# Patient Record
Sex: Female | Born: 1960 | Race: Black or African American | Hispanic: No | Marital: Single | Smoking: Current every day smoker
Health system: Southern US, Community
[De-identification: ages and names within clinical notes are randomized; demographics above are authoritative.]

## PROBLEM LIST (undated history)

## (undated) DIAGNOSIS — R911 Solitary pulmonary nodule: Secondary | ICD-10-CM

## (undated) DIAGNOSIS — I700 Atherosclerosis of aorta: Secondary | ICD-10-CM

## (undated) DIAGNOSIS — K8020 Calculus of gallbladder without cholecystitis without obstruction: Secondary | ICD-10-CM

## (undated) DIAGNOSIS — K219 Gastro-esophageal reflux disease without esophagitis: Secondary | ICD-10-CM

## (undated) DIAGNOSIS — J439 Emphysema, unspecified: Secondary | ICD-10-CM

## (undated) DIAGNOSIS — I517 Cardiomegaly: Secondary | ICD-10-CM

## (undated) DIAGNOSIS — D649 Anemia, unspecified: Secondary | ICD-10-CM

## (undated) DIAGNOSIS — N200 Calculus of kidney: Secondary | ICD-10-CM

## (undated) HISTORY — PX: LITHOTRIPSY: SUR834

---

## 2006-03-10 ENCOUNTER — Ambulatory Visit: Payer: Self-pay

## 2006-07-21 ENCOUNTER — Emergency Department: Payer: Self-pay

## 2006-07-24 ENCOUNTER — Ambulatory Visit: Payer: Self-pay | Admitting: Urology

## 2008-01-17 ENCOUNTER — Emergency Department: Payer: Self-pay | Admitting: Emergency Medicine

## 2011-01-07 ENCOUNTER — Emergency Department: Payer: Self-pay | Admitting: Emergency Medicine

## 2011-02-16 ENCOUNTER — Ambulatory Visit: Payer: Self-pay | Admitting: Orthopedic Surgery

## 2013-08-14 ENCOUNTER — Emergency Department: Payer: Self-pay | Admitting: Internal Medicine

## 2013-11-04 ENCOUNTER — Emergency Department: Payer: Self-pay | Admitting: Emergency Medicine

## 2013-11-04 LAB — URINALYSIS, COMPLETE
Bacteria: NONE SEEN
Bilirubin,UR: NEGATIVE
GLUCOSE, UR: NEGATIVE mg/dL (ref 0–75)
Ketone: NEGATIVE
Leukocyte Esterase: NEGATIVE
NITRITE: NEGATIVE
Ph: 5 (ref 4.5–8.0)
Protein: NEGATIVE
RBC,UR: 100 /HPF (ref 0–5)
Specific Gravity: 1.013 (ref 1.003–1.030)

## 2013-11-04 LAB — COMPREHENSIVE METABOLIC PANEL
ALT: 19 U/L (ref 12–78)
ANION GAP: 4 — AB (ref 7–16)
Albumin: 3.3 g/dL — ABNORMAL LOW (ref 3.4–5.0)
Alkaline Phosphatase: 87 U/L
BUN: 15 mg/dL (ref 7–18)
Bilirubin,Total: 0.3 mg/dL (ref 0.2–1.0)
Calcium, Total: 8.2 mg/dL — ABNORMAL LOW (ref 8.5–10.1)
Chloride: 111 mmol/L — ABNORMAL HIGH (ref 98–107)
Co2: 26 mmol/L (ref 21–32)
Creatinine: 0.77 mg/dL (ref 0.60–1.30)
Glucose: 100 mg/dL — ABNORMAL HIGH (ref 65–99)
Osmolality: 282 (ref 275–301)
Potassium: 3.8 mmol/L (ref 3.5–5.1)
SGOT(AST): 13 U/L — ABNORMAL LOW (ref 15–37)
Sodium: 141 mmol/L (ref 136–145)
Total Protein: 6.7 g/dL (ref 6.4–8.2)

## 2013-11-04 LAB — LIPASE, BLOOD: Lipase: 84 U/L (ref 73–393)

## 2013-11-04 LAB — CBC
HCT: 36.6 % (ref 35.0–47.0)
HGB: 12.3 g/dL (ref 12.0–16.0)
MCH: 30.6 pg (ref 26.0–34.0)
MCHC: 33.6 g/dL (ref 32.0–36.0)
MCV: 91 fL (ref 80–100)
PLATELETS: 272 10*3/uL (ref 150–440)
RBC: 4.01 10*6/uL (ref 3.80–5.20)
RDW: 13.7 % (ref 11.5–14.5)
WBC: 6.6 10*3/uL (ref 3.6–11.0)

## 2014-10-16 ENCOUNTER — Emergency Department: Payer: Self-pay | Admitting: Student

## 2014-10-18 ENCOUNTER — Emergency Department: Payer: Self-pay | Admitting: Emergency Medicine

## 2015-09-16 ENCOUNTER — Emergency Department: Payer: No Typology Code available for payment source

## 2015-09-16 ENCOUNTER — Emergency Department
Admission: EM | Admit: 2015-09-16 | Discharge: 2015-09-16 | Disposition: A | Discharge status: Home / Self Care | Payer: No Typology Code available for payment source | Attending: Emergency Medicine | Admitting: Emergency Medicine

## 2015-09-16 ENCOUNTER — Encounter: Payer: Self-pay | Admitting: Emergency Medicine

## 2015-09-16 DIAGNOSIS — R1031 Right lower quadrant pain: Secondary | ICD-10-CM | POA: Diagnosis present

## 2015-09-16 DIAGNOSIS — N10 Acute tubulo-interstitial nephritis: Secondary | ICD-10-CM | POA: Insufficient documentation

## 2015-09-16 DIAGNOSIS — F17200 Nicotine dependence, unspecified, uncomplicated: Secondary | ICD-10-CM | POA: Insufficient documentation

## 2015-09-16 DIAGNOSIS — R109 Unspecified abdominal pain: Secondary | ICD-10-CM

## 2015-09-16 DIAGNOSIS — N12 Tubulo-interstitial nephritis, not specified as acute or chronic: Secondary | ICD-10-CM

## 2015-09-16 HISTORY — DX: Calculus of kidney: N20.0

## 2015-09-16 LAB — URINALYSIS COMPLETE WITH MICROSCOPIC (ARMC ONLY)
BILIRUBIN URINE: NEGATIVE
Glucose, UA: NEGATIVE mg/dL
Ketones, ur: NEGATIVE mg/dL
NITRITE: NEGATIVE
PH: 6 (ref 5.0–8.0)
Protein, ur: NEGATIVE mg/dL
Specific Gravity, Urine: 1.015 (ref 1.005–1.030)

## 2015-09-16 LAB — CBC WITH DIFFERENTIAL/PLATELET
BASOS ABS: 0 10*3/uL (ref 0–0.1)
BASOS PCT: 0 %
Eosinophils Absolute: 0 10*3/uL (ref 0–0.7)
Eosinophils Relative: 0 %
HEMATOCRIT: 38.3 % (ref 35.0–47.0)
HEMOGLOBIN: 12.8 g/dL (ref 12.0–16.0)
Lymphocytes Relative: 15 %
Lymphs Abs: 1.2 10*3/uL (ref 1.0–3.6)
MCH: 30.2 pg (ref 26.0–34.0)
MCHC: 33.4 g/dL (ref 32.0–36.0)
MCV: 90.5 fL (ref 80.0–100.0)
Monocytes Absolute: 0.4 10*3/uL (ref 0.2–0.9)
Monocytes Relative: 5 %
NEUTROS ABS: 6.6 10*3/uL — AB (ref 1.4–6.5)
NEUTROS PCT: 80 %
Platelets: 287 10*3/uL (ref 150–440)
RBC: 4.23 MIL/uL (ref 3.80–5.20)
RDW: 14.2 % (ref 11.5–14.5)
WBC: 8.4 10*3/uL (ref 3.6–11.0)

## 2015-09-16 LAB — BASIC METABOLIC PANEL
ANION GAP: 8 (ref 5–15)
BUN: 17 mg/dL (ref 6–20)
CALCIUM: 9.3 mg/dL (ref 8.9–10.3)
CO2: 24 mmol/L (ref 22–32)
Chloride: 105 mmol/L (ref 101–111)
Creatinine, Ser: 0.84 mg/dL (ref 0.44–1.00)
Glucose, Bld: 99 mg/dL (ref 65–99)
POTASSIUM: 4.1 mmol/L (ref 3.5–5.1)
Sodium: 137 mmol/L (ref 135–145)

## 2015-09-16 MED ORDER — OXYCODONE-ACETAMINOPHEN 5-325 MG PO TABS: 1.0000 | ORAL_TABLET | Freq: Four times a day (QID) | ORAL | Status: DC | PRN

## 2015-09-16 MED ORDER — MORPHINE SULFATE (PF) 4 MG/ML IV SOLN
4.0000 mg | Freq: Once | INTRAVENOUS | Status: AC
  Administered 2015-09-16: 4 mg via INTRAVENOUS
  Filled 2015-09-16: qty 1

## 2015-09-16 MED ORDER — ACETAMINOPHEN 325 MG PO TABS
650.0000 mg | ORAL_TABLET | Freq: Once | ORAL | Status: AC
  Administered 2015-09-16: 650 mg via ORAL
  Filled 2015-09-16: qty 2

## 2015-09-16 MED ORDER — DEXTROSE 5 % IV SOLN
1.0000 g | Freq: Once | INTRAVENOUS | Status: AC
  Administered 2015-09-16: 1 g via INTRAVENOUS
  Filled 2015-09-16: qty 10

## 2015-09-16 MED ORDER — ONDANSETRON HCL 4 MG/2ML IJ SOLN
4.0000 mg | INTRAMUSCULAR | Status: AC
  Administered 2015-09-16: 4 mg via INTRAVENOUS
  Filled 2015-09-16: qty 2

## 2015-09-16 MED ORDER — KETOROLAC TROMETHAMINE 30 MG/ML IJ SOLN
30.0000 mg | Freq: Once | INTRAMUSCULAR | Status: AC
  Administered 2015-09-16: 30 mg via INTRAVENOUS
  Filled 2015-09-16: qty 1

## 2015-09-16 MED ORDER — CEPHALEXIN 500 MG PO CAPS: 500.0000 mg | ORAL_CAPSULE | Freq: Three times a day (TID) | ORAL | Status: AC

## 2015-09-16 MED ORDER — SODIUM CHLORIDE 0.9 % IV BOLUS (SEPSIS)
1000.0000 mL | Freq: Once | INTRAVENOUS | Status: AC
  Administered 2015-09-16: 1000 mL via INTRAVENOUS

## 2015-09-16 NOTE — ED Provider Notes (Signed)
[facility] Emergency Department Provider Note  ____________________________________________  Time seen: Approximately  AM  I have reviewed the triage vital signs and the nursing notes.   HISTORY  Chief Complaint Flank Pain    HPI Belinda Oneill is a 54 y.o. female with a history of kidney stones is presenting today with 3 days of right flank and right lower quadrant abdominal pain. She says that she has been nauseous earlier today but has not vomited. She describes the pain as sharp and intermittent. She also says that she is having discomfort, frequency and urgency with urination. She says that she has had kidney stones in the past and this feels similar.   Past Medical History  Diagnosis Date  . Kidney stones     There are no active problems to display for this patient.   Past Surgical History  Procedure Laterality Date  . Lithotripsy      No current outpatient prescriptions on file.  Allergies Review of patient's allergies indicates no known allergies.  No family history on file.  Social History Social History  Substance Use Topics  . Smoking status: Current Some Day Smoker -- 0.50 packs/day for 40 years  . Smokeless tobacco: None  . Alcohol Use: Yes     Comment: occ    Review of Systems Constitutional: No fever/chills Eyes: No visual changes. ENT: No sore throat. Cardiovascular: Denies chest pain. Respiratory: Denies shortness of breath. Gastrointestinal: no vomiting.  No diarrhea.  No constipation. Genitourinary: Negative for dysuria. Musculoskeletal: Right lower back pain Skin: Negative for rash. Neurological: Negative for headaches, focal weakness or numbness.  10-point ROS otherwise negative.  ____________________________________________   PHYSICAL EXAM:  VITAL SIGNS: ED Triage Vitals  Enc Vitals Group     BP 09/16/15 0504 121/77 mmHg     Pulse Rate 09/16/15 0504 98     Resp 09/16/15 0504 18     Temp 09/16/15  0504 100.3 F (37.9 C)     Temp Source 09/16/15 0504 Oral     SpO2 09/16/15 0504 100 %     Weight 09/16/15 0504 164 lb (74.39 kg)     Height 09/16/15 0504 5\' 5"  (1.651 m)     Head Cir --      Peak Flow --      Pain Score 09/16/15 0505 10     Pain Loc --      Pain Edu? --      Excl. in GC? --     Constitutional: Alert and oriented. Well appearing and in no acute distress. Eyes: Conjunctivae are normal. PERRL. EOMI. Head: Atraumatic. Nose: No congestion/rhinnorhea. Mouth/Throat: Mucous membranes are moist.   Neck: No stridor.   Cardiovascular: Normal rate, regular rhythm. Grossly normal heart sounds.  Good peripheral circulation. Respiratory: Normal respiratory effort.  No retractions. Lungs CTAB. Gastrointestinal: Soft with right lower quadrant tenderness which is mild. No distention. No abdominal bruits. Right-sided CVA tenderness. Musculoskeletal: No lower extremity tenderness nor edema.  No joint effusions. Neurologic:  Normal speech and language. No gross focal neurologic deficits are appreciated. No gait instability. Skin:  Skin is warm, dry and intact. No rash noted. Psychiatric: Mood and affect are normal. Speech and behavior are normal.  ____________________________________________   LABS (all labs ordered are listed, but only abnormal results are displayed)  Labs Reviewed  CBC WITH DIFFERENTIAL/PLATELET - Abnormal; Notable for the following:    Neutro Abs 6.6 (*)    All other components within normal limits  URINALYSIS  COMPLETEWITH MICROSCOPIC ([facility] ONLY) - Abnormal; Notable for the following:    Color, Urine YELLOW (*)    APPearance CLOUDY (*)    Hgb urine dipstick 2+ (*)    Leukocytes, UA 3+ (*)    Bacteria, UA RARE (*)    Squamous Epithelial / LPF 6-30 (*)    All other components within normal limits  BASIC METABOLIC PANEL   ____________________________________________  EKG   ____________________________________________  RADIOLOGY  Multiple stable  bilateral renal calculi. No acute abnormalities. ____________________________________________   PROCEDURES   ____________________________________________   INITIAL IMPRESSION / ASSESSMENT AND PLAN / ED COURSE  Pertinent labs & imaging results that were available during my care of the patient were reviewed by me and considered in my medical decision making (see chart for details).  ----------------------------------------- 8:56 AM on 09/16/2015 -----------------------------------------  The patient is resting comfortably at this time. I updated her about her imaging results. She'll be discharged with antibiotics for pyelonephritis. I will also give her several tablets of Percocet for pain control. She is not her primary care doctor. She'll be given the contact information for the [place] clinic. We reviewed return instructions such as any worsening or concerning symptoms especially fever or worsening pain. The patient understands the plan and is willing to comply. ____________________________________________   FINAL CLINICAL IMPRESSION(S) / ED DIAGNOSES  Final diagnoses:  Right flank pain   pyelonephritis.  Myrna Blazer, MD 09/16/15 [phone]

## 2015-09-16 NOTE — ED Notes (Signed)
Pt resting, Tylenol given for flank pain.

## 2015-09-16 NOTE — ED Notes (Signed)
Patient here with complaint of right flank pain times one week that became worse this morning. Patient reports that she has had nausea and vomiting and pain with urination. Patient states that she has had a kidney stone in the past and that this feels like a kidney stone.

## 2015-09-18 LAB — URINE CULTURE

## 2017-04-20 ENCOUNTER — Emergency Department
Admission: EM | Admit: 2017-04-20 | Discharge: 2017-04-20 | Disposition: A | Discharge status: Home / Self Care | Payer: No Typology Code available for payment source

## 2017-05-30 ENCOUNTER — Emergency Department
Admission: EM | Admit: 2017-05-30 | Discharge: 2017-05-30 | Disposition: A | Discharge status: Home / Self Care | Payer: Self-pay | Attending: Emergency Medicine | Admitting: Emergency Medicine

## 2017-05-30 ENCOUNTER — Encounter: Payer: Self-pay | Admitting: Intensive Care

## 2017-05-30 ENCOUNTER — Emergency Department: Payer: Self-pay

## 2017-05-30 DIAGNOSIS — R609 Edema, unspecified: Secondary | ICD-10-CM

## 2017-05-30 DIAGNOSIS — M545 Low back pain: Secondary | ICD-10-CM | POA: Insufficient documentation

## 2017-05-30 DIAGNOSIS — F17200 Nicotine dependence, unspecified, uncomplicated: Secondary | ICD-10-CM | POA: Insufficient documentation

## 2017-05-30 DIAGNOSIS — R600 Localized edema: Secondary | ICD-10-CM | POA: Insufficient documentation

## 2017-05-30 LAB — CBC WITH DIFFERENTIAL/PLATELET
BASOS ABS: 0.1 10*3/uL (ref 0–0.1)
Basophils Relative: 1 %
EOS ABS: 0.1 10*3/uL (ref 0–0.7)
Eosinophils Relative: 2 %
HCT: 36.8 % (ref 35.0–47.0)
Hemoglobin: 12.4 g/dL (ref 12.0–16.0)
LYMPHS ABS: 2 10*3/uL (ref 1.0–3.6)
Lymphocytes Relative: 29 %
MCH: 30.5 pg (ref 26.0–34.0)
MCHC: 33.7 g/dL (ref 32.0–36.0)
MCV: 90.6 fL (ref 80.0–100.0)
Monocytes Absolute: 0.4 10*3/uL (ref 0.2–0.9)
Monocytes Relative: 6 %
NEUTROS ABS: 4.3 10*3/uL (ref 1.4–6.5)
NEUTROS PCT: 62 %
Platelets: 305 10*3/uL (ref 150–440)
RBC: 4.07 MIL/uL (ref 3.80–5.20)
RDW: 13.7 % (ref 11.5–14.5)
WBC: 6.9 10*3/uL (ref 3.6–11.0)

## 2017-05-30 LAB — COMPREHENSIVE METABOLIC PANEL
ALT: 21 U/L (ref 14–54)
ANION GAP: 9 (ref 5–15)
AST: 21 U/L (ref 15–41)
Albumin: 3.7 g/dL (ref 3.5–5.0)
Alkaline Phosphatase: 68 U/L (ref 38–126)
BUN: 11 mg/dL (ref 6–20)
CHLORIDE: 106 mmol/L (ref 101–111)
CO2: 26 mmol/L (ref 22–32)
Calcium: 9.3 mg/dL (ref 8.9–10.3)
Creatinine, Ser: 0.71 mg/dL (ref 0.44–1.00)
GFR calc Af Amer: >60 mL/min (ref >60)
GFR calc non Af Amer: >60 mL/min (ref >60)
Glucose, Bld: 86 mg/dL (ref 65–99)
POTASSIUM: 3.8 mmol/L (ref 3.5–5.1)
SODIUM: 141 mmol/L (ref 135–145)
Total Bilirubin: 0.4 mg/dL (ref 0.3–1.2)
Total Protein: 7.2 g/dL (ref 6.5–8.1)

## 2017-05-30 LAB — URINALYSIS, COMPLETE (UACMP) WITH MICROSCOPIC
BILIRUBIN URINE: NEGATIVE
Glucose, UA: NEGATIVE mg/dL
HGB URINE DIPSTICK: NEGATIVE
KETONES UR: NEGATIVE mg/dL
NITRITE: NEGATIVE
Protein, ur: NEGATIVE mg/dL
SPECIFIC GRAVITY, URINE: 1.014 (ref 1.005–1.030)
pH: 6 (ref 5.0–8.0)

## 2017-05-30 LAB — BRAIN NATRIURETIC PEPTIDE: B Natriuretic Peptide: 23 pg/mL (ref 0.0–100.0)

## 2017-05-30 MED ORDER — MORPHINE SULFATE (PF) 4 MG/ML IV SOLN
4.0000 mg | Freq: Once | INTRAVENOUS | Status: AC
  Administered 2017-05-30: 4 mg via INTRAVENOUS
  Filled 2017-05-30: qty 1

## 2017-05-30 MED ORDER — FUROSEMIDE 40 MG PO TABS
20.0000 mg | ORAL_TABLET | Freq: Once | ORAL | Status: AC
  Administered 2017-05-30: 20 mg via ORAL
  Filled 2017-05-30: qty 1

## 2017-05-30 MED ORDER — OXYCODONE-ACETAMINOPHEN 5-325 MG PO TABS: 1.0000 | ORAL_TABLET | Freq: Three times a day (TID) | ORAL | Status: DC | PRN

## 2017-05-30 MED ORDER — OXYCODONE-ACETAMINOPHEN 5-325 MG PO TABS: 1.0000 | ORAL_TABLET | Freq: Three times a day (TID) | ORAL | 0 refills | Status: DC | PRN

## 2017-05-30 MED ORDER — FUROSEMIDE 20 MG PO TABS: 20.0000 mg | ORAL_TABLET | Freq: Every day | ORAL | 0 refills | Status: DC

## 2017-05-30 NOTE — ED Provider Notes (Signed)
[facility] Emergency Department Provider Note       Time seen: ----------------------------------------- 10:22 AM on 05/30/2017 -----------------------------------------     I have reviewed the triage vital signs and the nursing notes.   HISTORY   Chief Complaint Hip Pain and Leg Swelling    HPI Belinda Oneill is a 56 y.o. female who presents to the ED for right-sided hip pain and low back pain as well as diffuse bilateral leg swelling that began approximately week ago. Patient reports this is worsened over the last 3-4 days. She's not had any recent falls or trauma. Patient denies a history of leg swelling. She denies fevers, chills or other complaints.   Past Medical History:  Diagnosis Date  . Kidney stones     There are no active problems to display for this patient.   Past Surgical History:  Procedure Laterality Date  . LITHOTRIPSY      Allergies Patient has no known allergies.  Social History Social History  Substance Use Topics  . Smoking status: Current Every Day Smoker    Packs/day: 0.50    Years: 40.00  . Smokeless tobacco: Not on file  . Alcohol use Yes     Comment: occ    Review of Systems Constitutional: Negative for fever. Eyes: Negative for vision changes ENT:  Negative for congestion, sore throat Cardiovascular: Negative for chest pain. Respiratory: Negative for shortness of breath. Gastrointestinal: Negative for abdominal pain, vomiting and diarrhea. Genitourinary: Negative for dysuria. Musculoskeletal: Positive for hip pain and leg swelling Skin: Positive for leg erythema Neurological: Negative for headaches, focal weakness or numbness.  All systems negative/normal/unremarkable except as stated in the HPI  ____________________________________________   PHYSICAL EXAM:  VITAL SIGNS: ED Triage Vitals  Enc Vitals Group     BP 05/30/17 0949 116/74     Pulse Rate 05/30/17 0949 79     Resp 05/30/17 0949  16     Temp 05/30/17 0949 98.1 F (36.7 C)     Temp Source 05/30/17 0949 Oral     SpO2 05/30/17 0949 100 %     Weight 05/30/17 0950 156 lb (70.8 kg)     Height 05/30/17 0950 5\' 4"  (1.626 m)     Head Circumference --      Peak Flow --      Pain Score 05/30/17 0949 10     Pain Loc --      Pain Edu? --      Excl. in GC? --     Constitutional: Alert and oriented. Well appearing and in no distress. Eyes: Conjunctivae are normal. Normal extraocular movements. ENT   Head: Normocephalic and atraumatic.   Nose: No congestion/rhinnorhea.   Mouth/Throat: Mucous membranes are moist.   Neck: No stridor. Cardiovascular: Normal rate, regular rhythm. No murmurs, rubs, or gallops. Respiratory: Normal respiratory effort without tachypnea nor retractions. Breath sounds are clear and equal bilaterally. No wheezes/rales/rhonchi. Gastrointestinal: Soft and nontender. Normal bowel sounds Musculoskeletal: Diffuse bilateral pitting edema in both legs. There is erythema in both legs medially, particularly around the popliteal fossa Neurologic:  Normal speech and language. No gross focal neurologic deficits are appreciated.  Skin:  Edema and erythema of the legs as dictated above Psychiatric: Mood and affect are normal. Speech and behavior are normal.  ____________________________________________  ED COURSE:  Pertinent labs & imaging results that were available during my care of the patient were reviewed by me and considered in my medical decision making (see chart for details).  Patient presents for flank pain and leg swelling, we will assess with labs and imaging as indicated.   Procedures ____________________________________________   LABS (pertinent positives/negatives)  Labs Reviewed  URINALYSIS, COMPLETE (UACMP) WITH MICROSCOPIC - Abnormal; Notable for the following:       Result Value   Color, Urine YELLOW (*)    APPearance CLEAR (*)    Leukocytes, UA MODERATE (*)    Bacteria,  UA MANY (*)    Squamous Epithelial / LPF 0-5 (*)    All other components within normal limits  CBC WITH DIFFERENTIAL/PLATELET  COMPREHENSIVE METABOLIC PANEL  BRAIN NATRIURETIC PEPTIDE    RADIOLOGY  US venous IMPRESSION: No evidence of DVT within either lower extremity. IMPRESSION: No acute abnormality to the pelvis or right hip. ____________________________________________  FINAL ASSESSMENT AND PLAN  Edema  Plan: Patient's labs and imaging were dictated above. Patient had presented for edema of uncertain etiology. Ultrasound and labs are reassuring. I will prescribed a diuretic at low dose for her to try. She is stable for outpatient follow-up with her doctor.   Emily FilbertWilliams, Campbell Agramonte E, MD   Note: This note was generated in part or whole with voice recognition software. Voice recognition is usually quite accurate but there are transcription errors that can and very often do occur. I apologize for any typographical errors that were not detected and corrected.     Emily FilbertWilliams, Anyla Israelson E, MD 05/30/17 [phone]

## 2017-05-30 NOTE — ED Notes (Signed)
Pt is unsteady during ambulation. Pt states she has had episodes of edema in her legs previously and it is typically a result of her working 2 jobs and being on her feet too much. Pt states has been wearing compression stockings for the past few days and they have helped.

## 2017-05-30 NOTE — ED Triage Notes (Signed)
Patient presents today with L sided hip pain and leg swelling that began 3-4 days ago. No previous falls. No history of leg swelling. Denies chest pain or SOB.

## 2018-01-12 ENCOUNTER — Emergency Department
Admission: EM | Admit: 2018-01-12 | Discharge: 2018-01-12 | Disposition: A | Discharge status: Home / Self Care | Payer: No Typology Code available for payment source | Attending: Emergency Medicine | Admitting: Emergency Medicine

## 2018-01-12 ENCOUNTER — Emergency Department: Payer: No Typology Code available for payment source

## 2018-01-12 ENCOUNTER — Encounter: Payer: Self-pay | Admitting: Emergency Medicine

## 2018-01-12 ENCOUNTER — Other Ambulatory Visit: Payer: Self-pay

## 2018-01-12 DIAGNOSIS — N12 Tubulo-interstitial nephritis, not specified as acute or chronic: Secondary | ICD-10-CM

## 2018-01-12 DIAGNOSIS — F17210 Nicotine dependence, cigarettes, uncomplicated: Secondary | ICD-10-CM | POA: Insufficient documentation

## 2018-01-12 DIAGNOSIS — Z79899 Other long term (current) drug therapy: Secondary | ICD-10-CM | POA: Insufficient documentation

## 2018-01-12 LAB — BASIC METABOLIC PANEL
Anion gap: 13 (ref 5–15)
BUN: 20 mg/dL (ref 6–20)
CHLORIDE: 99 mmol/L — AB (ref 101–111)
CO2: 23 mmol/L (ref 22–32)
CREATININE: 1 mg/dL (ref 0.44–1.00)
Calcium: 9.4 mg/dL (ref 8.9–10.3)
GFR calc non Af Amer: >60 mL/min (ref >60)
Glucose, Bld: 94 mg/dL (ref 65–99)
POTASSIUM: 4.1 mmol/L (ref 3.5–5.1)
SODIUM: 135 mmol/L (ref 135–145)

## 2018-01-12 LAB — URINALYSIS, COMPLETE (UACMP) WITH MICROSCOPIC
Bilirubin Urine: NEGATIVE
GLUCOSE, UA: NEGATIVE mg/dL
Hgb urine dipstick: NEGATIVE
Ketones, ur: NEGATIVE mg/dL
Nitrite: NEGATIVE
PH: 6 (ref 5.0–8.0)
Protein, ur: NEGATIVE mg/dL
Specific Gravity, Urine: 1.019 (ref 1.005–1.030)

## 2018-01-12 LAB — CBC
HEMATOCRIT: 43.8 % (ref 35.0–47.0)
HEMOGLOBIN: 14.5 g/dL (ref 12.0–16.0)
MCH: 29.6 pg (ref 26.0–34.0)
MCHC: 33.1 g/dL (ref 32.0–36.0)
MCV: 89.4 fL (ref 80.0–100.0)
Platelets: 347 10*3/uL (ref 150–440)
RBC: 4.89 MIL/uL (ref 3.80–5.20)
RDW: 14.2 % (ref 11.5–14.5)
WBC: 4.7 10*3/uL (ref 3.6–11.0)

## 2018-01-12 MED ORDER — CEPHALEXIN 500 MG PO CAPS: 500.0000 mg | ORAL_CAPSULE | Freq: Four times a day (QID) | ORAL | 0 refills | Status: AC

## 2018-01-12 MED ORDER — SODIUM CHLORIDE 0.9 % IV SOLN
1.0000 g | Freq: Once | INTRAVENOUS | Status: AC
  Administered 2018-01-12: 1 g via INTRAVENOUS
  Filled 2018-01-12: qty 10

## 2018-01-12 MED ORDER — FENTANYL CITRATE (PF) 100 MCG/2ML IJ SOLN
50.0000 ug | INTRAMUSCULAR | Status: AC | PRN
Start: 2018-01-12 — End: 2018-01-12
  Administered 2018-01-12 (×2): 50 ug via INTRAVENOUS
  Filled 2018-01-12 (×2): qty 2

## 2018-01-12 MED ORDER — SODIUM CHLORIDE 0.9 % IV BOLUS (SEPSIS)
1000.0000 mL | Freq: Once | INTRAVENOUS | Status: AC
  Administered 2018-01-12: 1000 mL via INTRAVENOUS

## 2018-01-12 MED ORDER — ONDANSETRON HCL 4 MG/2ML IJ SOLN
4.0000 mg | Freq: Once | INTRAMUSCULAR | Status: AC
  Administered 2018-01-12: 4 mg via INTRAVENOUS
  Filled 2018-01-12: qty 2

## 2018-01-12 NOTE — ED Provider Notes (Signed)
[facility] Emergency Department Provider Note  ____________________________________________   First MD Initiated Contact with Patient 01/12/18 1821     (approximate)  I have reviewed the triage vital signs and the nursing notes.   HISTORY  Chief Complaint Flank Pain   HPI Belinda Oneill is a 57 y.o. female who self presents to the emergency department with 2 days of moderate to severe constant throbbing left flank pain.  Nonradiating.  She has a history of previous kidney stones and is concerned this could be recurrent.  She denies fevers or chills.  She does report some dysuria although no frequency or hesitance.  Her pain is moderate to severe throbbing aching constant.  Nothing seems to make it better or worse.  Past Medical History:  Diagnosis Date  . Kidney stones   . Kidney stones     There are no active problems to display for this patient.   Past Surgical History:  Procedure Laterality Date  . LITHOTRIPSY      Prior to Admission medications   Medication Sig Start Date End Date Taking? Authorizing Provider  cephALEXin (KEFLEX) 500 MG capsule Take 1 capsule (500 mg total) by mouth 4 (four) times daily for 10 days. 01/12/18 01/22/18  Merrily Brittle, MD  furosemide (LASIX) 20 MG tablet Take 1 tablet (20 mg total) by mouth daily. 05/30/17 05/30/18  Emily Filbert, MD  oxyCODONE-acetaminophen (PERCOCET) 5-325 MG tablet Take 1-2 tablets by mouth every 8 (eight) hours as needed. 05/30/17   Emily Filbert, MD  oxyCODONE-acetaminophen (ROXICET) 5-325 MG tablet Take 1-2 tablets by mouth every 6 (six) hours as needed. Patient not taking: Reported on 05/30/2017 09/16/15   Schaevitz, Myra Rude, MD    Allergies Patient has no known allergies.  No family history on file.  Social History Social History   Tobacco Use  . Smoking status: Current Every Day Smoker    Packs/day: 0.25    Years: 40.00    Pack years: 10.00    Types: Cigarettes    Substance Use Topics  . Alcohol use: Yes    Comment: occ  . Drug use: No    Review of Systems Constitutional: No fever/chills Eyes: No visual changes. ENT: No sore throat. Cardiovascular: Denies chest pain. Respiratory: Denies shortness of breath. Gastrointestinal: Positive for abdominal pain.  Positive for nausea, no vomiting.  No diarrhea.  No constipation. Genitourinary: Positive for dysuria. Musculoskeletal: Positive for back pain. Skin: Negative for rash. Neurological: Negative for headaches, focal weakness or numbness.   ____________________________________________   PHYSICAL EXAM:  VITAL SIGNS: ED Triage Vitals  Enc Vitals Group     BP 01/12/18 1617 117/80     Pulse Rate 01/12/18 1617 93     Resp 01/12/18 1617 20     Temp 01/12/18 1617 98.8 F (37.1 C)     Temp Source 01/12/18 1617 Oral     SpO2 01/12/18 1617 100 %     Weight 01/12/18 1618 165 lb (74.8 kg)     Height 01/12/18 1618 5' 4.5" (1.638 m)     Head Circumference --      Peak Flow --      Pain Score 01/12/18 1618 9     Pain Loc --      Pain Edu? --      Excl. in GC? --     Constitutional: Alert and oriented x4 appears uncomfortable holding her left flank tearful Eyes: PERRL EOMI. Head: Atraumatic. Nose: No congestion/rhinnorhea. Mouth/Throat:  No trismus Neck: No stridor.   Cardiovascular: Normal rate, regular rhythm. Grossly normal heart sounds.  Good peripheral circulation. Respiratory: Normal respiratory effort.  No retractions. Lungs CTAB and moving good air Gastrointestinal: Soft nontender no rebound no guarding no peritonitis.  Positive left greater than right costovertebral tenderness Musculoskeletal: No lower extremity edema   Neurologic:  Normal speech and language. No gross focal neurologic deficits are appreciated. Skin:  Skin is warm, dry and intact. No rash noted. Psychiatric: Anxious appearing    ____________________________________________   DIFFERENTIAL includes but not  limited to  Renal colic, pyelonephritis, urinary tract infection, infected stone ____________________________________________   LABS (all labs ordered are listed, but only abnormal results are displayed)  Labs Reviewed  URINALYSIS, COMPLETE (UACMP) WITH MICROSCOPIC - Abnormal; Notable for the following components:      Result Value   Color, Urine YELLOW (*)    APPearance HAZY (*)    Leukocytes, UA SMALL (*)    Bacteria, UA MANY (*)    Squamous Epithelial / LPF 0-5 (*)    All other components within normal limits  BASIC METABOLIC PANEL - Abnormal; Notable for the following components:   Chloride 99 (*)    All other components within normal limits  URINE CULTURE  CBC    Urinalysis consistent with infection __________________________________________  EKG   ____________________________________________  RADIOLOGY  CT scan abdomen pelvis stone protocol reviewed by me with nephrolithiasis although no ureteral lithiasis ____________________________________________   PROCEDURES  Procedure(s) performed: no  Procedures  Critical Care performed: no  Observation: no ____________________________________________   INITIAL IMPRESSION / ASSESSMENT AND PLAN / ED COURSE  Pertinent labs & imaging results that were available during my care of the patient were reviewed by me and considered in my medical decision making (see chart for details).  The patient arrives uncomfortable appearing with left flank pain and history of renal colic.  IV fentanyl given for the patient's pain which did improve her symptoms although she had some sort of a dysphoric reaction to the fentanyl and does not want anymore.  Her urinalysis is more consistent with infection and stone and as such CT scan will be obtained to evaluate for possible infected stone.  A gram of ceftriaxone and urine culture are pending.  The patient CT scan does show kidney stones but none that have left the kidney.  No  hydronephrosis or other suggestion of small stone.  At this point I do believe the patient's symptoms are most consistent with pyelonephritis and not nephrolithiasis.  Her pain is adequately controlled.  I will treat her with Keflex for 10 days and primary care follow-up.  Strict return precautions have been given and the patient verbalizes understanding and agreement with the plan.      ____________________________________________   FINAL CLINICAL IMPRESSION(S) / ED DIAGNOSES  Final diagnoses:  Pyelonephritis      NEW MEDICATIONS STARTED DURING THIS VISIT:  Discharge Medication List as of 01/12/2018  8:27 PM    START taking these medications   Details  cephALEXin (KEFLEX) 500 MG capsule Take 1 capsule (500 mg total) by mouth 4 (four) times daily for 10 days., Starting Mon 01/12/2018, Until Thu 01/22/2018, Print         Note:  This document was prepared using Dragon voice recognition software and may include unintentional dictation errors.     Merrily Brittleifenbark, Audy Dauphine, MD 01/13/18 2226

## 2018-01-12 NOTE — ED Triage Notes (Signed)
L flank pain x 2 days. History of kidney stones. Feels same. Denies fevers

## 2018-01-12 NOTE — Discharge Instructions (Signed)
Please make an appointment to follow-up with primary care within the next 2 days for reevaluation.  Take all of your antibiotics as prescribed for a full 10 days.  Return to the emergency department sooner for any new or worsening symptoms such as fevers, chills, worsening pain, or for any other issues whatsoever.  It was a pleasure to take care of you today, and thank you for coming to our emergency department.  If you have any questions or concerns before leaving please ask the nurse to grab me and I'm more than happy to go through your aftercare instructions again.  If you were prescribed any opioid pain medication today such as Norco, Vicodin, Percocet, morphine, hydrocodone, or oxycodone please make sure you do not drive when you are taking this medication as it can alter your ability to drive safely.  If you have any concerns once you are home that you are not improving or are in fact getting worse before you can make it to your follow-up appointment, please do not hesitate to call 911 and come back for further evaluation.  Merrily Brittle, MD  Results for orders placed or performed during the hospital encounter of 01/12/18  Urinalysis, Complete w Microscopic  Result Value Ref Range   Color, Urine YELLOW (A) YELLOW   APPearance HAZY (A) CLEAR   Specific Gravity, Urine 1.019 1.005 - 1.030   pH 6.0 5.0 - 8.0   Glucose, UA NEGATIVE NEGATIVE mg/dL   Hgb urine dipstick NEGATIVE NEGATIVE   Bilirubin Urine NEGATIVE NEGATIVE   Ketones, ur NEGATIVE NEGATIVE mg/dL   Protein, ur NEGATIVE NEGATIVE mg/dL   Nitrite NEGATIVE NEGATIVE   Leukocytes, UA SMALL (A) NEGATIVE   RBC / HPF 0-5 0 - 5 RBC/hpf   WBC, UA TOO NUMEROUS TO COUNT 0 - 5 WBC/hpf   Bacteria, UA MANY (A) NONE SEEN   Squamous Epithelial / LPF 0-5 (A) NONE SEEN   Mucus PRESENT   CBC  Result Value Ref Range   WBC 4.7 3.6 - 11.0 K/uL   RBC 4.89 3.80 - 5.20 MIL/uL   Hemoglobin 14.5 12.0 - 16.0 g/dL   HCT 16.1 09.6 - 04.5 %   MCV 89.4  80.0 - 100.0 fL   MCH 29.6 26.0 - 34.0 pg   MCHC 33.1 32.0 - 36.0 g/dL   RDW 40.9 81.1 - 91.4 %   Platelets 347 150 - 440 K/uL  Basic metabolic panel  Result Value Ref Range   Sodium 135 135 - 145 mmol/L   Potassium 4.1 3.5 - 5.1 mmol/L   Chloride 99 (L) 101 - 111 mmol/L   CO2 23 22 - 32 mmol/L   Glucose, Bld 94 65 - 99 mg/dL   BUN 20 6 - 20 mg/dL   Creatinine, Ser 7.82 0.44 - 1.00 mg/dL   Calcium 9.4 8.9 - 95.6 mg/dL   GFR calc non Af Amer >60 >60 mL/min   GFR calc Af Amer >60 >60 mL/min   Anion gap 13 5 - 15   Ct Renal Stone Study  Result Date: 01/12/2018 CLINICAL DATA:  57 year old female with left flank pain for 2 days. Initial encounter. EXAM: CT ABDOMEN AND PELVIS WITHOUT CONTRAST TECHNIQUE: Multidetector CT imaging of the abdomen and pelvis was performed following the standard protocol without IV contrast. COMPARISON:  09/16/2015 CT. FINDINGS: Lower chest: No worrisome lung base abnormality. Heart size within normal limits. Hepatobiliary: Top-normal size liver. Taking into account limitation by non contrast imaging, no worrisome hepatic lesion. Suspect  noncalcified gallstone. Pancreas: Taking into account limitation by non contrast imaging, no worrisome pancreatic mass or inflammation. Spleen: Taking into account limitation by non contrast imaging, no splenic mass or enlargement. Adrenals/Urinary Tract: No ureteral or renal obstructing stone or hydronephrosis. Multiple bilateral nonobstructing renal calculi. Taking into account limitation by non contrast imaging, no worrisome renal or adrenal mass. Noncontrast filled imaging of the urinary bladder without abnormality noted. Stomach/Bowel: Scattered colonic diverticula without evidence of extraluminal bowel inflammatory process. Specifically, no inflammation surrounds the appendix or terminal ileum. No gastric or small bowel abnormality noted. Vascular/Lymphatic: Atherosclerotic changes aorta without aneurysm. Atherosclerotic changes iliac  arteries. Scattered normal size lymph nodes without adenopathy. Reproductive: Uterus tilted to the right. No uterine or adnexal mass identified. Other: No free air or bowel containing hernia. Stable appearance of nonspecific 1.2 cm eggshell calcification left lower pelvis. Musculoskeletal: No worrisome osseous lesion. L4-5 bilateral facet degenerative changes. Mild L4-5 bulge. IMPRESSION: Multiple bilateral nonobstructing renal calculi. No evidence of obstructing stone or hydronephrosis. Suspect noncalcified gallstone. Aortic Atherosclerosis (ICD10-I70.0). L4-5 bilateral facet degenerative changes.  Mild bulge L4-5 level. Electronically Signed   By: Lacy DuverneySteven  Olson M.D.   On: 01/12/2018 18:53

## 2018-01-15 ENCOUNTER — Emergency Department: Payer: Self-pay

## 2018-01-15 ENCOUNTER — Emergency Department
Admission: EM | Admit: 2018-01-15 | Discharge: 2018-01-15 | Disposition: A | Discharge status: Home / Self Care | Payer: Self-pay | Attending: Emergency Medicine | Admitting: Emergency Medicine

## 2018-01-15 ENCOUNTER — Encounter: Payer: Self-pay | Admitting: Emergency Medicine

## 2018-01-15 DIAGNOSIS — R109 Unspecified abdominal pain: Secondary | ICD-10-CM | POA: Insufficient documentation

## 2018-01-15 DIAGNOSIS — F17210 Nicotine dependence, cigarettes, uncomplicated: Secondary | ICD-10-CM | POA: Insufficient documentation

## 2018-01-15 DIAGNOSIS — R1110 Vomiting, unspecified: Secondary | ICD-10-CM | POA: Insufficient documentation

## 2018-01-15 LAB — URINALYSIS, COMPLETE (UACMP) WITH MICROSCOPIC
BILIRUBIN URINE: NEGATIVE
Bacteria, UA: NONE SEEN
GLUCOSE, UA: NEGATIVE mg/dL
Ketones, ur: NEGATIVE mg/dL
NITRITE: NEGATIVE
PH: 5 (ref 5.0–8.0)
Protein, ur: NEGATIVE mg/dL
SPECIFIC GRAVITY, URINE: 1.02 (ref 1.005–1.030)

## 2018-01-15 LAB — BASIC METABOLIC PANEL
Anion gap: 10 (ref 5–15)
BUN: 13 mg/dL (ref 6–20)
CHLORIDE: 105 mmol/L (ref 101–111)
CO2: 25 mmol/L (ref 22–32)
Calcium: 10 mg/dL (ref 8.9–10.3)
Creatinine, Ser: 0.88 mg/dL (ref 0.44–1.00)
GFR calc Af Amer: >60 mL/min (ref >60)
GFR calc non Af Amer: >60 mL/min (ref >60)
Glucose, Bld: 92 mg/dL (ref 65–99)
POTASSIUM: 3.6 mmol/L (ref 3.5–5.1)
Sodium: 140 mmol/L (ref 135–145)

## 2018-01-15 LAB — URINE CULTURE: Culture: >=100000 — AB

## 2018-01-15 LAB — CBC
HEMATOCRIT: 44.3 % (ref 35.0–47.0)
Hemoglobin: 14.9 g/dL (ref 12.0–16.0)
MCH: 30 pg (ref 26.0–34.0)
MCHC: 33.7 g/dL (ref 32.0–36.0)
MCV: 89.3 fL (ref 80.0–100.0)
Platelets: 338 10*3/uL (ref 150–440)
RBC: 4.97 MIL/uL (ref 3.80–5.20)
RDW: 14.1 % (ref 11.5–14.5)
WBC: 4.9 10*3/uL (ref 3.6–11.0)

## 2018-01-15 MED ORDER — ONDANSETRON 4 MG PO TBDP: 4.0000 mg | ORAL_TABLET | Freq: Three times a day (TID) | ORAL | 0 refills | Status: DC | PRN

## 2018-01-15 MED ORDER — OXYCODONE-ACETAMINOPHEN 5-325 MG PO TABS: 1.0000 | ORAL_TABLET | Freq: Three times a day (TID) | ORAL | 0 refills | Status: DC | PRN

## 2018-01-15 MED ORDER — OXYCODONE-ACETAMINOPHEN 5-325 MG PO TABS: 2.0000 | ORAL_TABLET | Freq: Once | ORAL | Status: DC

## 2018-01-15 MED ORDER — OXYCODONE-ACETAMINOPHEN 5-325 MG PO TABS
2.0000 | ORAL_TABLET | Freq: Once | ORAL | Status: AC
  Administered 2018-01-15: 2 via ORAL
  Filled 2018-01-15: qty 2

## 2018-01-15 NOTE — ED Notes (Signed)
Patient transported to X-ray 

## 2018-01-15 NOTE — ED Notes (Signed)
Seen here Monday with Kidney infection , uncontrolled pain

## 2018-01-15 NOTE — ED Triage Notes (Signed)
Pt comes into the ED via POV c/o left flank pain.  Patient seen here Monday and informed she had pylonephritis.  Patient states the pain has moved but still on the left and she is unable to keep her pain under control.  Patient in NAD at this time. Patient has been taking her antibiotic as prescribed.

## 2018-01-15 NOTE — ED Provider Notes (Signed)
[facility] Emergency Department Provider Note       Time seen: ----------------------------------------- 2:58 PM on 01/15/2018 -----------------------------------------   I have reviewed the triage vital signs and the nursing notes.  HISTORY   Chief Complaint Flank Pain    HPI Belinda Oneill is a 57 y.o. female with a history of kidney stones who presents to the ED for left flank pain.  Patient was seen here Monday and inform she had pyelonephritis and started taking antibiotics.  Patient is currently taking Keflex.  Subsequent urine culture grew out Klebsiella that was sensitive to cephalosporins.  Patient states the pain is moved but still is on the left side and she is unable to keep her pain under control.  She denies fevers, chills or other complaints.  Past Medical History:  Diagnosis Date  . Kidney stones   . Kidney stones     There are no active problems to display for this patient.   Past Surgical History:  Procedure Laterality Date  . LITHOTRIPSY      Allergies Patient has no known allergies.  Social History Social History   Tobacco Use  . Smoking status: Current Every Day Smoker    Packs/day: 0.25    Years: 40.00    Pack years: 10.00    Types: Cigarettes  . Smokeless tobacco: Never Used  Substance Use Topics  . Alcohol use: Yes    Comment: occ  . Drug use: No    Review of Systems Constitutional: Negative for fever. Cardiovascular: Negative for chest pain. Respiratory: Negative for shortness of breath. Gastrointestinal: Positive for flank pain and vomiting Genitourinary: Negative for dysuria. Musculoskeletal: Negative for back pain. Skin: Negative for rash. Neurological: Negative for headaches, focal weakness or numbness.  All systems negative/normal/unremarkable except as stated in the HPI  ____________________________________________   PHYSICAL EXAM:  VITAL SIGNS: ED Triage Vitals  Enc Vitals Group     BP  01/15/18 1216 114/72     Pulse Rate 01/15/18 1216 82     Resp 01/15/18 1216 18     Temp 01/15/18 1216 98.2 F (36.8 C)     Temp Source 01/15/18 1216 Oral     SpO2 01/15/18 1216 100 %     Weight 01/15/18 1216 160 lb (72.6 kg)     Height 01/15/18 1216 5' 4.5" (1.638 m)     Head Circumference --      Peak Flow --      Pain Score 01/15/18 1225 10     Pain Loc --      Pain Edu? --      Excl. in GC? --    Constitutional: Alert and oriented. Well appearing and in no distress. Eyes: Conjunctivae are normal. Normal extraocular movements. ENT   Head: Normocephalic and atraumatic.   Nose: No congestion/rhinnorhea.   Mouth/Throat: Mucous membranes are moist.   Neck: No stridor. Cardiovascular: Normal rate, regular rhythm. No murmurs, rubs, or gallops. Respiratory: Normal respiratory effort without tachypnea nor retractions. Breath sounds are clear and equal bilaterally. No wheezes/rales/rhonchi. Gastrointestinal: Left flank tenderness, normal bowel sounds. Musculoskeletal: Nontender with normal range of motion in extremities. No lower extremity tenderness nor edema. Neurologic:  Normal speech and language. No gross focal neurologic deficits are appreciated.  Skin:  Skin is warm, dry and intact. No rash noted. Psychiatric: Mood and affect are normal. Speech and behavior are normal.  ____________________________________________  ED COURSE:  As part of my medical decision making, I reviewed the following data within  the electronic MEDICAL RECORD NUMBER History obtained from family if available, nursing notes, old chart and ekg, as well as notes from prior ED visits. Patient presented for flank pain, we will assess with labs and imaging as indicated at this time.   Procedures ____________________________________________   LABS (pertinent positives/negatives)  Labs Reviewed  URINALYSIS, COMPLETE (UACMP) WITH MICROSCOPIC - Abnormal; Notable for the following components:      Result  Value   Color, Urine YELLOW (*)    APPearance HAZY (*)    Hgb urine dipstick SMALL (*)    Leukocytes, UA TRACE (*)    Squamous Epithelial / LPF 6-30 (*)    All other components within normal limits  BASIC METABOLIC PANEL  CBC    RADIOLOGY  KUB is unremarkable for acute process  ____________________________________________  DIFFERENTIAL DIAGNOSIS   Muscular pain, pyelonephritis, renal colic, shingles  FINAL ASSESSMENT AND PLAN  Flank pain   Plan: The patient had presented for flank pain with recent pyelonephritis. Patient's labs reveal improving UTI versus pyelonephritis. Patient's imaging not reveal any acute process.  She will be discharged with a short supply pain medicine and encouraged to continue her antibiotics as written.   Johnathan E WUlice Dashilliams, MD   Note: This note was generated in part or whole with voice recognition software. Voice recognition is usually quite accurate but there are transcription errors that can and very often do occur. I apologize for any typographical errors that were not detected and corrected.     Emily FilbertWilliams, Sosaia Pittinger E, MD 01/15/18 1500

## 2018-07-20 ENCOUNTER — Encounter: Payer: Self-pay | Admitting: Emergency Medicine

## 2018-07-20 ENCOUNTER — Emergency Department: Payer: Self-pay

## 2018-07-20 ENCOUNTER — Other Ambulatory Visit: Payer: Self-pay

## 2018-07-20 ENCOUNTER — Emergency Department
Admission: EM | Admit: 2018-07-20 | Discharge: 2018-07-20 | Disposition: A | Discharge status: Home / Self Care | Payer: Self-pay | Attending: Emergency Medicine | Admitting: Emergency Medicine

## 2018-07-20 DIAGNOSIS — K5900 Constipation, unspecified: Secondary | ICD-10-CM | POA: Insufficient documentation

## 2018-07-20 DIAGNOSIS — R1030 Lower abdominal pain, unspecified: Secondary | ICD-10-CM | POA: Insufficient documentation

## 2018-07-20 DIAGNOSIS — R1084 Generalized abdominal pain: Secondary | ICD-10-CM

## 2018-07-20 DIAGNOSIS — N3000 Acute cystitis without hematuria: Secondary | ICD-10-CM | POA: Insufficient documentation

## 2018-07-20 DIAGNOSIS — F17210 Nicotine dependence, cigarettes, uncomplicated: Secondary | ICD-10-CM | POA: Insufficient documentation

## 2018-07-20 LAB — RAPID HIV SCREEN (HIV 1/2 AB+AG)
HIV 1/2 Antibodies: NONREACTIVE
HIV-1 P24 ANTIGEN - HIV24: NONREACTIVE

## 2018-07-20 LAB — URINALYSIS, COMPLETE (UACMP) WITH MICROSCOPIC
Bilirubin Urine: NEGATIVE
Glucose, UA: NEGATIVE mg/dL
Hgb urine dipstick: NEGATIVE
Ketones, ur: NEGATIVE mg/dL
Nitrite: NEGATIVE
PROTEIN: NEGATIVE mg/dL
Specific Gravity, Urine: 1.024 (ref 1.005–1.030)
pH: 5 (ref 5.0–8.0)

## 2018-07-20 LAB — CHLAMYDIA/NGC RT PCR (ARMC ONLY)
Chlamydia Tr: NOT DETECTED
N GONORRHOEAE: NOT DETECTED

## 2018-07-20 LAB — CBC WITH DIFFERENTIAL/PLATELET
BASOS ABS: 0.1 10*3/uL (ref 0–0.1)
Basophils Relative: 1 %
EOS PCT: 2 %
Eosinophils Absolute: 0.1 10*3/uL (ref 0–0.7)
HEMATOCRIT: 41.2 % (ref 35.0–47.0)
HEMOGLOBIN: 14.5 g/dL (ref 12.0–16.0)
LYMPHS ABS: 1.4 10*3/uL (ref 1.0–3.6)
LYMPHS PCT: 35 %
MCH: 32.2 pg (ref 26.0–34.0)
MCHC: 35.2 g/dL (ref 32.0–36.0)
MCV: 91.6 fL (ref 80.0–100.0)
Monocytes Absolute: 0.4 10*3/uL (ref 0.2–0.9)
Monocytes Relative: 9 %
Neutro Abs: 2.2 10*3/uL (ref 1.4–6.5)
Neutrophils Relative %: 53 %
Platelets: 280 10*3/uL (ref 150–440)
RBC: 4.5 MIL/uL (ref 3.80–5.20)
RDW: 13.5 % (ref 11.5–14.5)
WBC: 4 10*3/uL (ref 3.6–11.0)

## 2018-07-20 LAB — COMPREHENSIVE METABOLIC PANEL
ALBUMIN: 4.3 g/dL (ref 3.5–5.0)
ALK PHOS: 83 U/L (ref 38–126)
ALT: 16 U/L (ref 0–44)
AST: 17 U/L (ref 15–41)
Anion gap: 10 (ref 5–15)
BILIRUBIN TOTAL: 0.3 mg/dL (ref 0.3–1.2)
BUN: 20 mg/dL (ref 6–20)
CALCIUM: 9.4 mg/dL (ref 8.9–10.3)
CO2: 25 mmol/L (ref 22–32)
CREATININE: 0.79 mg/dL (ref 0.44–1.00)
Chloride: 108 mmol/L (ref 98–111)
GFR calc Af Amer: >60 mL/min (ref >60)
GFR calc non Af Amer: >60 mL/min (ref >60)
GLUCOSE: 74 mg/dL (ref 70–99)
Potassium: 4.2 mmol/L (ref 3.5–5.1)
Sodium: 143 mmol/L (ref 135–145)
TOTAL PROTEIN: 7.7 g/dL (ref 6.5–8.1)

## 2018-07-20 LAB — WET PREP, GENITAL
Sperm: NONE SEEN
Trich, Wet Prep: NONE SEEN
YEAST WET PREP: NONE SEEN

## 2018-07-20 LAB — LIPASE, BLOOD: Lipase: 27 U/L (ref 11–51)

## 2018-07-20 LAB — POC URINE PREG, ED: PREG TEST UR: NEGATIVE

## 2018-07-20 MED ORDER — POLYETHYLENE GLYCOL 3350 17 G PO PACK: 17.0000 g | PACK | Freq: Every day | ORAL | 0 refills | Status: DC

## 2018-07-20 MED ORDER — CIPROFLOXACIN HCL 500 MG PO TABS: 500.0000 mg | ORAL_TABLET | Freq: Two times a day (BID) | ORAL | 0 refills | Status: AC

## 2018-07-20 MED ORDER — AZITHROMYCIN 500 MG PO TABS
1000.0000 mg | ORAL_TABLET | Freq: Once | ORAL | Status: AC
  Administered 2018-07-20: 1000 mg via ORAL
  Filled 2018-07-20: qty 2

## 2018-07-20 MED ORDER — OXYCODONE-ACETAMINOPHEN 5-325 MG PO TABS
2.0000 | ORAL_TABLET | Freq: Once | ORAL | Status: AC
  Administered 2018-07-20: 2 via ORAL
  Filled 2018-07-20: qty 2

## 2018-07-20 MED ORDER — SODIUM CHLORIDE 0.9 % IV SOLN
1.0000 g | Freq: Once | INTRAVENOUS | Status: AC
  Administered 2018-07-20: 1 g via INTRAVENOUS
  Filled 2018-07-20: qty 10

## 2018-07-20 NOTE — ED Provider Notes (Signed)
[facility] Emergency Department Provider Note       Time seen: ----------------------------------------- 8:10 AM on 07/20/2018 -----------------------------------------   I have reviewed the triage vital signs and the nursing notes.  HISTORY   Chief Complaint Abdominal Pain    HPI Belinda Oneill is a 57 y.o. female with a history of kidney stones who presents to the ED for low back pain and lower abdominal pain for the past month.  Patient states the pain was so severe at work today she needed to come here for evaluation.  She describes the pain is intermittent but over the past week has become more constant.  She has had decreased bowel movements but does not think she is constipated.  She denies any dysuria.  Past Medical History:  Diagnosis Date  . Kidney stones   . Kidney stones     There are no active problems to display for this patient.   Past Surgical History:  Procedure Laterality Date  . LITHOTRIPSY      Allergies Patient has no known allergies.  Social History Social History   Tobacco Use  . Smoking status: Current Every Day Smoker    Packs/day: 0.25    Years: 40.00    Pack years: 10.00    Types: Cigarettes  . Smokeless tobacco: Never Used  Substance Use Topics  . Alcohol use: Yes    Comment: occ  . Drug use: No   Review of Systems Constitutional: Negative for fever. Cardiovascular: Negative for chest pain. Respiratory: Negative for shortness of breath. Gastrointestinal: Positive for abdominal pain, constipation Genitourinary: Negative for dysuria. Musculoskeletal: Negative for back pain. Skin: Negative for rash. Neurological: Negative for headaches, focal weakness or numbness.  All systems negative/normal/unremarkable except as stated in the HPI  ____________________________________________   PHYSICAL EXAM:  VITAL SIGNS: ED Triage Vitals  Enc Vitals Group     BP 07/20/18 0746 122/78     Pulse Rate 07/20/18  0746 93     Resp 07/20/18 0746 16     Temp 07/20/18 0746 98.7 F (37.1 C)     Temp Source 07/20/18 0746 Oral     SpO2 07/20/18 0746 97 %     Weight --      Height --      Head Circumference --      Peak Flow --      Pain Score 07/20/18 0745 10     Pain Loc --      Pain Edu? --      Excl. in GC? --    Constitutional: Alert and oriented. Well appearing and in no distress. Eyes: Conjunctivae are normal. Normal extraocular movements. Cardiovascular: Normal rate, regular rhythm. No murmurs, rubs, or gallops. Respiratory: Normal respiratory effort without tachypnea nor retractions. Breath sounds are clear and equal bilaterally. No wheezes/rales/rhonchi. Gastrointestinal: Soft and nontender.  Mildly distended.  Normal bowel sounds. Genitourinary: White vaginal discharge, cervical motion tenderness. Musculoskeletal: Nontender with normal range of motion in extremities. No lower extremity tenderness nor edema. Neurologic:  Normal speech and language. No gross focal neurologic deficits are appreciated.  Skin:  Skin is warm, dry and intact. No rash noted. Psychiatric: Mood and affect are normal. Speech and behavior are normal.  ____________________________________________  ED COURSE:  As part of my medical decision making, I reviewed the following data within the electronic MEDICAL RECORD NUMBER History obtained from family if available, nursing notes, old chart and ekg, as well as notes from prior ED visits. Patient presented  for abdominal pain, we will assess with labs and imaging as indicated at this time.   Procedures ____________________________________________   LABS (pertinent positives/negatives)  Labs Reviewed  WET PREP, GENITAL - Abnormal; Notable for the following components:      Result Value   Clue Cells Wet Prep HPF POC PRESENT (*)    WBC, Wet Prep HPF POC FEW (*)    All other components within normal limits  URINALYSIS, COMPLETE (UACMP) WITH MICROSCOPIC - Abnormal; Notable  for the following components:   Color, Urine YELLOW (*)    APPearance HAZY (*)    Leukocytes, UA TRACE (*)    Bacteria, UA RARE (*)    All other components within normal limits  CHLAMYDIA/NGC RT PCR ([facility] ONLY)  CBC WITH DIFFERENTIAL/PLATELET  COMPREHENSIVE METABOLIC PANEL  LIPASE, BLOOD  RAPID HIV SCREEN (HIV 1/2 AB+AG)  HIV ANTIBODY (ROUTINE TESTING W REFLEX)  POC URINE PREG, ED    RADIOLOGY Images were viewed by me  Abdomen 2 view IMPRESSION: Bilateral nephrolithiasis. ____________________________________________  DIFFERENTIAL DIAGNOSIS   Constipation, UTI, pyelonephritis, renal colic, PID, gas pain, chronic pain  FINAL ASSESSMENT AND PLAN  Abdominal pain, urinary tract infection, constipation   Plan: The patient had presented for abdominal pain. Patient's labs did reveal likely UTI. Patient's imaging was negative for any acute process although she does have intrarenal kidney stones, no extrarenal stones are identified.  She does not have hematuria.  On pelvic examination she was very tender and had vaginal discharge, she was started on Rocephin and azithromycin here.  She be discharged home with Cipro as well as MiraLAX.  She is stable for outpatient follow-up.   Ulice Dash, MD   Note: This note was generated in part or whole with voice recognition software. Voice recognition is usually quite accurate but there are transcription errors that can and very often do occur. I apologize for any typographical errors that were not detected and corrected.     Emily Filbert, MD 07/20/18 1048

## 2018-07-20 NOTE — ED Triage Notes (Signed)
C/O low back and low abdominal pain x 1 month.  Describes pain as intermittent, but over past week pain has been more constant.  Denies dysuria.  Last BM 9/21, but states bowels have not been regular for the past month.

## 2018-07-21 LAB — HIV ANTIBODY (ROUTINE TESTING W REFLEX): HIV SCREEN 4TH GENERATION: NONREACTIVE

## 2019-05-20 ENCOUNTER — Other Ambulatory Visit: Payer: Self-pay

## 2019-05-20 DIAGNOSIS — Z20822 Contact with and (suspected) exposure to covid-19: Secondary | ICD-10-CM

## 2019-05-23 LAB — NOVEL CORONAVIRUS, NAA: SARS-CoV-2, NAA: NOT DETECTED

## 2019-08-09 ENCOUNTER — Emergency Department
Admission: EM | Admit: 2019-08-09 | Discharge: 2019-08-09 | Disposition: A | Discharge status: Home / Self Care | Payer: BC Managed Care – PPO | Attending: Emergency Medicine | Admitting: Emergency Medicine

## 2019-08-09 ENCOUNTER — Encounter: Payer: Self-pay | Admitting: Emergency Medicine

## 2019-08-09 ENCOUNTER — Emergency Department: Payer: BC Managed Care – PPO

## 2019-08-09 DIAGNOSIS — F17210 Nicotine dependence, cigarettes, uncomplicated: Secondary | ICD-10-CM | POA: Insufficient documentation

## 2019-08-09 DIAGNOSIS — R112 Nausea with vomiting, unspecified: Secondary | ICD-10-CM | POA: Diagnosis present

## 2019-08-09 DIAGNOSIS — R0789 Other chest pain: Secondary | ICD-10-CM | POA: Insufficient documentation

## 2019-08-09 DIAGNOSIS — R079 Chest pain, unspecified: Secondary | ICD-10-CM

## 2019-08-09 LAB — URINALYSIS, COMPLETE (UACMP) WITH MICROSCOPIC
Bilirubin Urine: NEGATIVE
Glucose, UA: NEGATIVE mg/dL
Hgb urine dipstick: NEGATIVE
Ketones, ur: NEGATIVE mg/dL
Leukocytes,Ua: NEGATIVE
Nitrite: NEGATIVE
Protein, ur: 30 mg/dL — AB
Specific Gravity, Urine: 1.024 (ref 1.005–1.030)
pH: 5 (ref 5.0–8.0)

## 2019-08-09 LAB — COMPREHENSIVE METABOLIC PANEL
ALT: 22 U/L (ref 0–44)
AST: 24 U/L (ref 15–41)
Albumin: 4.5 g/dL (ref 3.5–5.0)
Alkaline Phosphatase: 71 U/L (ref 38–126)
Anion gap: 11 (ref 5–15)
BUN: 29 mg/dL — ABNORMAL HIGH (ref 6–20)
CO2: 26 mmol/L (ref 22–32)
Calcium: 9.8 mg/dL (ref 8.9–10.3)
Chloride: 99 mmol/L (ref 98–111)
Creatinine, Ser: 0.85 mg/dL (ref 0.44–1.00)
GFR calc Af Amer: >60 mL/min (ref >60)
GFR calc non Af Amer: >60 mL/min (ref >60)
Glucose, Bld: 135 mg/dL — ABNORMAL HIGH (ref 70–99)
Potassium: 3.3 mmol/L — ABNORMAL LOW (ref 3.5–5.1)
Sodium: 136 mmol/L (ref 135–145)
Total Bilirubin: 0.6 mg/dL (ref 0.3–1.2)
Total Protein: 8.6 g/dL — ABNORMAL HIGH (ref 6.5–8.1)

## 2019-08-09 LAB — CBC
HCT: 44.1 % (ref 36.0–46.0)
Hemoglobin: 14.8 g/dL (ref 12.0–15.0)
MCH: 30.1 pg (ref 26.0–34.0)
MCHC: 33.6 g/dL (ref 30.0–36.0)
MCV: 89.8 fL (ref 80.0–100.0)
Platelets: 356 10*3/uL (ref 150–400)
RBC: 4.91 MIL/uL (ref 3.87–5.11)
RDW: 12.8 % (ref 11.5–15.5)
WBC: 6.2 10*3/uL (ref 4.0–10.5)
nRBC: 0 % (ref 0.0–0.2)

## 2019-08-09 LAB — LIPASE, BLOOD: Lipase: 22 U/L (ref 11–51)

## 2019-08-09 LAB — TROPONIN I (HIGH SENSITIVITY): Troponin I (High Sensitivity): 3 ng/L (ref <18)

## 2019-08-09 MED ORDER — PANTOPRAZOLE SODIUM 40 MG PO TBEC: 40.0000 mg | DELAYED_RELEASE_TABLET | Freq: Every day | ORAL | 1 refills | Status: DC

## 2019-08-09 MED ORDER — SODIUM CHLORIDE 0.9% FLUSH: 3.0000 mL | Freq: Once | INTRAVENOUS | Status: DC

## 2019-08-09 MED ORDER — ONDANSETRON HCL 4 MG/2ML IJ SOLN
INTRAMUSCULAR | Status: AC
  Filled 2019-08-09: qty 2

## 2019-08-09 MED ORDER — SODIUM CHLORIDE 0.9 % IV BOLUS
1000.0000 mL | Freq: Once | INTRAVENOUS | Status: AC
  Administered 2019-08-09: 1000 mL via INTRAVENOUS

## 2019-08-09 MED ORDER — LIDOCAINE VISCOUS HCL 2 % MT SOLN
15.0000 mL | Freq: Once | OROMUCOSAL | Status: AC
  Administered 2019-08-09: 15 mL via ORAL
  Filled 2019-08-09: qty 15

## 2019-08-09 MED ORDER — ONDANSETRON HCL 4 MG/2ML IJ SOLN
4.0000 mg | Freq: Once | INTRAMUSCULAR | Status: AC
  Administered 2019-08-09: 4 mg via INTRAVENOUS

## 2019-08-09 MED ORDER — ALUM & MAG HYDROXIDE-SIMETH 200-200-20 MG/5ML PO SUSP
30.0000 mL | Freq: Once | ORAL | Status: AC
  Administered 2019-08-09: 30 mL via ORAL
  Filled 2019-08-09: qty 30

## 2019-08-09 NOTE — ED Triage Notes (Signed)
Pt here with c/o vomiting that began Saturday, states unable to keep anything down, feels dehydrated, states burning in her chest when she lays down, no hx of reflux. Denies fever, denies diarrhea. NAD.

## 2019-08-09 NOTE — ED Notes (Signed)
Pt states she also has a cough that started last Thursday but states "it's not covid because I tested negative in July."

## 2019-08-09 NOTE — ED Provider Notes (Signed)
[facility] Emergency Department Provider Note  Time seen: 2:45 PM  I have reviewed the triage vital signs and the nursing notes.   HISTORY  Chief Complaint Emesis and Abdominal Pain (lower abd pain)   HPI Belinda Oneill is a 58 y.o. female with no significant past medical history presents to the emergency department for nausea vomiting.  According to the patient  over the past 2 days she has been nauseated with intermittent vomiting.  States she has not been able to keep anything down today.  Describes a feeling of discomfort of burning in her chest, but denies any abdominal pain.  States it feels like reflux but denies any significant reflux history states very rarely she will get heartburn.  Denies any shortness of breath cough or fever.  Describes the burning as mild/minimal currently.  Past Medical History:  Diagnosis Date  . Kidney stones   . Kidney stones     There are no active problems to display for this patient.   Past Surgical History:  Procedure Laterality Date  . LITHOTRIPSY      Prior to Admission medications   Medication Sig Start Date End Date Taking? Authorizing Provider  furosemide (LASIX) 20 MG tablet Take 1 tablet (20 mg total) by mouth daily. 05/30/17 05/30/18  Earleen Newport, MD  ondansetron (ZOFRAN ODT) 4 MG disintegrating tablet Take 1 tablet (4 mg total) by mouth every 8 (eight) hours as needed for nausea or vomiting. 01/15/18   Earleen Newport, MD  oxyCODONE-acetaminophen (PERCOCET) 5-325 MG tablet Take 1-2 tablets by mouth every 8 (eight) hours as needed. 05/30/17   Earleen Newport, MD  oxyCODONE-acetaminophen (PERCOCET) 5-325 MG tablet Take 1-2 tablets by mouth every 8 (eight) hours as needed for severe pain. 01/15/18   Earleen Newport, MD  oxyCODONE-acetaminophen (ROXICET) 5-325 MG tablet Take 1-2 tablets by mouth every 6 (six) hours as needed. Patient not taking: Reported on 05/30/2017 09/16/15   Orbie Pyo, MD  polyethylene glycol [facility] / Floria Raveling) packet Take 17 g by mouth daily. 07/20/18   Earleen Newport, MD    No Known Allergies  No family history on file.  Social History Social History   Tobacco Use  . Smoking status: Current Every Day Smoker    Packs/day: 0.25    Years: 40.00    Pack years: 10.00    Types: Cigarettes  . Smokeless tobacco: Never Used  Substance Use Topics  . Alcohol use: Yes    Comment: occ  . Drug use: No    Review of Systems Constitutional: Negative for fever. Cardiovascular: Positive for burning sensation in the chest, mild Respiratory: Negative for shortness of breath.  Negative for cough. Gastrointestinal: Negative for abdominal pain Musculoskeletal: Negative for musculoskeletal complaints Neurological: Negative for headache All other ROS negative  ____________________________________________   PHYSICAL EXAM:  VITAL SIGNS: ED Triage Vitals [08/09/19 1343]  Enc Vitals Group     BP (!) 144/125     Pulse Rate (!) 103     Resp 18     Temp 98.8 F (37.1 C)     Temp Source Oral     SpO2 97 %     Weight      Height      Head Circumference      Peak Flow      Pain Score      Pain Loc      Pain Edu?      Excl. in  GC?    Constitutional: Alert and oriented. Well appearing and in no distress. Eyes: Normal exam ENT      Head: Normocephalic and atraumatic.      Mouth/Throat: Mucous membranes are moist. Cardiovascular: Normal rate, regular rhythm.  Respiratory: Normal respiratory effort without tachypnea nor retractions. Breath sounds are clear Gastrointestinal: Soft and nontender. No distention.   Musculoskeletal: Nontender with normal range of motion in all extremities.  Neurologic:  Normal speech and language. No gross focal neurologic deficits  Skin:  Skin is warm, dry and intact.  Psychiatric: Mood and affect are normal.   ____________________________________________    EKG  EKG viewed and interpreted by myself  shows a normal sinus rhythm at 99 bpm with a narrow QRS, normal axis, normal intervals, no concerning ST changes.  ____________________________________________    RADIOLOGY  X-rays negative  ____________________________________________   INITIAL IMPRESSION / ASSESSMENT AND PLAN / ED COURSE  Pertinent labs & imaging results that were available during my care of the patient were reviewed by me and considered in my medical decision making (see chart for details).   Patient presents emergency department for chest pain intermittent over the past 2 days along with nausea vomiting.  Differential would include gastric reflux, ACS, chest wall discomfort, gallbladder dysfunction or pancreatitis.  We will check labs, treat nausea and IV hydrate while awaiting results.  We will also dose a GI cocktail.  Patient agreeable to plan of care.  Patient is feeling better.  Labs are normal including negative troponin.  Chest x-ray is negative.  Overall the patient appears quite well.  We will discharge home with PCP follow-up.  Discussed my normal chest pain return precautions.  Belinda Oneill was evaluated in Emergency Department on 08/09/2019 for the symptoms described in the history of present illness. She was evaluated in the context of the global COVID-19 pandemic, which necessitated consideration that the patient might be at risk for infection with the SARS-CoV-2 virus that causes COVID-19. Institutional protocols and algorithms that pertain to the evaluation of patients at risk for COVID-19 are in a state of rapid change based on information released by regulatory bodies including the CDC and federal and state organizations. These policies and algorithms were followed during the patient's care in the ED.  ____________________________________________   FINAL CLINICAL IMPRESSION(S) / ED DIAGNOSES  Chest pain   Minna Antis, MD 08/09/19 1521

## 2020-02-13 ENCOUNTER — Emergency Department
Admission: EM | Admit: 2020-02-13 | Discharge: 2020-02-13 | Disposition: A | Discharge status: Home / Self Care | Payer: Self-pay | Attending: Emergency Medicine | Admitting: Emergency Medicine

## 2020-02-13 ENCOUNTER — Emergency Department: Payer: Self-pay

## 2020-02-13 ENCOUNTER — Other Ambulatory Visit: Payer: Self-pay

## 2020-02-13 DIAGNOSIS — R112 Nausea with vomiting, unspecified: Secondary | ICD-10-CM | POA: Insufficient documentation

## 2020-02-13 DIAGNOSIS — F17210 Nicotine dependence, cigarettes, uncomplicated: Secondary | ICD-10-CM | POA: Insufficient documentation

## 2020-02-13 DIAGNOSIS — R509 Fever, unspecified: Secondary | ICD-10-CM | POA: Insufficient documentation

## 2020-02-13 DIAGNOSIS — Z79899 Other long term (current) drug therapy: Secondary | ICD-10-CM | POA: Insufficient documentation

## 2020-02-13 DIAGNOSIS — U071 COVID-19: Secondary | ICD-10-CM | POA: Insufficient documentation

## 2020-02-13 LAB — COMPREHENSIVE METABOLIC PANEL
ALT: 23 U/L (ref 0–44)
AST: 40 U/L (ref 15–41)
Albumin: 3.4 g/dL — ABNORMAL LOW (ref 3.5–5.0)
Alkaline Phosphatase: 67 U/L (ref 38–126)
Anion gap: 10 (ref 5–15)
BUN: 11 mg/dL (ref 6–20)
CO2: 22 mmol/L (ref 22–32)
Calcium: 8.9 mg/dL (ref 8.9–10.3)
Chloride: 98 mmol/L (ref 98–111)
Creatinine, Ser: 0.77 mg/dL (ref 0.44–1.00)
GFR calc Af Amer: >60 mL/min (ref >60)
GFR calc non Af Amer: >60 mL/min (ref >60)
Glucose, Bld: 108 mg/dL — ABNORMAL HIGH (ref 70–99)
Potassium: 3.5 mmol/L (ref 3.5–5.1)
Sodium: 130 mmol/L — ABNORMAL LOW (ref 135–145)
Total Bilirubin: 0.7 mg/dL (ref 0.3–1.2)
Total Protein: 7.6 g/dL (ref 6.5–8.1)

## 2020-02-13 LAB — CBC
HCT: 39.4 % (ref 36.0–46.0)
Hemoglobin: 14 g/dL (ref 12.0–15.0)
MCH: 30 pg (ref 26.0–34.0)
MCHC: 35.5 g/dL (ref 30.0–36.0)
MCV: 84.4 fL (ref 80.0–100.0)
Platelets: 250 10*3/uL (ref 150–400)
RBC: 4.67 MIL/uL (ref 3.87–5.11)
RDW: 12.7 % (ref 11.5–15.5)
WBC: 3.4 10*3/uL — ABNORMAL LOW (ref 4.0–10.5)
nRBC: 0 % (ref 0.0–0.2)

## 2020-02-13 LAB — LIPASE, BLOOD: Lipase: 23 U/L (ref 11–51)

## 2020-02-13 MED ORDER — PREDNISONE 10 MG PO TABS: 10.0000 mg | ORAL_TABLET | Freq: Every day | ORAL | 0 refills | Status: DC

## 2020-02-13 MED ORDER — ONDANSETRON HCL 4 MG/2ML IJ SOLN
4.0000 mg | Freq: Once | INTRAMUSCULAR | Status: AC
  Administered 2020-02-13: 4 mg via INTRAVENOUS
  Filled 2020-02-13: qty 2

## 2020-02-13 MED ORDER — ONDANSETRON 4 MG PO TBDP: 4.0000 mg | ORAL_TABLET | Freq: Three times a day (TID) | ORAL | 0 refills | Status: DC | PRN

## 2020-02-13 MED ORDER — DEXAMETHASONE SODIUM PHOSPHATE 10 MG/ML IJ SOLN
10.0000 mg | Freq: Once | INTRAMUSCULAR | Status: AC
  Administered 2020-02-13: 10 mg via INTRAVENOUS
  Filled 2020-02-13: qty 1

## 2020-02-13 MED ORDER — ACETAMINOPHEN 325 MG PO TABS
650.0000 mg | ORAL_TABLET | Freq: Once | ORAL | Status: AC
  Administered 2020-02-13: 650 mg via ORAL
  Filled 2020-02-13: qty 2

## 2020-02-13 MED ORDER — SODIUM CHLORIDE 0.9 % IV BOLUS
1000.0000 mL | Freq: Once | INTRAVENOUS | Status: AC
  Administered 2020-02-13: 1000 mL via INTRAVENOUS

## 2020-02-13 NOTE — ED Triage Notes (Signed)
Patient reports out of quarantine yesterday for COVID.  Reports on Saturday feeling nauseated, with vomiting, hot and cold chills.

## 2020-02-13 NOTE — ED Provider Notes (Signed)
[facility] Emergency Department Provider Note  Time seen: 8:08 AM  I have reviewed the triage vital signs and the nursing notes.   HISTORY  Chief Complaint Nausea   HPI Belinda Oneill is a 59 y.o. female with no significant past medical history presents to the emergency department for nausea vomiting and continued low-grade fever.  According to the patient she was diagnosed with Covid 02/06/2020 however her symptoms began several days before that.  Patient states her quarantine officially ended yesterday for the past 2 to 3 days she has continued to be nauseated with occasional vomiting and has continued to have subjective fever.  Found to have a low-grade fever in the emergency department.  Patient denies any shortness of breath states she never had a significant cough.  Patient's major concern is she thought she could be dehydrated so she came to the emergency department.   Past Medical History:  Diagnosis Date  . Kidney stones   . Kidney stones     There are no problems to display for this patient.   Past Surgical History:  Procedure Laterality Date  . LITHOTRIPSY      Prior to Admission medications   Medication Sig Start Date End Date Taking? Authorizing Provider  furosemide (LASIX) 20 MG tablet Take 1 tablet (20 mg total) by mouth daily. 05/30/17 05/30/18  Emily Filbert, MD  ondansetron (ZOFRAN ODT) 4 MG disintegrating tablet Take 1 tablet (4 mg total) by mouth every 8 (eight) hours as needed for nausea or vomiting. 01/15/18   Emily Filbert, MD  oxyCODONE-acetaminophen (PERCOCET) 5-325 MG tablet Take 1-2 tablets by mouth every 8 (eight) hours as needed. 05/30/17   Emily Filbert, MD  oxyCODONE-acetaminophen (PERCOCET) 5-325 MG tablet Take 1-2 tablets by mouth every 8 (eight) hours as needed for severe pain. 01/15/18   Emily Filbert, MD  oxyCODONE-acetaminophen (ROXICET) 5-325 MG tablet Take 1-2 tablets by mouth every 6 (six) hours as  needed. Patient not taking: Reported on 05/30/2017 09/16/15   Myrna Blazer, MD  pantoprazole (PROTONIX) 40 MG tablet Take 1 tablet (40 mg total) by mouth daily. 08/09/19 08/08/20  Minna Antis, MD  polyethylene glycol (MIRALAX / Ethelene Hal) packet Take 17 g by mouth daily. 07/20/18   Emily Filbert, MD    No Known Allergies  No family history on file.  Social History Social History   Tobacco Use  . Smoking status: Current Every Day Smoker    Packs/day: 0.25    Years: 40.00    Pack years: 10.00    Types: Cigarettes  . Smokeless tobacco: Never Used  Substance Use Topics  . Alcohol use: Yes    Comment: occ  . Drug use: No    Review of Systems Constitutional: Low-grade fevers Cardiovascular: Negative for chest pain. Respiratory: Negative for shortness of breath.  Gastrointestinal: Negative for abdominal pain.  Positive for nausea vomiting. Genitourinary: Negative for urinary compaints Musculoskeletal: Negative for musculoskeletal complaints Neurological: Negative for headache All other ROS negative  ____________________________________________   PHYSICAL EXAM:  VITAL SIGNS: ED Triage Vitals [02/13/20 0447]  Enc Vitals Group     BP 91/60     Pulse Rate (!) 108     Resp 18     Temp (!) 100.6 F (38.1 C)     Temp Source Oral     SpO2 92 %     Weight      Height      Head Circumference  Peak Flow      Pain Score 8     Pain Loc      Pain Edu?      Excl. in [place]?    Constitutional: Alert and oriented. Well appearing and in no distress. Eyes: Normal exam ENT      Head: Normocephalic and atraumatic.      Nose: Mild congestion      Mouth/Throat: Mucous membranes are moist. Cardiovascular: Normal rate, regular rhythm. Respiratory: Normal respiratory effort without tachypnea nor retractions. Breath sounds are clear without wheeze rales or rhonchi. Gastrointestinal: Soft and nontender. No distention. Musculoskeletal: Nontender with normal  range of motion in all extremities. Neurologic:  Normal speech and language. No gross focal neurologic deficits  Skin:  Skin is warm, dry and intact.  Psychiatric: Mood and affect are normal.   ____________________________________________   RADIOLOGY  X-ray shows bilateral opacities suspicious for COVID-19.  ____________________________________________   INITIAL IMPRESSION / ASSESSMENT AND PLAN / ED COURSE  Pertinent labs & imaging results that were available during my care of the patient were reviewed by me and considered in my medical decision making (see chart for details).   Patient presents emergency department with continued fevers nausea vomiting, states her quarantine officially ended yesterday.  Given the low-grade fever and continued symptoms I discussed with the patient that the quarantine needs to be continually extended 3 days from her last fever.  Patient understands.  Patient has clear lung sounds without any wheeze rales or rhonchi, nontender/benign abdominal exam.  Overall patient appears well.  We will obtain a chest x-ray, IV hydrate treat nausea medication and dose Decadron.  Patient agreeable to plan of care.  Patient's x-ray shows bilateral opacities consistent with COVID-19.  Lab work largely [place].  We will place the patient on a prednisone taper as well as Zofran to be used as needed.  Patient agreeable to plan of care.  Will follow up with her doctor.  Discussed quarantine/isolation.  Belinda Oneill was evaluated in Emergency Department on 02/13/2020 for the symptoms described in the history of present illness. She was evaluated in the context of the global COVID-19 pandemic, which necessitated consideration that the patient might be at risk for infection with the SARS-CoV-2 virus that causes COVID-19. Institutional protocols and algorithms that pertain to the evaluation of patients at risk for COVID-19 are in a state of rapid change based on information  released by regulatory bodies including the CDC and federal and state organizations. These policies and algorithms were followed during the patient's care in the ED.  ____________________________________________   FINAL CLINICAL IMPRESSION(S) / ED DIAGNOSES  COVID-19 Nausea vomiting   Harvest Dark, MD 02/13/20 [phone]

## 2020-04-02 ENCOUNTER — Emergency Department: Payer: BC Managed Care – PPO

## 2020-04-02 ENCOUNTER — Emergency Department
Admission: EM | Admit: 2020-04-02 | Discharge: 2020-04-02 | Disposition: A | Discharge status: Home / Self Care | Payer: BC Managed Care – PPO | Attending: Student | Admitting: Student

## 2020-04-02 ENCOUNTER — Other Ambulatory Visit: Payer: Self-pay

## 2020-04-02 DIAGNOSIS — F17210 Nicotine dependence, cigarettes, uncomplicated: Secondary | ICD-10-CM | POA: Insufficient documentation

## 2020-04-02 DIAGNOSIS — R42 Dizziness and giddiness: Secondary | ICD-10-CM

## 2020-04-02 DIAGNOSIS — Z8616 Personal history of COVID-19: Secondary | ICD-10-CM | POA: Insufficient documentation

## 2020-04-02 DIAGNOSIS — R000 Tachycardia, unspecified: Secondary | ICD-10-CM | POA: Insufficient documentation

## 2020-04-02 DIAGNOSIS — R918 Other nonspecific abnormal finding of lung field: Secondary | ICD-10-CM | POA: Insufficient documentation

## 2020-04-02 DIAGNOSIS — F1290 Cannabis use, unspecified, uncomplicated: Secondary | ICD-10-CM | POA: Insufficient documentation

## 2020-04-02 DIAGNOSIS — R55 Syncope and collapse: Secondary | ICD-10-CM | POA: Insufficient documentation

## 2020-04-02 DIAGNOSIS — F1490 Cocaine use, unspecified, uncomplicated: Secondary | ICD-10-CM | POA: Diagnosis not present

## 2020-04-02 LAB — URINE DRUG SCREEN, QUALITATIVE (ARMC ONLY)
Amphetamines, Ur Screen: NOT DETECTED
Barbiturates, Ur Screen: NOT DETECTED
Benzodiazepine, Ur Scrn: NOT DETECTED
Cannabinoid 50 Ng, Ur ~~LOC~~: POSITIVE — AB
Cocaine Metabolite,Ur ~~LOC~~: POSITIVE — AB
MDMA (Ecstasy)Ur Screen: NOT DETECTED
Methadone Scn, Ur: NOT DETECTED
Opiate, Ur Screen: NOT DETECTED
Phencyclidine (PCP) Ur S: NOT DETECTED
Tricyclic, Ur Screen: NOT DETECTED

## 2020-04-02 LAB — CBC
HCT: 44.9 % (ref 36.0–46.0)
Hemoglobin: 15.2 g/dL — ABNORMAL HIGH (ref 12.0–15.0)
MCH: 30.5 pg (ref 26.0–34.0)
MCHC: 33.9 g/dL (ref 30.0–36.0)
MCV: 90 fL (ref 80.0–100.0)
Platelets: 364 10*3/uL (ref 150–400)
RBC: 4.99 MIL/uL (ref 3.87–5.11)
RDW: 14.7 % (ref 11.5–15.5)
WBC: 5.3 10*3/uL (ref 4.0–10.5)
nRBC: 0 % (ref 0.0–0.2)

## 2020-04-02 LAB — BASIC METABOLIC PANEL
Anion gap: 13 (ref 5–15)
BUN: 21 mg/dL — ABNORMAL HIGH (ref 6–20)
CO2: 23 mmol/L (ref 22–32)
Calcium: 10 mg/dL (ref 8.9–10.3)
Chloride: 98 mmol/L (ref 98–111)
Creatinine, Ser: 0.88 mg/dL (ref 0.44–1.00)
GFR calc Af Amer: >60 mL/min (ref >60)
GFR calc non Af Amer: >60 mL/min (ref >60)
Glucose, Bld: 117 mg/dL — ABNORMAL HIGH (ref 70–99)
Potassium: 3.5 mmol/L (ref 3.5–5.1)
Sodium: 134 mmol/L — ABNORMAL LOW (ref 135–145)

## 2020-04-02 LAB — TROPONIN I (HIGH SENSITIVITY)
Troponin I (High Sensitivity): 4 ng/L (ref <18)
Troponin I (High Sensitivity): 4 ng/L (ref <18)

## 2020-04-02 LAB — HEPATIC FUNCTION PANEL
ALT: 24 U/L (ref 0–44)
AST: 26 U/L (ref 15–41)
Albumin: 4.6 g/dL (ref 3.5–5.0)
Alkaline Phosphatase: 74 U/L (ref 38–126)
Bilirubin, Direct: 0.1 mg/dL (ref 0.0–0.2)
Indirect Bilirubin: 0.7 mg/dL (ref 0.3–0.9)
Total Bilirubin: 0.8 mg/dL (ref 0.3–1.2)
Total Protein: 8.7 g/dL — ABNORMAL HIGH (ref 6.5–8.1)

## 2020-04-02 LAB — URINALYSIS, COMPLETE (UACMP) WITH MICROSCOPIC
Bacteria, UA: NONE SEEN
Bilirubin Urine: NEGATIVE
Glucose, UA: NEGATIVE mg/dL
Hgb urine dipstick: NEGATIVE
Ketones, ur: 5 mg/dL — AB
Leukocytes,Ua: NEGATIVE
Nitrite: NEGATIVE
Protein, ur: 30 mg/dL — AB
Specific Gravity, Urine: 1.024 (ref 1.005–1.030)
pH: 5 (ref 5.0–8.0)

## 2020-04-02 MED ORDER — IOHEXOL 350 MG/ML SOLN
75.0000 mL | Freq: Once | INTRAVENOUS | Status: AC | PRN
  Administered 2020-04-02: 75 mL via INTRAVENOUS

## 2020-04-02 MED ORDER — FAMOTIDINE 20 MG PO TABS
20.0000 mg | ORAL_TABLET | Freq: Two times a day (BID) | ORAL | 0 refills | Status: DC
Start: 2020-04-02 — End: 2024-02-17

## 2020-04-02 NOTE — ED Triage Notes (Addendum)
First RN: Pt c/o of having a dizzy spell at work where she became diaphoretic and felt like she had heart burn. A&O, ambulatory.

## 2020-04-02 NOTE — ED Notes (Signed)
Peripheral IV discontinued. Catheter intact. No signs of infiltration or redness. Gauze applied to IV site.   Discharge instructions reviewed with patient. Questions fielded by this RN. Patient verbalizes understanding of instructions. Patient discharged home in stable condition per monks. No acute distress noted at time of discharge.   Pt ambulatory to DC, pt has snacks and drink to go as requested

## 2020-04-02 NOTE — ED Notes (Signed)
Pt given phone again and second message from mother to "call me", pt given fluids and graham and PB,  Pt requesting work note and EDP messaged

## 2020-04-02 NOTE — ED Provider Notes (Signed)
[facility] Emergency Department Provider Note  ____________________________________________   First MD Initiated Contact with Patient 04/02/20 1630     (approximate)  I have reviewed the triage vital signs and the nursing notes.  History  Chief Complaint Dizziness    HPI Belinda Oneill is a 59 y.o. female with history of kidney stones, COVID in April who presents to the emergency department for an episode of indigestion, lightheadedness, and dizziness.  Patient states all of a sudden she had a "spell" in which she developed a severe indigestion type pain in her central chest.  This was associated with sweatiness, lightheadedness, feeling as if she was going to pass out.  She denies any complete syncope.  Denies any palpitations.  She sat under an air conditioning vent which helped cool her down with moderate improvement in her symptoms.  Denies any history of VTE.  No leg swelling.  Diagnosed with COVID-19 a few months ago.   Past Medical Hx Past Medical History:  Diagnosis Date  . Kidney stones   . Kidney stones     Problem List There are no problems to display for this patient.   Past Surgical Hx Past Surgical History:  Procedure Laterality Date  . LITHOTRIPSY      Medications Prior to Admission medications   Medication Sig Start Date End Date Taking? Authorizing Provider  furosemide (LASIX) 20 MG tablet Take 1 tablet (20 mg total) by mouth daily. 05/30/17 05/30/18  Emily Filbert, MD  ondansetron (ZOFRAN ODT) 4 MG disintegrating tablet Take 1 tablet (4 mg total) by mouth every 8 (eight) hours as needed for nausea or vomiting. 02/13/20   Minna Antis, MD  oxyCODONE-acetaminophen (PERCOCET) 5-325 MG tablet Take 1-2 tablets by mouth every 8 (eight) hours as needed. 05/30/17   Emily Filbert, MD  oxyCODONE-acetaminophen (PERCOCET) 5-325 MG tablet Take 1-2 tablets by mouth every 8 (eight) hours as needed for severe pain. 01/15/18    Emily Filbert, MD  oxyCODONE-acetaminophen (ROXICET) 5-325 MG tablet Take 1-2 tablets by mouth every 6 (six) hours as needed. Patient not taking: Reported on 05/30/2017 09/16/15   Myrna Blazer, MD  pantoprazole (PROTONIX) 40 MG tablet Take 1 tablet (40 mg total) by mouth daily. 08/09/19 08/08/20  Minna Antis, MD  polyethylene glycol (MIRALAX / Ethelene Hal) packet Take 17 g by mouth daily. 07/20/18   Emily Filbert, MD  predniSONE (DELTASONE) 10 MG tablet Take 1 tablet (10 mg total) by mouth daily. Day 1-3: take 4 tablets PO daily Day 4-6: take 3 tablets PO daily Day 7-9: take 2 tablets PO daily Day 10-12: take 1 tablet PO daily 02/13/20   Minna Antis, MD    Allergies Patient has no known allergies.  Family Hx History reviewed. No pertinent family history.  Social Hx Social History   Tobacco Use  . Smoking status: Current Every Day Smoker    Packs/day: 0.25    Years: 40.00    Pack years: 10.00    Types: Cigarettes  . Smokeless tobacco: Never Used  Substance Use Topics  . Alcohol use: Yes    Comment: occ  . Drug use: No     Review of Systems  Constitutional: Negative for fever. Negative for chills. Eyes: Negative for visual changes. ENT: Negative for sore throat. Cardiovascular: Negative for chest pain. Respiratory: Negative for shortness of breath. Gastrointestinal: Negative for nausea. Negative for vomiting. + indigestion Genitourinary: Negative for dysuria. Musculoskeletal: Negative for leg swelling. Skin: Negative for  rash. + diaphoresis Neurological: Negative for headaches. + near syncope   Physical Exam  Vital Signs: ED Triage Vitals  Enc Vitals Group     BP 04/02/20 1144 131/84     Pulse Rate 04/02/20 1144 (!) 123     Resp 04/02/20 1144 18     Temp 04/02/20 1144 98.3 F (36.8 C)     Temp Source 04/02/20 1144 Oral     SpO2 04/02/20 1144 97 %     Weight 04/02/20 1140 163 lb (73.9 kg)     Height 04/02/20 1140 5\' 6"  (1.676  m)     Head Circumference --      Peak Flow --      Pain Score 04/02/20 1140 0     Pain Loc --      Pain Edu? --      Excl. in GC? --     Constitutional: Alert and oriented. Well appearing. NAD.  Head: Normocephalic. Atraumatic. Eyes: Conjunctivae clear. Sclera anicteric. Pupils equal and symmetric. Nose: No masses or lesions. No congestion or rhinorrhea. Mouth/Throat: Wearing mask.  Neck: No stridor. Trachea midline.  Cardiovascular: Tachycardic regular rhythm. Extremities well perfused. Respiratory: Normal respiratory effort.  Lungs CTAB. Gastrointestinal: Soft. Non-distended. Non-tender.  Genitourinary: Deferred. Musculoskeletal: No lower extremity edema. No deformities. Neurologic:  Normal speech and language. No gross focal or lateralizing neurologic deficits are appreciated.  Skin: Skin is warm, dry and intact. No rash noted. Psychiatric: Odd affect.  EKG  Personally reviewed and interpreted by myself.   Date: 04/02/20 Time: 1141 Rate: 125 Rhythm: sinus Axis: normal Intervals: WNL Sinus tachycardia No STEMI    Radiology  Personally reviewed available imaging myself.   CXR - IMPRESSION:  Resolving infiltrates.   CT PE - IMPRESSION:  1. No CT evidence of pulmonary embolism or acute cardiopulmonary  disease.  2. A 1.2 cm x 1.1 cm calcification is seen within the lower outer  quadrant of the left breast. Correlation with mammography is  recommended.   Procedures  Procedure(s) performed (including critical care):  Procedures   Initial Impression / Assessment and Plan / MDM / ED Course  59 y.o. female who presents to the ED for an episode of indigestion type chest discomfort, lightheadedness, dizziness, near syncope.  On exam she is tachycardic, and has somewhat of an odd affect, but otherwise the remainder of her exam is unremarkable.  Ddx: arrhythmia, electrolyte abnormality, PE, anemia, GERD, substance use, atypical ACS  Labs initiated in triage  largely unremarkable, including troponin and delta troponin = negative.  EKG demonstrates sinus tachycardia, but no acute ischemic changes or evidence of acute arrhythmia.  Will plan for cardiac monitoring, CT PE, urine studies.  UA negative for infection, UDS positive for cannabis and cocaine.  Suspect this is the likely etiology of her symptoms.  HR has improved with a period of observation in the ER.  CT scan negative for PE.  Did see an incidental area of calcification in the left breast, which requires follow-up with mammography.  Updated patient on these results, including the need for mammography.  Given referrals for PCP for outpatient follow-up.  Feel she is otherwise stable for discharge.  Rx for reflux provided. Patient voices understanding and is in agreement.  Given return precautions.   _______________________________   As part of my medical decision making I have reviewed available labs, radiology tests, reviewed old records/performed chart review.   Final Clinical Impression(s) / ED Diagnosis  Lightheadedness Near syncope Tachycardia Substance use  Note:  This document was prepared using Dragon voice recognition software and may include unintentional dictation errors.   Lilia Pro., MD 04/03/20 [phone]

## 2020-04-02 NOTE — Discharge Instructions (Addendum)
Thank you for letting us take care of you in the emergency department today.   Please continue to take any regular, prescribed medications.  As best you can, avoid using marijuana and cocaine, as this can worsen your symptoms and impact your overall health.  Please follow up with: A primary care doctor to review your ER visit and follow up on your symptoms.  Information for 2 different clinics as listed below.  Please call to establish care.  Please also discuss with them your need for a mammogram, as we discussed based on the incidental findings on your CT scan of a calcification in the left breast area.  We will send you home with a prescription for reflux, please take as directed.  Please return to the ER for any new or worsening symptoms.

## 2020-04-02 NOTE — ED Triage Notes (Signed)
Pt comes POV with a "spell" of heartburn, dizziness, near syncope, and hot flash. Pt also reports middle back pain. Pt ambulatory to triage. States the pain has since left but she is still hot and "funny feeling".

## 2020-04-27 ENCOUNTER — Other Ambulatory Visit: Payer: Self-pay | Admitting: Internal Medicine

## 2020-04-27 DIAGNOSIS — Z803 Family history of malignant neoplasm of breast: Secondary | ICD-10-CM

## 2020-04-27 DIAGNOSIS — R9389 Abnormal findings on diagnostic imaging of other specified body structures: Secondary | ICD-10-CM

## 2020-04-27 DIAGNOSIS — Z1231 Encounter for screening mammogram for malignant neoplasm of breast: Secondary | ICD-10-CM

## 2020-04-27 DIAGNOSIS — R921 Mammographic calcification found on diagnostic imaging of breast: Secondary | ICD-10-CM

## 2020-05-11 ENCOUNTER — Ambulatory Visit
Admission: RE | Admit: 2020-05-11 | Discharge: 2020-05-11 | Disposition: A | Discharge status: Home / Self Care | Payer: BC Managed Care – PPO | Source: Ambulatory Visit | Attending: Internal Medicine | Admitting: Internal Medicine

## 2020-05-11 DIAGNOSIS — R921 Mammographic calcification found on diagnostic imaging of breast: Secondary | ICD-10-CM | POA: Insufficient documentation

## 2020-05-11 DIAGNOSIS — Z803 Family history of malignant neoplasm of breast: Secondary | ICD-10-CM | POA: Insufficient documentation

## 2020-05-12 ENCOUNTER — Ambulatory Visit: Payer: BC Managed Care – PPO | Admitting: Family Medicine

## 2020-05-15 ENCOUNTER — Other Ambulatory Visit: Payer: Self-pay

## 2020-05-15 ENCOUNTER — Encounter: Payer: Self-pay | Admitting: Emergency Medicine

## 2020-05-15 ENCOUNTER — Emergency Department: Payer: BC Managed Care – PPO

## 2020-05-15 ENCOUNTER — Emergency Department
Admission: EM | Admit: 2020-05-15 | Discharge: 2020-05-15 | Disposition: A | Discharge status: Home / Self Care | Payer: BC Managed Care – PPO | Attending: Emergency Medicine | Admitting: Emergency Medicine

## 2020-05-15 DIAGNOSIS — N200 Calculus of kidney: Secondary | ICD-10-CM | POA: Diagnosis not present

## 2020-05-15 DIAGNOSIS — R109 Unspecified abdominal pain: Secondary | ICD-10-CM | POA: Diagnosis present

## 2020-05-15 DIAGNOSIS — F17210 Nicotine dependence, cigarettes, uncomplicated: Secondary | ICD-10-CM | POA: Insufficient documentation

## 2020-05-15 LAB — URINALYSIS, COMPLETE (UACMP) WITH MICROSCOPIC
Bacteria, UA: NONE SEEN
Bilirubin Urine: NEGATIVE
Glucose, UA: NEGATIVE mg/dL
Hgb urine dipstick: NEGATIVE
Ketones, ur: 20 mg/dL — AB
Leukocytes,Ua: NEGATIVE
Nitrite: NEGATIVE
Protein, ur: NEGATIVE mg/dL
Specific Gravity, Urine: 1.021 (ref 1.005–1.030)
pH: 5 (ref 5.0–8.0)

## 2020-05-15 LAB — BASIC METABOLIC PANEL
Anion gap: 8 (ref 5–15)
BUN: 11 mg/dL (ref 6–20)
CO2: 28 mmol/L (ref 22–32)
Calcium: 10.2 mg/dL (ref 8.9–10.3)
Chloride: 105 mmol/L (ref 98–111)
Creatinine, Ser: 0.82 mg/dL (ref 0.44–1.00)
GFR calc Af Amer: >60 mL/min (ref >60)
GFR calc non Af Amer: >60 mL/min (ref >60)
Glucose, Bld: 105 mg/dL — ABNORMAL HIGH (ref 70–99)
Potassium: 3.1 mmol/L — ABNORMAL LOW (ref 3.5–5.1)
Sodium: 141 mmol/L (ref 135–145)

## 2020-05-15 LAB — CBC
HCT: 44 % (ref 36.0–46.0)
Hemoglobin: 15 g/dL (ref 12.0–15.0)
MCH: 30.7 pg (ref 26.0–34.0)
MCHC: 34.1 g/dL (ref 30.0–36.0)
MCV: 90 fL (ref 80.0–100.0)
Platelets: 337 10*3/uL (ref 150–400)
RBC: 4.89 MIL/uL (ref 3.87–5.11)
RDW: 13.8 % (ref 11.5–15.5)
WBC: 4.2 10*3/uL (ref 4.0–10.5)
nRBC: 0 % (ref 0.0–0.2)

## 2020-05-15 LAB — HEPATIC FUNCTION PANEL
ALT: 21 U/L (ref 0–44)
AST: 22 U/L (ref 15–41)
Albumin: 4.3 g/dL (ref 3.5–5.0)
Alkaline Phosphatase: 71 U/L (ref 38–126)
Bilirubin, Direct: 0.1 mg/dL (ref 0.0–0.2)
Indirect Bilirubin: 0.6 mg/dL (ref 0.3–0.9)
Total Bilirubin: 0.7 mg/dL (ref 0.3–1.2)
Total Protein: 8.2 g/dL — ABNORMAL HIGH (ref 6.5–8.1)

## 2020-05-15 LAB — LIPASE, BLOOD: Lipase: 24 U/L (ref 11–51)

## 2020-05-15 MED ORDER — POTASSIUM CHLORIDE CRYS ER 20 MEQ PO TBCR
40.0000 meq | EXTENDED_RELEASE_TABLET | Freq: Once | ORAL | Status: AC
  Administered 2020-05-15: 40 meq via ORAL
  Filled 2020-05-15: qty 2

## 2020-05-15 MED ORDER — SODIUM CHLORIDE 0.9 % IV BOLUS
1000.0000 mL | Freq: Once | INTRAVENOUS | Status: AC
  Administered 2020-05-15: 1000 mL via INTRAVENOUS

## 2020-05-15 MED ORDER — HYDROMORPHONE HCL 1 MG/ML IJ SOLN
0.5000 mg | Freq: Once | INTRAMUSCULAR | Status: AC
  Administered 2020-05-15: 0.5 mg via INTRAVENOUS
  Filled 2020-05-15: qty 1

## 2020-05-15 MED ORDER — ONDANSETRON HCL 4 MG/2ML IJ SOLN
4.0000 mg | Freq: Once | INTRAMUSCULAR | Status: AC
  Administered 2020-05-15: 4 mg via INTRAVENOUS
  Filled 2020-05-15: qty 2

## 2020-05-15 MED ORDER — IOHEXOL 300 MG/ML  SOLN
100.0000 mL | Freq: Once | INTRAMUSCULAR | Status: AC | PRN
  Administered 2020-05-15: 100 mL via INTRAVENOUS
  Filled 2020-05-15: qty 100

## 2020-05-15 NOTE — ED Triage Notes (Signed)
C/O left flank pain since Saturday.  STates has history of kidney stones.

## 2020-05-15 NOTE — Discharge Instructions (Addendum)
There were no signs of kidney stones that were moving out of kidney to cause you pain. More likely related to the muscle given the pain was worse with moving. Take tylenol 1g every 8 hours and ibuprofen 600 every 6 hours. Return to ED with shortness of breath, chest pain, vomiting, fevers, or any other concerns.    IMPRESSION:  BILATERAL nonobstructing renal calculi.     Sigmoid diverticulosis without evidence of diverticulitis.     Tiny umbilical hernia containing fat.     No acute intra-abdominal or intrapelvic abnormalities.     Aortic Atherosclerosis (ICD10-I70.0).

## 2020-05-15 NOTE — ED Notes (Signed)
This RN wasted 0.5mg  dilaudid with Nat Christen RN at 1340, unable to waste in pyxis at this time

## 2020-05-15 NOTE — ED Provider Notes (Signed)
[facility] Emergency Department Provider Note  ____________________________________________   First MD Initiated Contact with Patient 05/15/20 1257     (approximate)  I have reviewed the triage vital signs and the nursing notes.   HISTORY  Chief Complaint Flank Pain    HPI Belinda Oneill is a 59 y.o. female with kidney stones who comes in with flank pain.  Patient reports flank pain on the left side for the past 4 days.  The pain has been constant, severe, nothing makes it better, nothing makes it worse.  States that she is had kidney stones before and not sure if that is causing this.  She reports having a lithotripsy in the past.  Denies any fevers.  Denies any chest pain, shortness of breath.  Patient denies any falls or muscle strain.          Past Medical History:  Diagnosis Date  . Kidney stones   . Kidney stones     There are no problems to display for this patient.   Past Surgical History:  Procedure Laterality Date  . LITHOTRIPSY      Prior to Admission medications   Medication Sig Start Date End Date Taking? Authorizing Provider  famotidine (PEPCID) 20 MG tablet Take 1 tablet (20 mg total) by mouth 2 (two) times daily. 04/02/20 06/01/20  Miguel Aschoff., MD  furosemide (LASIX) 20 MG tablet Take 1 tablet (20 mg total) by mouth daily. 05/30/17 05/30/18  Emily Filbert, MD  ondansetron (ZOFRAN ODT) 4 MG disintegrating tablet Take 1 tablet (4 mg total) by mouth every 8 (eight) hours as needed for nausea or vomiting. 02/13/20   Minna Antis, MD  oxyCODONE-acetaminophen (PERCOCET) 5-325 MG tablet Take 1-2 tablets by mouth every 8 (eight) hours as needed. 05/30/17   Emily Filbert, MD  oxyCODONE-acetaminophen (PERCOCET) 5-325 MG tablet Take 1-2 tablets by mouth every 8 (eight) hours as needed for severe pain. 01/15/18   Emily Filbert, MD  oxyCODONE-acetaminophen (ROXICET) 5-325 MG tablet Take 1-2 tablets by mouth every 6 (six)  hours as needed. Patient not taking: Reported on 05/30/2017 09/16/15   Myrna Blazer, MD  pantoprazole (PROTONIX) 40 MG tablet Take 1 tablet (40 mg total) by mouth daily. 08/09/19 08/08/20  Minna Antis, MD  polyethylene glycol (MIRALAX / Ethelene Hal) packet Take 17 g by mouth daily. 07/20/18   Emily Filbert, MD  predniSONE (DELTASONE) 10 MG tablet Take 1 tablet (10 mg total) by mouth daily. Day 1-3: take 4 tablets PO daily Day 4-6: take 3 tablets PO daily Day 7-9: take 2 tablets PO daily Day 10-12: take 1 tablet PO daily 02/13/20   Minna Antis, MD    Allergies Patient has no known allergies.  Family History  Problem Relation Age of Onset  . Breast cancer Maternal Aunt     Social History Social History   Tobacco Use  . Smoking status: Current Every Day Smoker    Packs/day: 0.25    Years: 40.00    Pack years: 10.00    Types: Cigarettes  . Smokeless tobacco: Never Used  Substance Use Topics  . Alcohol use: Yes    Comment: occ  . Drug use: No      Review of Systems Constitutional: No fever/chills Eyes: No visual changes. ENT: No sore throat. Cardiovascular: Denies chest pain. Respiratory: Denies shortness of breath. Gastrointestinal: No abdominal pain.  No nausea, no vomiting.  No diarrhea.  No constipation. Genitourinary: Negative for dysuria. Musculoskeletal:  Negative for back pain.  Left flank pain Skin: Negative for rash. Neurological: Negative for headaches, focal weakness or numbness. All other ROS negative ____________________________________________   PHYSICAL EXAM:  VITAL SIGNS: ED Triage Vitals  Enc Vitals Group     BP 05/15/20 0946 (!) 111/96     Pulse Rate 05/15/20 0946 (!) 104     Resp 05/15/20 0946 16     Temp 05/15/20 0946 98.8 F (37.1 C)     Temp Source 05/15/20 0946 Oral     SpO2 05/15/20 0946 95 %     Weight 05/15/20 0944 162 lb 14.7 oz (73.9 kg)     Height 05/15/20 0944 5\' 6"  (1.676 m)     Head Circumference --       Peak Flow --      Pain Score 05/15/20 0944 9     Pain Loc --      Pain Edu? --      Excl. in GC? --     Constitutional: Alert and oriented. Well appearing and in no acute distress. Eyes: Conjunctivae are normal. EOMI. Head: Atraumatic. Nose: No congestion/rhinnorhea. Mouth/Throat: Mucous membranes are moist.   Neck: No stridor. Trachea Midline. FROM Cardiovascular: Normal rate, regular rhythm. Grossly normal heart sounds.  Good peripheral circulation. Respiratory: Normal respiratory effort.  No retractions. Lungs CTAB. Gastrointestinal: Soft and nontender. No distention. No abdominal bruits.  Musculoskeletal: No lower extremity tenderness nor edema.  No joint effusions. Neurologic:  Normal speech and language. No gross focal neurologic deficits are appreciated.  Skin:  Skin is warm, dry and intact. No rash noted. Psychiatric: Mood and affect are normal. Speech and behavior are normal. GU: Deferred  Back: Tenderness when I push on the lower left flank, worse with movement ____________________________________________   LABS (all labs ordered are listed, but only abnormal results are displayed)  Labs Reviewed  URINALYSIS, COMPLETE (UACMP) WITH MICROSCOPIC - Abnormal; Notable for the following components:      Result Value   Color, Urine YELLOW (*)    APPearance CLOUDY (*)    Ketones, ur 20 (*)    All other components within normal limits  BASIC METABOLIC PANEL - Abnormal; Notable for the following components:   Potassium 3.1 (*)    Glucose, Bld 105 (*)    All other components within normal limits  CBC  HEPATIC FUNCTION PANEL  LIPASE, BLOOD   ____________________________________________      Official radiology report(s): CT ABDOMEN PELVIS W CONTRAST  Result Date: 05/15/2020 CLINICAL DATA:  LEFT flank pain since Saturday, distension, history kidney stones, smoker EXAM: CT ABDOMEN AND PELVIS WITH CONTRAST TECHNIQUE: Multidetector CT imaging of the abdomen and pelvis  was performed using the standard protocol following bolus administration of intravenous contrast. Sagittal and coronal MPR images reconstructed from axial data set. CONTRAST:  Monday OMNIPAQUE IOHEXOL 300 MG/ML SOLN IV. No oral contrast. COMPARISON:  01/12/2018 FINDINGS: Lower chest: Minimal atelectasis or scarring at LEFT base. Hepatobiliary: Focal fatty infiltration of liver adjacent to falciform fissure. Gallbladder and liver otherwise normal appearance Pancreas: Normal appearance Spleen: Normal appearance Adrenals/Urinary Tract: BILATERAL nonobstructing renal calculi. No hydronephrosis, hydroureter, or ureteral calcification. Adrenal glands, kidneys, and bladder unremarkable. Stomach/Bowel: Sigmoid diverticulosis without evidence of diverticulitis. Normal appendix. Stomach and bowel loops otherwise unremarkable. Vascular/Lymphatic: Scattered pelvic phleboliths. Atherosclerotic calcifications aorta and iliac arteries without aneurysm. No adenopathy. Reproductive: Unremarkable uterus and adnexa Other: Small focus of probable fat necrosis with calcification in the LEFT lower quadrant, unchanged since 2019. No free air  or free fluid. Tiny umbilical hernia containing fat. Musculoskeletal: Mild facet degenerative changes lower lumbar spine. IMPRESSION: BILATERAL nonobstructing renal calculi. Sigmoid diverticulosis without evidence of diverticulitis. Tiny umbilical hernia containing fat. No acute intra-abdominal or intrapelvic abnormalities. Aortic Atherosclerosis (ICD10-I70.0). Electronically Signed   By: Ulyses Southward M.D.   On: 05/15/2020 15:07    ____________________________________________   PROCEDURES  Procedure(s) performed (including Critical Care):  Procedures   ____________________________________________   INITIAL IMPRESSION / ASSESSMENT AND PLAN / ED COURSE  Belinda Oneill was evaluated in Emergency Department on 05/15/2020 for the symptoms described in the history of present illness. She was  evaluated in the context of the global COVID-19 pandemic, which necessitated consideration that the patient might be at risk for infection with the SARS-CoV-2 virus that causes COVID-19. Institutional protocols and algorithms that pertain to the evaluation of patients at risk for COVID-19 are in a state of rapid change based on information released by regulatory bodies including the CDC and federal and state organizations. These policies and algorithms were followed during the patient's care in the ED.    Patient is a well-appearing 59 year old who comes in with left flank pain concern for kidney stone.  Labs were ordered in triage to evaluate for Electra abnormalities, AKI, UTI.  Her urine x-ray does not have any blood in it so think would be best to proceed with CT with contrast just to make sure no evidence of diverticulitis, AAA, kidney stone, obstruction.  Patient was slightly tachycardic when she first got here but she has no AKI.  We will give a little bit of fluids and some IV Dilaudid and IV Zofran to help with pain and nausea.  If CT is negative this could be musculoskeletal in nature given the pain is worse with movement and improved reproducible on exam.  She denies any falls she denies any chest pain the pain is in the lower flank region.  Low suspicion for ACS.  Denies any shortness of breath again notes lower flank pain to suggest PE  Labs are reassuring with UTI.  K slightly low at 3.1 with some oral repletion notes of pancreatitis, no anemia.  No white count elevation.   IMPRESSION:  BILATERAL nonobstructing renal calculi.    Sigmoid diverticulosis without evidence of diverticulitis.    Tiny umbilical hernia containing fat.    No acute intra-abdominal or intrapelvic abnormalities.    Aortic Atherosclerosis (ICD10-I70.0).    Discussed with patient her CT results.  CT is reassuring.  Suspect this is more likely musculoskeletal in nature given it is worse with movement and worse  with palpation.  Patient's pain is now relieved with medications.  Patient is tolerating p.o.  Discussed symptomatic treatment with Tylenol and ibuprofen and we discussed return precautions.  Patient feels comfortable being discharged home at this time  I discussed the provisional nature of ED diagnosis, the treatment so far, the ongoing plan of care, follow up appointments and return precautions with the patient and any family or support people present. They expressed understanding and agreed with the plan, discharged home.   ____________________________________________   FINAL CLINICAL IMPRESSION(S) / ED DIAGNOSES   Final diagnoses:  Flank pain      MEDICATIONS GIVEN DURING THIS VISIT:  Medications  potassium chloride SA (KLOR-CON) CR tablet 40 mEq (has no administration in time range)  sodium chloride 0.9 % bolus 1,000 mL (1,000 mLs Intravenous New Bag/Given 05/15/20 1345)  ondansetron (ZOFRAN) injection 4 mg (4 mg Intravenous Given 05/15/20 1339)  HYDROmorphone (DILAUDID) injection 0.5 mg (0.5 mg Intravenous Given 05/15/20 1340)  iohexol (OMNIPAQUE) 300 MG/ML solution 100 mL (100 mLs Intravenous Contrast Given 05/15/20 1425)     ED Discharge Orders    None       Note:  This document was prepared using Dragon voice recognition software and may include unintentional dictation errors.   Concha SeFunke, Kassy Mcenroe E, MD 05/15/20 1531

## 2020-05-16 ENCOUNTER — Other Ambulatory Visit: Payer: Self-pay | Admitting: Internal Medicine

## 2020-05-16 DIAGNOSIS — N6320 Unspecified lump in the left breast, unspecified quadrant: Secondary | ICD-10-CM

## 2021-07-15 IMAGING — DX DG CHEST 1V PORT
1 series · 1 of 1 positions shown · non-contrast
Comparison: 08/09/2019

CLINICAL DATA: 43EF0-SN.  Nausea and vomiting.  Smoker.

EXAM:
PORTABLE CHEST 1 VIEW

[chest ap]
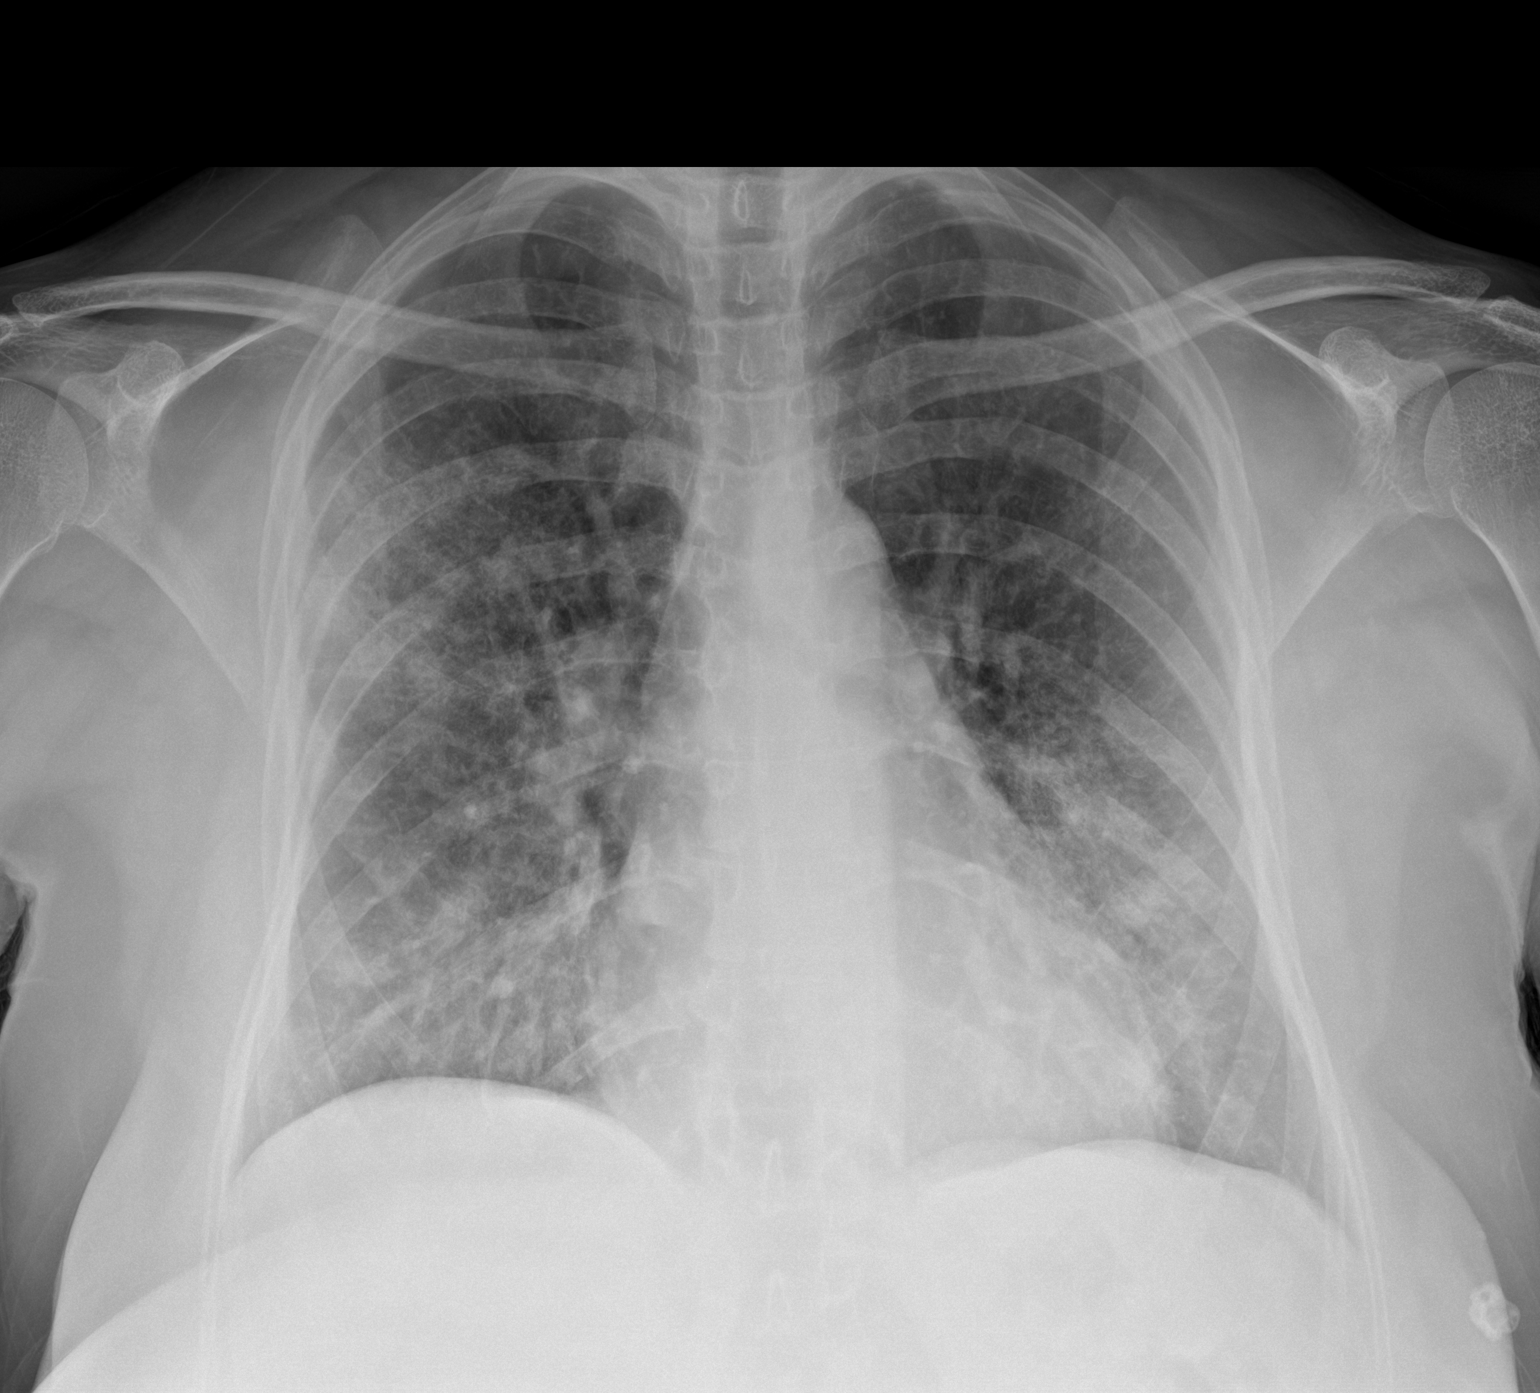

[1 of 1 positions shown; findings below may reference images not displayed]

FINDINGS: Midline trachea. Normal heart size. No pleural effusion or
pneumothorax. Diffuse interstitial thickening. More focal
interstitial opacities within the periphery of the right upper lobe
and left lower lung.
IMPRESSION: Bilateral interstitial opacities, suspicious for 43EF0-SN pneumonia.

Underlying interstitial prominence, likely due to smoking/chronic
bronchitis.

## 2021-10-15 IMAGING — CT CT ABD-PELV W/ CM
2 of 5 series · 17 of 46 positions shown, 19 images · IV contrast (APPLIED)
Comparison: 01/12/2018

CLINICAL DATA: LEFT flank pain since [REDACTED], distension, history
kidney stones, smoker

EXAM:
CT ABDOMEN AND PELVIS WITH CONTRAST
TECHNIQUE: Multidetector CT imaging of the abdomen and pelvis was performed
using the standard protocol following bolus administration of
intravenous contrast. Sagittal and coronal MPR images reconstructed
from axial data set.
CONTRAST:  100mL OMNIPAQUE IOHEXOL 300 MG/ML SOLN IV. No oral
contrast.

[Series 2: routine abd/pel with · axial · 0.82mm/px · z∈[-634,-250]mm · 14 of 87 slices shown, 16 images]
[im 5/87  soft-tissue]
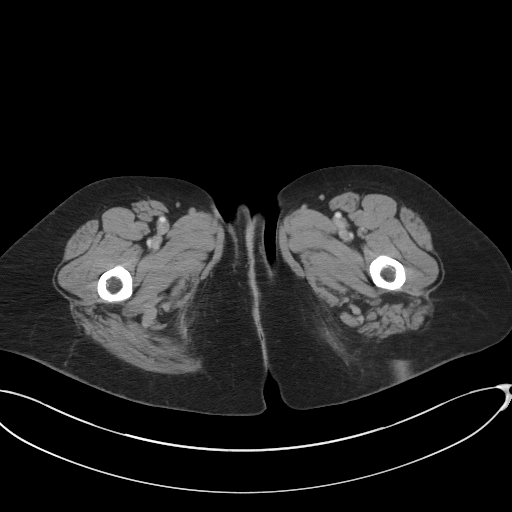
[im 5/87  bone]
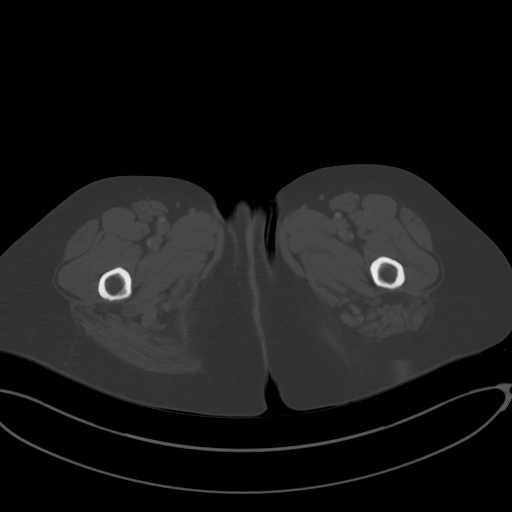
[im 10/87  soft-tissue]
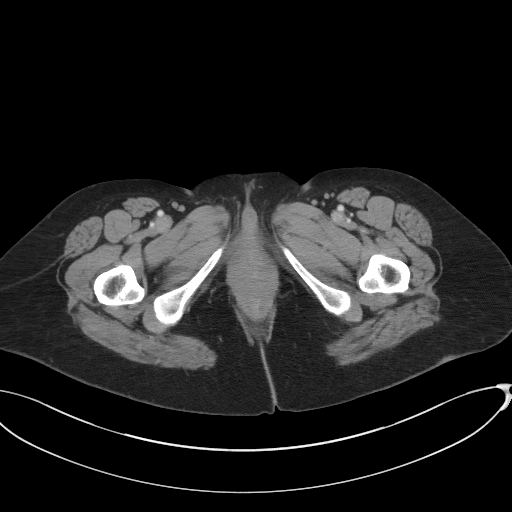
[im 19/87  soft-tissue]
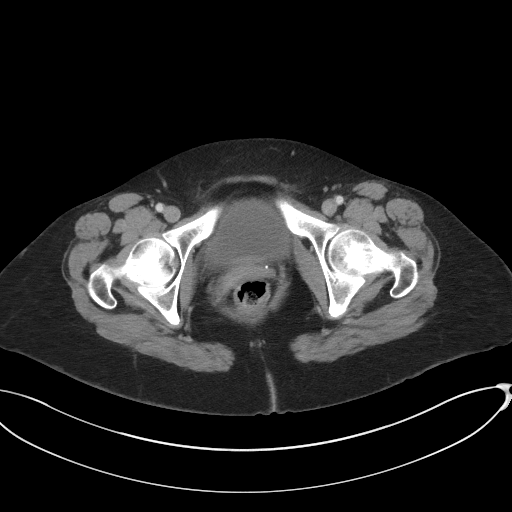
[im 23/87  soft-tissue]
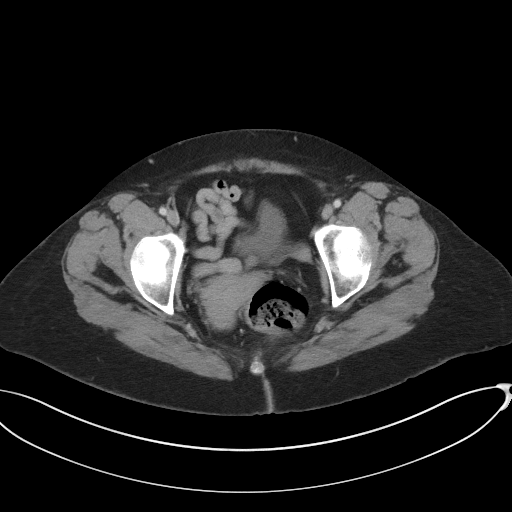
[im 28/87  soft-tissue]
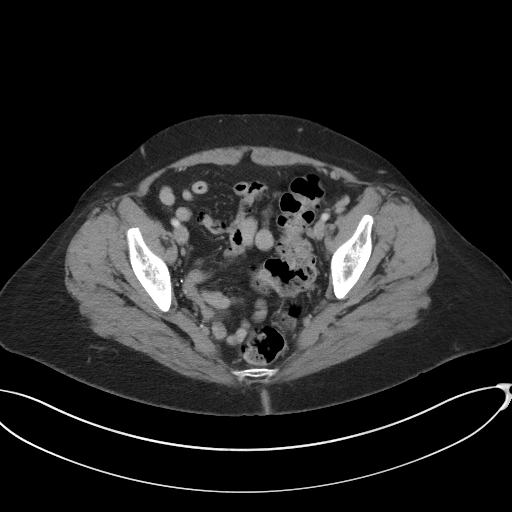
[im 37/87  soft-tissue]
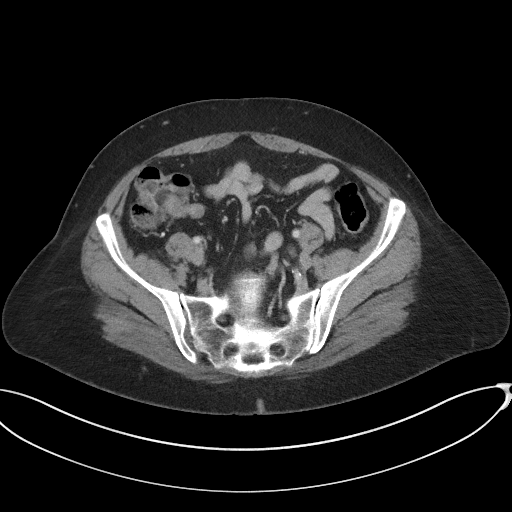
[im 41/87  soft-tissue]
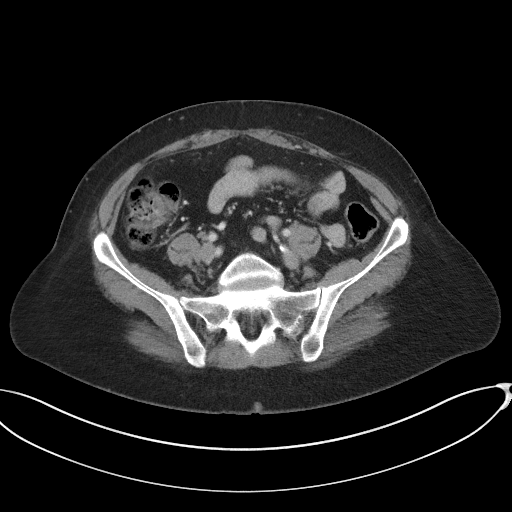
[im 46/87  soft-tissue]
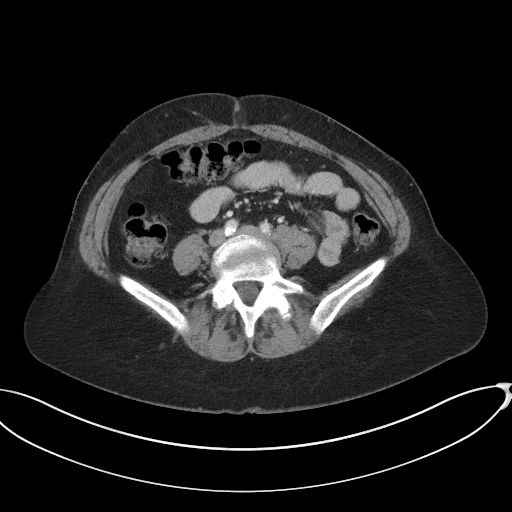
[im 50/87  soft-tissue]
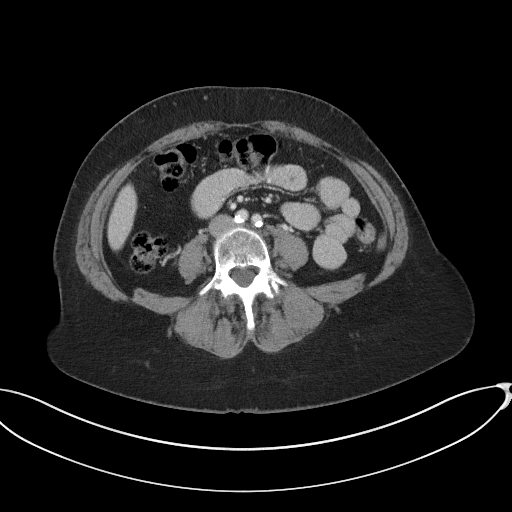
[im 50/87  bone]
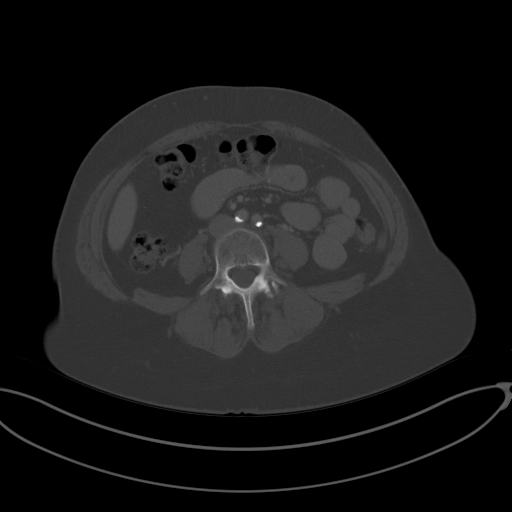
[im 59/87  soft-tissue]
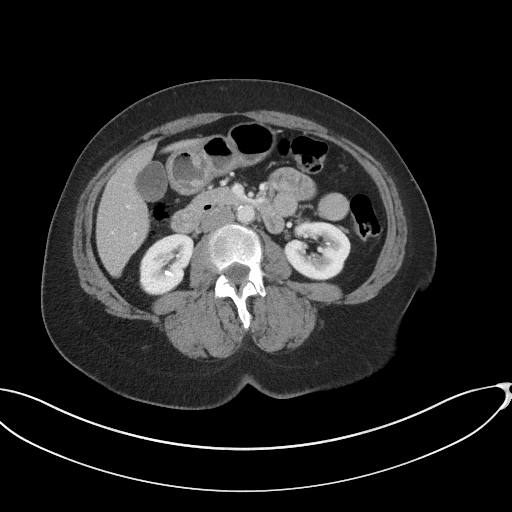
[im 64/87  soft-tissue]
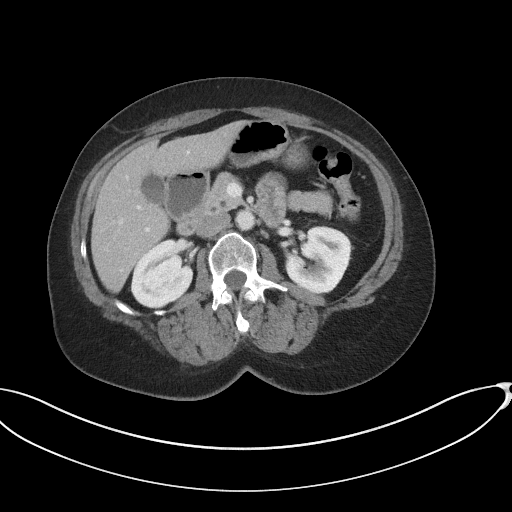
[im 68/87  soft-tissue]
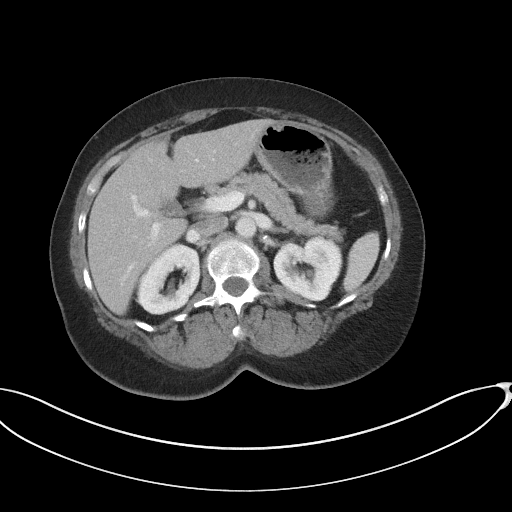
[im 77/87  soft-tissue]
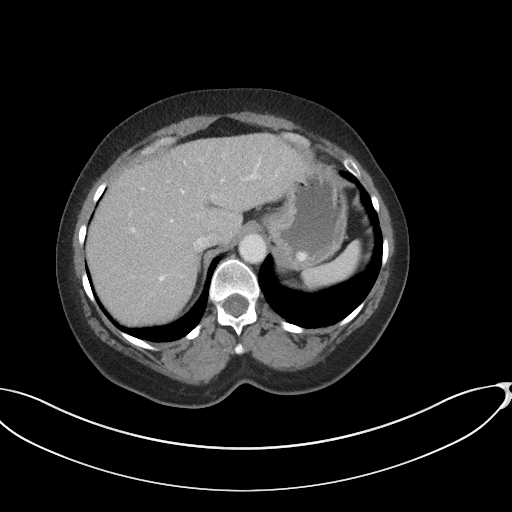
[im 82/87  soft-tissue]
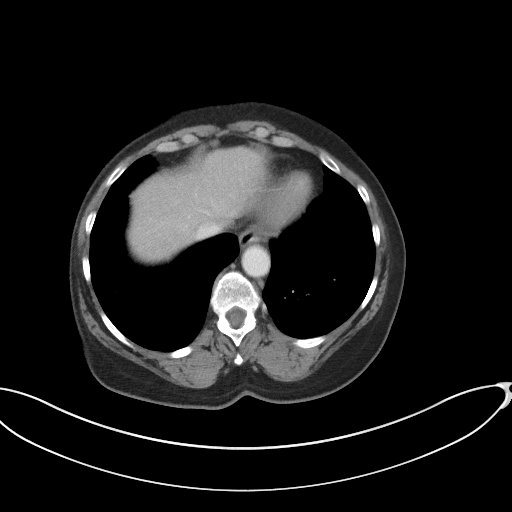

[Series 5: coronal st · coronal · 0.87mm/px · 3 of 94 slices shown]
[im 32/94  soft-tissue]
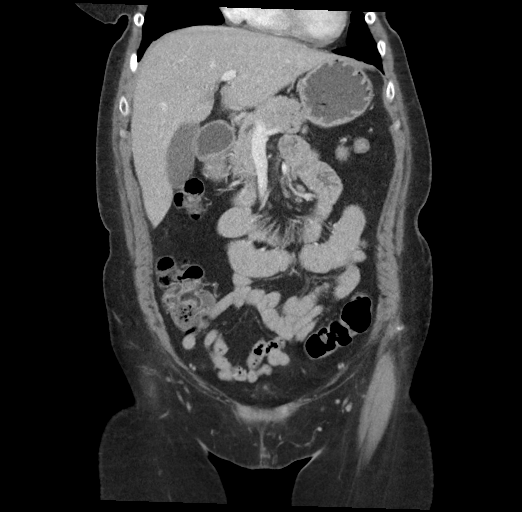
[im 42/94  soft-tissue]
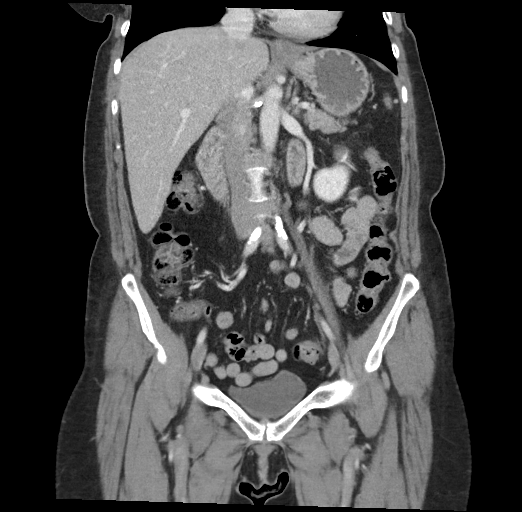
[im 52/94  soft-tissue]
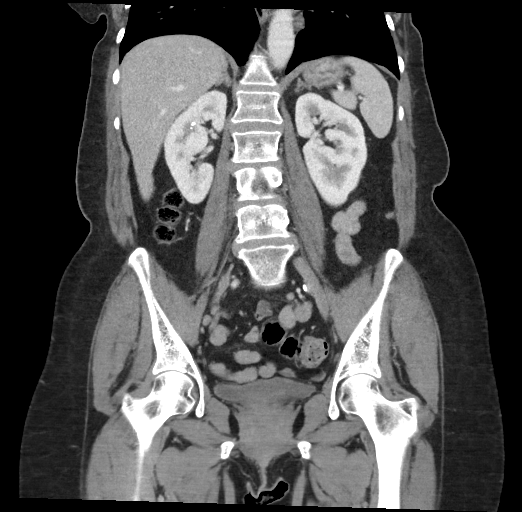

[17 of 46 positions shown; findings below may reference images not displayed]

FINDINGS: Lower chest: Minimal atelectasis or scarring at LEFT base.

Hepatobiliary: Focal fatty infiltration of liver adjacent to
falciform fissure. Gallbladder and liver otherwise normal appearance

Pancreas: Normal appearance

Spleen: Normal appearance

Adrenals/Urinary Tract: BILATERAL nonobstructing renal calculi. No
hydronephrosis, hydroureter, or ureteral calcification. Adrenal
glands, kidneys, and bladder unremarkable.

Stomach/Bowel: Sigmoid diverticulosis without evidence of
diverticulitis. Normal appendix. Stomach and bowel loops otherwise
unremarkable.

Vascular/Lymphatic: Scattered pelvic phleboliths. Atherosclerotic
calcifications aorta and iliac arteries without aneurysm. No
adenopathy.

Reproductive: Unremarkable uterus and adnexa

Other: Small focus of probable fat necrosis with calcification in
the LEFT lower quadrant, unchanged since 1391. No free air or free
fluid. Tiny umbilical hernia containing fat.

Musculoskeletal: Mild facet degenerative changes lower lumbar spine.
IMPRESSION: BILATERAL nonobstructing renal calculi.

Sigmoid diverticulosis without evidence of diverticulitis.

Tiny umbilical hernia containing fat.

No acute intra-abdominal or intrapelvic abnormalities.

Aortic Atherosclerosis ([id number].R).

## 2022-05-21 ENCOUNTER — Other Ambulatory Visit: Payer: Self-pay

## 2022-05-21 ENCOUNTER — Encounter: Payer: Self-pay | Admitting: Emergency Medicine

## 2022-05-21 ENCOUNTER — Emergency Department
Admission: EM | Admit: 2022-05-21 | Discharge: 2022-05-21 | Disposition: A | Discharge status: Home / Self Care | Payer: BC Managed Care – PPO | Attending: Emergency Medicine | Admitting: Emergency Medicine

## 2022-05-21 ENCOUNTER — Emergency Department: Payer: BC Managed Care – PPO

## 2022-05-21 DIAGNOSIS — J069 Acute upper respiratory infection, unspecified: Secondary | ICD-10-CM

## 2022-05-21 MED ORDER — ACETAMINOPHEN 500 MG PO TABS
1000.0000 mg | ORAL_TABLET | Freq: Once | ORAL | Status: AC
  Administered 2022-05-21: 1000 mg via ORAL
  Filled 2022-05-21: qty 2

## 2022-05-21 NOTE — ED Provider Notes (Signed)
[facility] Provider Note    Event Date/Time   First MD Initiated Contact with Patient 05/21/22 1703     (approximate)   History   URI   HPI  Belinda Oneill is a 61 y.o. female past medical history of kidney stones presents with sore throat cough.  Symptoms have been going on for several days.  Cough productive of green sputum she feels mildly short of breath and has pain in the center of her chest that feels like pinpricks when she coughs.  She has had decreased appetite but still tolerating p.o. denies nausea vomiting abdominal pain diarrhea.  Had some chills no measured fevers at home.  Denies sick contacts.  She is otherwise healthy.  Took Robitussin once for cough.    Past Medical History:  Diagnosis Date   Kidney stones    Kidney stones     There are no problems to display for this patient.    Physical Exam  Triage Vital Signs: ED Triage Vitals [05/21/22 1602]  Enc Vitals Group     BP 100/89     Pulse Rate 94     Resp 18     Temp 98.7 F (37.1 C)     Temp Source Oral     SpO2 92 %     Weight 158 lb (71.7 kg)     Height 5\' 4"  (1.626 m)     Head Circumference      Peak Flow      Pain Score 9     Pain Loc      Pain Edu?      Excl. in GC?     Most recent vital signs: Vitals:   05/21/22 1602 05/21/22 1815  BP: 100/89   Pulse: 94   Resp: 18   Temp: 98.7 F (37.1 C)   SpO2: 92% 97%     General: Awake, no distress.  CV:  Good peripheral perfusion.  Resp:  Normal effort.  No increased work of breathing lungs are clear, frequent wet sounding cough Abd:  No distention.  Abdomen is soft and nontender Neuro:             Awake, Alert, Oriented x 3  Other:  No peripheral edema   ED Results / Procedures / Treatments  Labs (all labs ordered are listed, but only abnormal results are displayed) Labs Reviewed - No data to display   EKG  EKG interpretation performed by myself: NSR, nml axis, nml intervals, no acute ischemic  changes    RADIOLOGY I reviewed and interpreted the CXR which does not show any acute cardiopulmonary process    PROCEDURES:  Critical Care performed: No  Procedures   MEDICATIONS ORDERED IN ED: Medications  acetaminophen (TYLENOL) tablet 1,000 mg (1,000 mg Oral Given 05/21/22 1800)     IMPRESSION / MDM / ASSESSMENT AND PLAN / ED COURSE  I reviewed the triage vital signs and the nursing notes.                              Patient's presentation is most consistent with acute complicated illness / injury requiring diagnostic workup.  Differential diagnosis includes, but is not limited to, pneumonia, viral illness, bronchitis, pleurisy  The patient is a 61 year old female who is relatively healthy presenting with cough congestion sore throat times several days.  She does feel mildly short of breath has pain in the chest with coughing  no exertional pain.  She has sore throat nasal congestion generally feeling fatigued.  No GI symptoms tolerating p.o.  Patient's initial room air sat is 92% in triage however on my evaluation she is satting 97%.  Lungs are clear though she does have frequent cough.  Chest x-ray obtained to rule out pneumonia and it is clear.  Suspect viral bronchitis.  EKG has no ischemic changes.  Recommend supportive care with Tylenol Motrin and OTC cough suppressants.  She is appropriate for discharge at this time       FINAL CLINICAL IMPRESSION(S) / ED DIAGNOSES   Final diagnoses:  Viral upper respiratory tract infection     Rx / DC Orders   ED Discharge Orders     None        Note:  This document was prepared using Dragon voice recognition software and may include unintentional dictation errors.   Georga Hacking, MD 05/21/22 [phone]

## 2022-05-21 NOTE — Discharge Instructions (Signed)
Your chest X-ray did not show any evidence of pneumonia. You likely have a viral infection causing your symptoms.  You can take Tylenol and Motrin for aches and sore throat.  You can try over the counter Robitussin or honey for cough.  If you develop worsening shortness of breath or worsening chest pain please return to emergency department.

## 2022-05-21 NOTE — ED Triage Notes (Signed)
Patient to ED via POV for cold like symptoms- chills, cough, and pressure in chest when coughing. Symptoms since Sunday. Aox4.

## 2022-06-07 ENCOUNTER — Encounter: Payer: Self-pay | Admitting: Emergency Medicine

## 2022-06-07 ENCOUNTER — Other Ambulatory Visit: Payer: Self-pay

## 2022-06-07 ENCOUNTER — Emergency Department: Payer: Self-pay

## 2022-06-07 ENCOUNTER — Emergency Department
Admission: EM | Admit: 2022-06-07 | Discharge: 2022-06-07 | Disposition: A | Discharge status: Home / Self Care | Payer: Self-pay | Attending: Emergency Medicine | Admitting: Emergency Medicine

## 2022-06-07 DIAGNOSIS — N201 Calculus of ureter: Secondary | ICD-10-CM

## 2022-06-07 DIAGNOSIS — N132 Hydronephrosis with renal and ureteral calculous obstruction: Secondary | ICD-10-CM | POA: Insufficient documentation

## 2022-06-07 LAB — CBC WITH DIFFERENTIAL/PLATELET
Abs Immature Granulocytes: 0.02 10*3/uL (ref 0.00–0.07)
Basophils Absolute: 0.1 10*3/uL (ref 0.0–0.1)
Basophils Relative: 1 %
Eosinophils Absolute: 0 10*3/uL (ref 0.0–0.5)
Eosinophils Relative: 1 %
HCT: 40.7 % (ref 36.0–46.0)
Hemoglobin: 13.6 g/dL (ref 12.0–15.0)
Immature Granulocytes: 0 %
Lymphocytes Relative: 33 %
Lymphs Abs: 2 10*3/uL (ref 0.7–4.0)
MCH: 30.1 pg (ref 26.0–34.0)
MCHC: 33.4 g/dL (ref 30.0–36.0)
MCV: 90 fL (ref 80.0–100.0)
Monocytes Absolute: 0.5 10*3/uL (ref 0.1–1.0)
Monocytes Relative: 8 %
Neutro Abs: 3.6 10*3/uL (ref 1.7–7.7)
Neutrophils Relative %: 57 %
Platelets: 329 10*3/uL (ref 150–400)
RBC: 4.52 MIL/uL (ref 3.87–5.11)
RDW: 13.4 % (ref 11.5–15.5)
WBC: 6.2 10*3/uL (ref 4.0–10.5)
nRBC: 0 % (ref 0.0–0.2)

## 2022-06-07 LAB — COMPREHENSIVE METABOLIC PANEL
ALT: 20 U/L (ref 0–44)
AST: 20 U/L (ref 15–41)
Albumin: 4.2 g/dL (ref 3.5–5.0)
Alkaline Phosphatase: 79 U/L (ref 38–126)
Anion gap: 6 (ref 5–15)
BUN: 11 mg/dL (ref 8–23)
CO2: 28 mmol/L (ref 22–32)
Calcium: 9.7 mg/dL (ref 8.9–10.3)
Chloride: 104 mmol/L (ref 98–111)
Creatinine, Ser: 0.7 mg/dL (ref 0.44–1.00)
GFR, Estimated: >60 mL/min (ref >60)
Glucose, Bld: 98 mg/dL (ref 70–99)
Potassium: 3.7 mmol/L (ref 3.5–5.1)
Sodium: 138 mmol/L (ref 135–145)
Total Bilirubin: 0.8 mg/dL (ref 0.3–1.2)
Total Protein: 7.9 g/dL (ref 6.5–8.1)

## 2022-06-07 LAB — URINALYSIS, ROUTINE W REFLEX MICROSCOPIC
Bacteria, UA: NONE SEEN
Bilirubin Urine: NEGATIVE
Glucose, UA: NEGATIVE mg/dL
Ketones, ur: NEGATIVE mg/dL
Nitrite: NEGATIVE
Protein, ur: NEGATIVE mg/dL
Specific Gravity, Urine: 1.014 (ref 1.005–1.030)
pH: 7 (ref 5.0–8.0)

## 2022-06-07 MED ORDER — ONDANSETRON 4 MG PO TBDP: 4.0000 mg | ORAL_TABLET | Freq: Three times a day (TID) | ORAL | 0 refills | Status: AC | PRN

## 2022-06-07 MED ORDER — SODIUM CHLORIDE 0.9 % IV BOLUS
1000.0000 mL | Freq: Once | INTRAVENOUS | Status: AC
  Administered 2022-06-07: 1000 mL via INTRAVENOUS

## 2022-06-07 MED ORDER — NAPROXEN 500 MG PO TABS: 500.0000 mg | ORAL_TABLET | Freq: Two times a day (BID) | ORAL | 11 refills | Status: AC

## 2022-06-07 MED ORDER — OXYCODONE-ACETAMINOPHEN 5-325 MG PO TABS
1.0000 | ORAL_TABLET | Freq: Once | ORAL | Status: AC
  Administered 2022-06-07: 1 via ORAL
  Filled 2022-06-07: qty 1

## 2022-06-07 MED ORDER — OXYCODONE-ACETAMINOPHEN 5-325 MG PO TABS: 1.0000 | ORAL_TABLET | ORAL | 0 refills | Status: AC | PRN

## 2022-06-07 MED ORDER — ONDANSETRON HCL 4 MG/2ML IJ SOLN
4.0000 mg | Freq: Once | INTRAMUSCULAR | Status: AC
  Administered 2022-06-07: 4 mg via INTRAVENOUS
  Filled 2022-06-07: qty 2

## 2022-06-07 MED ORDER — TAMSULOSIN HCL 0.4 MG PO CAPS: 0.4000 mg | ORAL_CAPSULE | Freq: Every day | ORAL | 1 refills | Status: DC

## 2022-06-07 MED ORDER — KETOROLAC TROMETHAMINE 15 MG/ML IJ SOLN
15.0000 mg | Freq: Once | INTRAMUSCULAR | Status: AC
  Administered 2022-06-07: 15 mg via INTRAVENOUS
  Filled 2022-06-07: qty 1

## 2022-06-07 NOTE — ED Triage Notes (Signed)
Pt presents via POV with complaints of right sided flank pain that started last night. Pt took OTC Tylenol PTA without improvement. Hx of kidney stones - endorses dysuria. Denies CP, N/V/D, or fevers.

## 2022-06-07 NOTE — ED Notes (Signed)
See triage note  Presents with sudden onset of right flank pain  States she has a hx of kidney stones  Pain feel the same  slight nausea

## 2022-06-07 NOTE — ED Provider Notes (Signed)
[facility] Provider Note    None    (approximate)   History   Chief Complaint Flank Pain   HPI Belinda Oneill is a 61 y.o. female, history of nephrolithiasis, presents emergency department for evaluation of right-sided flank pain started last night.  She states that it feels very similar to kidney stones that she has had in the past.  She has taken over-the-counter Tylenol without any improvement.  Endorses some nausea and difficulty urinating as well.  Denies fever, chest pain, shortness of breath, abdominal pain, dizziness/lightheadedness, rash/lesions, or headache.  History Limitations: No limitations.        Physical Exam  Triage Vital Signs: ED Triage Vitals  Enc Vitals Group     BP 06/07/22 0606 (!) 163/96     Pulse Rate 06/07/22 0606 74     Resp 06/07/22 0606 18     Temp 06/07/22 0606 97.9 F (36.6 C)     Temp Source 06/07/22 0606 Oral     SpO2 06/07/22 0606 100 %     Weight 06/07/22 0605 158 lb 11.7 oz (72 kg)     Height 06/07/22 0605 5\' 4"  (1.626 m)     Head Circumference --      Peak Flow --      Pain Score 06/07/22 0605 10     Pain Loc --      Pain Edu? --      Excl. in GC? --     Most recent vital signs: Vitals:   06/07/22 0606 06/07/22 0720  BP: (!) 163/96 (!) 160/90  Pulse: 74 70  Resp: 18 18  Temp: 97.9 F (36.6 C)   SpO2: 100% 100%    General: Awake, appears uncomfortable and in pain. Skin: Warm, dry. No rashes or lesions.  Eyes: PERRL. Conjunctivae normal.  CV: Good peripheral perfusion.  Resp: Normal effort.  Abd: Soft, non-tender. No distention.  Neuro: At baseline. No gross neurological deficits.   Focused Exam: Right-sided CVAT present  Physical Exam    ED Results / Procedures / Treatments  Labs (all labs ordered are listed, but only abnormal results are displayed) Labs Reviewed  URINALYSIS, ROUTINE W REFLEX MICROSCOPIC - Abnormal; Notable for the following components:      Result Value   Color,  Urine YELLOW (*)    APPearance HAZY (*)    Hgb urine dipstick SMALL (*)    Leukocytes,Ua MODERATE (*)    All other components within normal limits  CBC WITH DIFFERENTIAL/PLATELET  COMPREHENSIVE METABOLIC PANEL     EKG N/A.   RADIOLOGY  ED Provider Interpretation: I personally reviewed and interpreted this CT scan, 7 mm stone at the UVJ.  CT Renal Stone Study  Result Date: 06/07/2022 CLINICAL DATA:  Right-sided flank pain that started last night EXAM: CT ABDOMEN AND PELVIS WITHOUT CONTRAST TECHNIQUE: Multidetector CT imaging of the abdomen and pelvis was performed following the standard protocol without IV contrast. RADIATION DOSE REDUCTION: This exam was performed according to the departmental dose-optimization program which includes automated exposure control, adjustment of the mA and/or kV according to patient size and/or use of iterative reconstruction technique. COMPARISON:  05/15/2020 FINDINGS: Lower chest:  No contributory findings. Hepatobiliary: No focal liver abnormality.No evidence of biliary obstruction or stone. Pancreas: Unremarkable. Spleen: Unremarkable. Adrenals/Urinary Tract: Negative adrenals. Right hydroureteronephrosis above a 7 x 5 mm stone at the UVJ. Numerous bilateral renal calculi. Stones on the right measure up to 8 mm at the upper pole. Smaller  left renal calculi except at the upper pole parenchyma where there is a 9 mm stone, positioning possibly reflecting a calyceal diverticulum. Unremarkable bladder. Stomach/Bowel:  No obstruction. No appendicitis. Vascular/Lymphatic: No acute vascular abnormality. Scattered atheromatous calcification. No mass or adenopathy. Reproductive:No pathologic findings. Other: No ascites or pneumoperitoneum. Musculoskeletal: No acute abnormalities. 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 IMPRESSION: 1. Obstructing 7 x 5 mm stone at the right UVJ. 2. Numerous bilateral renal calculi. 3.  Aortic Atherosclerosis  (ICD10-I70.0). Electronically Signed   By: Tiburcio Pea M.D.   On: 06/07/2022 06:25    PROCEDURES:  Critical Care performed: N/A.  Procedures    MEDICATIONS ORDERED IN ED: Medications  oxyCODONE-acetaminophen (PERCOCET/ROXICET) 5-325 MG per tablet 1 tablet (1 tablet Oral Given 06/07/22 0730)  sodium chloride 0.9 % bolus 1,000 mL (0 mLs Intravenous Stopped 06/07/22 0922)  ketorolac (TORADOL) 15 MG/ML injection 15 mg (15 mg Intravenous Given 06/07/22 0812)  ondansetron (ZOFRAN) injection 4 mg (4 mg Intravenous Given 06/07/22 4403)     IMPRESSION / MDM / ASSESSMENT AND PLAN / ED COURSE  I reviewed the triage vital signs and the nursing notes.                              Differential diagnosis includes, but is not limited to, nephrolithiasis, ureterolithiasis, cystitis, pyelonephritis.  ED Course Patient appears uncomfortable, but stable.  Vitals are within normal limits for the patient.  CBC shows no leukocytosis or anemia.  CMP shows no AKI, transaminitis, or electrolyte abnormalities.  Urinalysis shows moderate leukocytes and small hemoglobin, otherwise no evidence of infection.   Assessment/Plan Patient presents with right-sided flank pain x 1 day.  CT scan shows 7 x 5 mm stone at the UVJ, which I believe is etiology of her symptoms.  She clinically appears well.  UA shows no signs of infection.  Labs are reassuring.  Symptoms controlled here with Percocet, ketorolac, and ondansetron.  I believe she is safe for discharge with urology follow-up.  She has been seen by [facility] in the past.  Patient agrees.  Will discharge her with prescriptions for Percocet, ondansetron, tamsulosin, and naproxen.  Considered admission for this patient, but given her stable presentation and access to close follow-up, she is unlikely to benefit from admission.  Provided the patient with anticipatory guidance, return precautions, and educational material. Encouraged the  patient to return to the emergency department at any time if they begin to experience any new or worsening symptoms. Patient expressed understanding and agreed with the plan.   Patient's presentation is most consistent with acute complicated illness / injury requiring diagnostic workup.       FINAL CLINICAL IMPRESSION(S) / ED DIAGNOSES   Final diagnoses:  Ureterolithiasis     Rx / DC Orders   ED Discharge Orders          Ordered    naproxen (NAPROSYN) 500 MG tablet  2 times daily with meals        06/07/22 0925    oxyCODONE-acetaminophen (PERCOCET) 5-325 MG tablet  Every 4 hours PRN        06/07/22 0925    tamsulosin (FLOMAX) 0.4 MG CAPS capsule  Daily        06/07/22 0930    ondansetron (ZOFRAN-ODT) 4 MG disintegrating tablet  Every 8 hours PRN        06/07/22 0931  Note:  This document was prepared using Dragon voice recognition software and may include unintentional dictation errors.   Varney Daily, [place] 06/07/22 2023    Arnaldo Natal, MD 06/07/22 [phone]

## 2022-06-07 NOTE — Discharge Instructions (Addendum)
-  Follow-up with [facility] ([address], [place], [place] [address]) if your symptoms do not improve within the next week.  You can call them at [phone] to schedule an appointment.  -Take the tamsulosin daily to help facilitate passage of the stone.  You may take the oxycodone as needed for pain (caution, may make you drowsy.), as well as the naproxen.  You may take the ondansetron as needed for nausea/vomiting.  -Return to the emergency department anytime if you begin to experience any new or worsening symptoms.

## 2024-01-05 ENCOUNTER — Emergency Department
Admission: EM | Admit: 2024-01-05 | Discharge: 2024-01-05 | Disposition: A | Discharge status: Home / Self Care | Payer: Self-pay | Attending: Emergency Medicine | Admitting: Emergency Medicine

## 2024-01-05 ENCOUNTER — Other Ambulatory Visit: Payer: Self-pay

## 2024-01-05 ENCOUNTER — Emergency Department: Payer: Self-pay

## 2024-01-05 DIAGNOSIS — E8809 Other disorders of plasma-protein metabolism, not elsewhere classified: Secondary | ICD-10-CM | POA: Insufficient documentation

## 2024-01-05 DIAGNOSIS — D508 Other iron deficiency anemias: Secondary | ICD-10-CM

## 2024-01-05 DIAGNOSIS — R109 Unspecified abdominal pain: Secondary | ICD-10-CM

## 2024-01-05 DIAGNOSIS — N131 Hydronephrosis with ureteral stricture, not elsewhere classified: Secondary | ICD-10-CM

## 2024-01-05 DIAGNOSIS — E871 Hypo-osmolality and hyponatremia: Secondary | ICD-10-CM | POA: Insufficient documentation

## 2024-01-05 DIAGNOSIS — N132 Hydronephrosis with renal and ureteral calculous obstruction: Secondary | ICD-10-CM | POA: Insufficient documentation

## 2024-01-05 LAB — COMPREHENSIVE METABOLIC PANEL
ALT: 27 U/L (ref 0–44)
AST: 23 U/L (ref 15–41)
Albumin: 3.1 g/dL — ABNORMAL LOW (ref 3.5–5.0)
Alkaline Phosphatase: 75 U/L (ref 38–126)
Anion gap: 11 (ref 5–15)
BUN: 10 mg/dL (ref 8–23)
CO2: 21 mmol/L — ABNORMAL LOW (ref 22–32)
Calcium: 9.1 mg/dL (ref 8.9–10.3)
Chloride: 101 mmol/L (ref 98–111)
Creatinine, Ser: 0.85 mg/dL (ref 0.44–1.00)
GFR, Estimated: >60 mL/min (ref >60)
Glucose, Bld: 114 mg/dL — ABNORMAL HIGH (ref 70–99)
Potassium: 3.7 mmol/L (ref 3.5–5.1)
Sodium: 133 mmol/L — ABNORMAL LOW (ref 135–145)
Total Bilirubin: 0.6 mg/dL (ref 0.0–1.2)
Total Protein: 8.1 g/dL (ref 6.5–8.1)

## 2024-01-05 LAB — LIPASE, BLOOD: Lipase: 22 U/L (ref 11–51)

## 2024-01-05 LAB — CBC WITH DIFFERENTIAL/PLATELET
Abs Immature Granulocytes: 0.06 10*3/uL (ref 0.00–0.07)
Basophils Absolute: 0 10*3/uL (ref 0.0–0.1)
Basophils Relative: 0 %
Eosinophils Absolute: 0 10*3/uL (ref 0.0–0.5)
Eosinophils Relative: 0 %
HCT: 31.2 % — ABNORMAL LOW (ref 36.0–46.0)
Hemoglobin: 10.6 g/dL — ABNORMAL LOW (ref 12.0–15.0)
Immature Granulocytes: 1 %
Lymphocytes Relative: 12 %
Lymphs Abs: 1.1 10*3/uL (ref 0.7–4.0)
MCH: 29.5 pg (ref 26.0–34.0)
MCHC: 34 g/dL (ref 30.0–36.0)
MCV: 86.9 fL (ref 80.0–100.0)
Monocytes Absolute: 0.6 10*3/uL (ref 0.1–1.0)
Monocytes Relative: 7 %
Neutro Abs: 7.4 10*3/uL (ref 1.7–7.7)
Neutrophils Relative %: 80 %
Platelets: 478 10*3/uL — ABNORMAL HIGH (ref 150–400)
RBC: 3.59 MIL/uL — ABNORMAL LOW (ref 3.87–5.11)
RDW: 13 % (ref 11.5–15.5)
WBC: 9.2 10*3/uL (ref 4.0–10.5)
nRBC: 0 % (ref 0.0–0.2)

## 2024-01-05 MED ORDER — ACETAMINOPHEN 500 MG PO TABS: 1000.0000 mg | ORAL_TABLET | Freq: Four times a day (QID) | ORAL | 2 refills | Status: AC | PRN

## 2024-01-05 MED ORDER — FERROUS SULFATE 325 (65 FE) MG PO TBEC: 325.0000 mg | DELAYED_RELEASE_TABLET | Freq: Every day | ORAL | 0 refills | Status: DC

## 2024-01-05 MED ORDER — KETOROLAC TROMETHAMINE 30 MG/ML IJ SOLN
30.0000 mg | Freq: Once | INTRAMUSCULAR | Status: AC
Start: 2024-01-05 — End: 2024-01-05
  Administered 2024-01-05: 30 mg via INTRAMUSCULAR
  Filled 2024-01-05: qty 1

## 2024-01-05 MED ORDER — TAMSULOSIN HCL 0.4 MG PO CAPS: 0.4000 mg | ORAL_CAPSULE | Freq: Every day | ORAL | 0 refills | Status: DC

## 2024-01-05 NOTE — ED Provider Triage Note (Signed)
 Emergency Medicine Provider Triage Evaluation Note  Belinda Oneill , a 63 y.o. female  was evaluated in triage.  Pt complains of right upper quadrant abdominal pain radiating into the right flank region.  Patient states this has been going on for 2 weeks.  Nausea vomiting, weight loss..  Review of Systems  Positive: Right upper quadrant abdominal pain radiating into the right flank region Negative: Hematuria, dysuria, hematic emesis  Physical Exam  BP 119/82   Pulse 80   Temp 98.7 F (37.1 C)   Resp 18   Ht 5' 4.5" (1.638 m)   Wt 59 kg   SpO2 100%   BMI 21.97 kg/m  Gen:   Awake, no distress   Resp:  Normal effort  MSK:   Moves extremities without difficulty  Other:    Medical Decision Making  Medically screening exam initiated at 4:10 PM.  Appropriate orders placed.  Belinda Oneill was informed that the remainder of the evaluation will be completed by another provider, this initial triage assessment does not replace that evaluation, and the importance of remaining in the ED until their evaluation is complete.  Patient with right upper quadrant abdominal pain radiating into the right flank region.  History of kidney stones but states that this feels different.  Patient will have CT scan, labs at this time   Racheal Patches, Cordelia Poche 01/05/24 1610

## 2024-01-05 NOTE — Discharge Instructions (Addendum)
 Have been diagnosed with urolithiasis, hydronephrosis, anemia.  Please take tamsulosin 1 capsule by mouth daily for 14 days, take ferrous sulfate 1 tablet by mouth daily with lunch and vitamin C.  Please take acetaminophen 2 capsules by mouth every 6 hours as needed for pain.  Please call [facility] to make an appointment and establish care.  Please call nephrology and make an appointment for a follow-up.  Please come back to ED or go to your PCP if you have new symptoms or symptoms worsen

## 2024-01-05 NOTE — ED Triage Notes (Addendum)
 Pt comes with lower belly pain that radiates to her back, loss of appetite, weight loss that started 3 weeks. Pt states nausea and no BM in week.   Pt states tightness that happens in her arm like cramping.

## 2024-01-05 NOTE — ED Provider Notes (Signed)
 [facility] Provider Note    Event Date/Time   First MD Initiated Contact with Patient 01/05/24 1839     (approximate)   History   Abdominal Pain   HPI  Belinda Oneill is a 63 y.o. female who presents today with history of 3 weeks of nauseas, vomit, anorexia, abdominal pain that is started in the epigastrium and now radiates to the back.  Patient denies diarrhea, dysuria, frequency.  Patient has history of urolithiasis.  Patient states she has been losing weight in the last 2 weeks.     Physical Exam   Triage Vital Signs: ED Triage Vitals  Encounter Vitals Group     BP 01/05/24 1609 119/82     Systolic BP Percentile --      Diastolic BP Percentile --      Pulse Rate 01/05/24 1609 80     Resp 01/05/24 1609 18     Temp 01/05/24 1609 98.7 F (37.1 C)     Temp src --      SpO2 01/05/24 1609 100 %     Weight 01/05/24 1608 130 lb (59 kg)     Height 01/05/24 1608 5' 4.5" (1.638 m)     Head Circumference --      Peak Flow --      Pain Score 01/05/24 1608 10     Pain Loc --      Pain Education --      Exclude from Growth Chart --     Most recent vital signs: Vitals:   01/05/24 1609  BP: 119/82  Pulse: 80  Resp: 18  Temp: 98.7 F (37.1 C)  SpO2: 100%     Constitutional: Alert, NAD. Able to speak in complete sentences without cough or dyspnea, mild distress due to pain Eyes: Conjunctivae are normal.  Head: Atraumatic. Nose: No congestion/rhinnorhea. Mouth/Throat: Mucous membranes are moist.   Neck: Painless ROM. Supple. No JVD, nodes, thyromegaly  Cardiovascular:   Good peripheral circulation.RRR no murmurs, gallops, rubs  Respiratory: Normal respiratory effort.  No retractions. Clear to auscultation bilaterally without wheezing or crackles  Gastrointestinal: Skin without scars, no ecchymosis, bowel sounds positive, Murphy positive, McBurney point negative, Rovsing negative, rebound negative.  Musculoskeletal:  no deformity Neurologic:   MAE spontaneously. No gross focal neurologic deficits are appreciated.  Skin:  Skin is warm, dry and intact. No rash noted. Psychiatric: Mood and affect are normal. Speech and behavior are normal.    ED Results / Procedures / Treatments   Labs (all labs ordered are listed, but only abnormal results are displayed) Labs Reviewed  COMPREHENSIVE METABOLIC PANEL - Abnormal; Notable for the following components:      Result Value   Sodium 133 (*)    CO2 21 (*)    Glucose, Bld 114 (*)    Albumin 3.1 (*)    All other components within normal limits  CBC WITH DIFFERENTIAL/PLATELET - Abnormal; Notable for the following components:   RBC 3.59 (*)    Hemoglobin 10.6 (*)    HCT 31.2 (*)    Platelets 478 (*)    All other components within normal limits  LIPASE, BLOOD  URINALYSIS, ROUTINE W REFLEX MICROSCOPIC     EKG    RADIOLOGY I independently reviewed and interpreted imaging and agree with radiologists findings.      PROCEDURES:  Critical Care performed:   Procedures   MEDICATIONS ORDERED IN ED: Medications  ketorolac (TORADOL) 30 MG/ML injection 30 mg (has no  administration in time range)   Clinical Course as of 01/05/24 2022  Mon Jan 05, 2024  1845 Comprehensive metabolic panel(!) Hyponatremia 133, hypoalbuminemia 3.1 [AE]  1846 CBC with Differential(!) Anemia hemoglobin 10.6 [AE]  1846 Lipase, blood Within normal limits [AE]    Clinical Course User Index [AE] Gladys Damme, PA-C    IMPRESSION / MDM / ASSESSMENT AND PLAN / ED COURSE  I reviewed the triage vital signs and the nursing notes.  Differential diagnosis includes, but is not limited to, cholecystitis, cholelithiasis, gastritis, intestinal obstruction  Patient's presentation is most consistent with acute complicated illness / injury requiring diagnostic workup.   Patient's diagnosis is consistent with bilateral urolithiasis, right hydronephrosis, anemia. I independently reviewed and interpreted  imaging and agree with radiologists findings. Labs are  reassuring. I did review the patient's allergies and medications.The patient is in stable and satisfactory condition for discharge home  Patient will be discharged home with prescriptions for tamsulosin, acetaminophen, sulfate ferrous. Patient is to follow up with PCP, nephrology as needed or otherwise directed. Patient is given ED precautions to return to the ED for any worsening or new symptoms. Discussed plan of care with patient, answered all of patient's questions, Patient agreeable to plan of care. Advised patient to take medications according to the instructions on the label. Discussed possible side effects of new medications. Patient verbalized understanding.    FINAL CLINICAL IMPRESSION(S) / ED DIAGNOSES   Final diagnoses:  Flank pain with history of urolithiasis  Other iron deficiency anemia  Hydronephrosis due to obstruction of ureteral orifice     Rx / DC Orders   ED Discharge Orders          Ordered    tamsulosin (FLOMAX) 0.4 MG CAPS capsule  Daily        01/05/24 2014    acetaminophen (TYLENOL) 500 MG tablet  Every 6 hours PRN        01/05/24 2014    ferrous sulfate 325 (65 FE) MG EC tablet  Daily with breakfast        01/05/24 2014             Note:  This document was prepared using Dragon voice recognition software and may include unintentional dictation errors.   Gladys Damme, PA-C 01/05/24 2022    Trinna Post, MD 01/05/24 [phone]

## 2024-01-14 ENCOUNTER — Inpatient Hospital Stay
Admission: EM | Admit: 2024-01-14 | Discharge: 2024-01-20 | DRG: 690 | Disposition: A | Discharge status: Home / Self Care | Payer: Self-pay | Attending: Internal Medicine | Admitting: Internal Medicine

## 2024-01-14 ENCOUNTER — Encounter: Payer: Self-pay | Admitting: Family Medicine

## 2024-01-14 ENCOUNTER — Emergency Department: Payer: Self-pay

## 2024-01-14 DIAGNOSIS — N202 Calculus of kidney with calculus of ureter: Secondary | ICD-10-CM | POA: Diagnosis present

## 2024-01-14 DIAGNOSIS — K219 Gastro-esophageal reflux disease without esophagitis: Secondary | ICD-10-CM | POA: Insufficient documentation

## 2024-01-14 DIAGNOSIS — D75838 Other thrombocytosis: Secondary | ICD-10-CM | POA: Insufficient documentation

## 2024-01-14 DIAGNOSIS — E871 Hypo-osmolality and hyponatremia: Secondary | ICD-10-CM | POA: Diagnosis present

## 2024-01-14 DIAGNOSIS — N139 Obstructive and reflux uropathy, unspecified: Secondary | ICD-10-CM | POA: Diagnosis present

## 2024-01-14 DIAGNOSIS — Z720 Tobacco use: Secondary | ICD-10-CM

## 2024-01-14 DIAGNOSIS — Z1152 Encounter for screening for COVID-19: Secondary | ICD-10-CM

## 2024-01-14 DIAGNOSIS — A498 Other bacterial infections of unspecified site: Secondary | ICD-10-CM | POA: Diagnosis present

## 2024-01-14 DIAGNOSIS — Z87442 Personal history of urinary calculi: Secondary | ICD-10-CM

## 2024-01-14 DIAGNOSIS — N10 Acute tubulo-interstitial nephritis: Secondary | ICD-10-CM | POA: Insufficient documentation

## 2024-01-14 DIAGNOSIS — R109 Unspecified abdominal pain: Principal | ICD-10-CM

## 2024-01-14 DIAGNOSIS — N136 Pyonephrosis: Principal | ICD-10-CM | POA: Diagnosis present

## 2024-01-14 DIAGNOSIS — N390 Urinary tract infection, site not specified: Secondary | ICD-10-CM

## 2024-01-14 DIAGNOSIS — R030 Elevated blood-pressure reading, without diagnosis of hypertension: Secondary | ICD-10-CM

## 2024-01-14 DIAGNOSIS — Z79899 Other long term (current) drug therapy: Secondary | ICD-10-CM

## 2024-01-14 DIAGNOSIS — F17210 Nicotine dependence, cigarettes, uncomplicated: Secondary | ICD-10-CM | POA: Diagnosis present

## 2024-01-14 DIAGNOSIS — Z716 Tobacco abuse counseling: Secondary | ICD-10-CM

## 2024-01-14 DIAGNOSIS — D638 Anemia in other chronic diseases classified elsewhere: Secondary | ICD-10-CM | POA: Diagnosis present

## 2024-01-14 DIAGNOSIS — I10 Essential (primary) hypertension: Secondary | ICD-10-CM

## 2024-01-14 DIAGNOSIS — B964 Proteus (mirabilis) (morganii) as the cause of diseases classified elsewhere: Secondary | ICD-10-CM | POA: Diagnosis present

## 2024-01-14 HISTORY — DX: Essential (primary) hypertension: I10

## 2024-01-14 LAB — URINALYSIS, W/ REFLEX TO CULTURE (INFECTION SUSPECTED)
Bilirubin Urine: NEGATIVE
Glucose, UA: NEGATIVE mg/dL
Hgb urine dipstick: NEGATIVE
Ketones, ur: 5 mg/dL — AB
Nitrite: NEGATIVE
Protein, ur: 30 mg/dL — AB
Specific Gravity, Urine: 1.02 (ref 1.005–1.030)
WBC, UA: >50 WBC/hpf (ref 0–5)
pH: 6 (ref 5.0–8.0)

## 2024-01-14 LAB — COMPREHENSIVE METABOLIC PANEL
ALT: 27 U/L (ref 0–44)
AST: 22 U/L (ref 15–41)
Albumin: 2.7 g/dL — ABNORMAL LOW (ref 3.5–5.0)
Alkaline Phosphatase: 93 U/L (ref 38–126)
Anion gap: 8 (ref 5–15)
BUN: 11 mg/dL (ref 8–23)
CO2: 26 mmol/L (ref 22–32)
Calcium: 9.6 mg/dL (ref 8.9–10.3)
Chloride: 96 mmol/L — ABNORMAL LOW (ref 98–111)
Creatinine, Ser: 0.85 mg/dL (ref 0.44–1.00)
GFR, Estimated: >60 mL/min (ref >60)
Glucose, Bld: 130 mg/dL — ABNORMAL HIGH (ref 70–99)
Potassium: 4.3 mmol/L (ref 3.5–5.1)
Sodium: 130 mmol/L — ABNORMAL LOW (ref 135–145)
Total Bilirubin: 0.6 mg/dL (ref 0.0–1.2)
Total Protein: 8.5 g/dL — ABNORMAL HIGH (ref 6.5–8.1)

## 2024-01-14 LAB — CBC WITH DIFFERENTIAL/PLATELET
Abs Immature Granulocytes: 0.13 10*3/uL — ABNORMAL HIGH (ref 0.00–0.07)
Basophils Absolute: 0 10*3/uL (ref 0.0–0.1)
Basophils Relative: 0 %
Eosinophils Absolute: 0 10*3/uL (ref 0.0–0.5)
Eosinophils Relative: 0 %
HCT: 31.1 % — ABNORMAL LOW (ref 36.0–46.0)
Hemoglobin: 10.3 g/dL — ABNORMAL LOW (ref 12.0–15.0)
Immature Granulocytes: 1 %
Lymphocytes Relative: 11 %
Lymphs Abs: 1.3 10*3/uL (ref 0.7–4.0)
MCH: 28.5 pg (ref 26.0–34.0)
MCHC: 33.1 g/dL (ref 30.0–36.0)
MCV: 86.1 fL (ref 80.0–100.0)
Monocytes Absolute: 0.7 10*3/uL (ref 0.1–1.0)
Monocytes Relative: 6 %
Neutro Abs: 9.8 10*3/uL — ABNORMAL HIGH (ref 1.7–7.7)
Neutrophils Relative %: 82 %
Platelets: 644 10*3/uL — ABNORMAL HIGH (ref 150–400)
RBC: 3.61 MIL/uL — ABNORMAL LOW (ref 3.87–5.11)
RDW: 13.4 % (ref 11.5–15.5)
WBC: 11.9 10*3/uL — ABNORMAL HIGH (ref 4.0–10.5)
nRBC: 0 % (ref 0.0–0.2)

## 2024-01-14 LAB — LACTIC ACID, PLASMA: Lactic Acid, Venous: 1 mmol/L (ref 0.5–1.9)

## 2024-01-14 MED ORDER — ENOXAPARIN SODIUM 40 MG/0.4ML IJ SOSY
40.0000 mg | PREFILLED_SYRINGE | INTRAMUSCULAR | Status: DC
  Administered 2024-01-15 – 2024-01-19 (×5): 40 mg via SUBCUTANEOUS
  Filled 2024-01-14 (×6): qty 0.4

## 2024-01-14 MED ORDER — ACETAMINOPHEN 650 MG RE SUPP: 650.0000 mg | Freq: Four times a day (QID) | RECTAL | Status: DC | PRN

## 2024-01-14 MED ORDER — SODIUM CHLORIDE 0.9 % IV SOLN: INTRAVENOUS | Status: AC

## 2024-01-14 MED ORDER — TAMSULOSIN HCL 0.4 MG PO CAPS
0.4000 mg | ORAL_CAPSULE | Freq: Every day | ORAL | Status: DC
  Administered 2024-01-14 – 2024-01-20 (×7): 0.4 mg via ORAL
  Filled 2024-01-14 (×7): qty 1

## 2024-01-14 MED ORDER — TRAZODONE HCL 50 MG PO TABS
25.0000 mg | ORAL_TABLET | Freq: Every evening | ORAL | Status: DC | PRN
  Administered 2024-01-14 – 2024-01-19 (×5): 25 mg via ORAL
  Filled 2024-01-14 (×5): qty 1

## 2024-01-14 MED ORDER — PANTOPRAZOLE SODIUM 40 MG PO TBEC
40.0000 mg | DELAYED_RELEASE_TABLET | Freq: Every day | ORAL | Status: DC
  Administered 2024-01-14 – 2024-01-20 (×7): 40 mg via ORAL
  Filled 2024-01-14 (×7): qty 1

## 2024-01-14 MED ORDER — MAGNESIUM HYDROXIDE 400 MG/5ML PO SUSP: 30.0000 mL | Freq: Every day | ORAL | Status: DC | PRN

## 2024-01-14 MED ORDER — SODIUM CHLORIDE 0.9 % IV BOLUS
500.0000 mL | Freq: Once | INTRAVENOUS | Status: AC
  Administered 2024-01-14: 500 mL via INTRAVENOUS

## 2024-01-14 MED ORDER — ONDANSETRON HCL 4 MG/2ML IJ SOLN: 4.0000 mg | Freq: Four times a day (QID) | INTRAMUSCULAR | Status: DC | PRN

## 2024-01-14 MED ORDER — FAMOTIDINE 20 MG PO TABS
20.0000 mg | ORAL_TABLET | Freq: Two times a day (BID) | ORAL | Status: DC
  Administered 2024-01-15 – 2024-01-20 (×11): 20 mg via ORAL
  Filled 2024-01-14 (×12): qty 1

## 2024-01-14 MED ORDER — MORPHINE SULFATE (PF) 4 MG/ML IV SOLN
4.0000 mg | Freq: Once | INTRAVENOUS | Status: AC
  Administered 2024-01-14: 4 mg via INTRAVENOUS
  Filled 2024-01-14: qty 1

## 2024-01-14 MED ORDER — ACETAMINOPHEN 325 MG PO TABS
650.0000 mg | ORAL_TABLET | Freq: Four times a day (QID) | ORAL | Status: DC | PRN
  Administered 2024-01-14 – 2024-01-17 (×4): 650 mg via ORAL
  Filled 2024-01-14 (×4): qty 2

## 2024-01-14 MED ORDER — FERROUS SULFATE 325 (65 FE) MG PO TABS
325.0000 mg | ORAL_TABLET | Freq: Every day | ORAL | Status: DC
  Administered 2024-01-15 – 2024-01-16 (×2): 325 mg via ORAL
  Filled 2024-01-14 (×2): qty 1

## 2024-01-14 MED ORDER — ONDANSETRON HCL 4 MG PO TABS: 4.0000 mg | ORAL_TABLET | Freq: Four times a day (QID) | ORAL | Status: DC | PRN

## 2024-01-14 MED ORDER — SODIUM CHLORIDE 0.9 % IV BOLUS
1000.0000 mL | Freq: Once | INTRAVENOUS | Status: AC
  Administered 2024-01-14: 1000 mL via INTRAVENOUS

## 2024-01-14 MED ORDER — SODIUM CHLORIDE 0.9 % IV SOLN
2.0000 g | INTRAVENOUS | Status: DC
  Filled 2024-01-14 (×2): qty 20

## 2024-01-14 MED ORDER — SODIUM CHLORIDE 0.9 % IV SOLN
1.0000 g | Freq: Once | INTRAVENOUS | Status: AC
  Administered 2024-01-14: 1 g via INTRAVENOUS
  Filled 2024-01-14: qty 10

## 2024-01-14 NOTE — Assessment & Plan Note (Signed)
She was counseled for smoking cessation and will receive further counseling here. 

## 2024-01-14 NOTE — ED Notes (Signed)
 Pt up to bedside commode w/ out assistance.

## 2024-01-14 NOTE — ED Triage Notes (Signed)
 Pt complains of right side abd pain that radiate to right side of back. Was seen here on 3/10 for the same and pain has worsen. Pt was dx with hydronephrosis.

## 2024-01-14 NOTE — ED Provider Notes (Signed)
 [facility] Provider Note    Event Date/Time   First MD Initiated Contact with Patient 01/14/24 1635     (approximate)   History   Abdominal Pain   HPI  Belinda Oneill is a 63 y.o. female   who presents to the emergency department today because of concerns for continued right sided flank pain.  The patient was seen in the emergency department 9 days ago for similar pain.  At that time had CT scan done which showed a proximal right ureteral stone causing right hydronephrosis.  Patient says that since discharge from the emergency department she has had continued and worsening pain.  Has had low-grade fevers with max temp of 100.1.  She has had decreased appetite.      Physical Exam   Triage Vital Signs: ED Triage Vitals  Encounter Vitals Group     BP 01/14/24 1546 118/70     Systolic BP Percentile --      Diastolic BP Percentile --      Pulse Rate 01/14/24 1546 (!) 118     Resp 01/14/24 1549 18     Temp 01/14/24 1546 99.5 F (37.5 C)     Temp Source 01/14/24 1546 Oral     SpO2 01/14/24 1546 95 %     Weight --      Height --      Head Circumference --      Peak Flow --      Pain Score 01/14/24 1547 10     Pain Loc --      Pain Education --      Exclude from Growth Chart --     Most recent vital signs: Vitals:   01/14/24 1549 01/14/24 1917  BP:  (!) 177/76  Pulse:  (!) 122  Resp: 18 18  Temp:  98.9 F (37.2 C)  SpO2:  100%   General: Awake, alert, oriented. CV:  Good peripheral perfusion.  Resp:  Normal effort.  Abd:  No distention. Tender to palpation of the right flank.   ED Results / Procedures / Treatments   Labs (all labs ordered are listed, but only abnormal results are displayed) Labs Reviewed  COMPREHENSIVE METABOLIC PANEL - Abnormal; Notable for the following components:      Result Value   Sodium 130 (*)    Chloride 96 (*)    Glucose, Bld 130 (*)    Total Protein 8.5 (*)    Albumin 2.7 (*)    All other components  within normal limits  CBC WITH DIFFERENTIAL/PLATELET - Abnormal; Notable for the following components:   WBC 11.9 (*)    RBC 3.61 (*)    Hemoglobin 10.3 (*)    HCT 31.1 (*)    Platelets 644 (*)    Neutro Abs 9.8 (*)    Abs Immature Granulocytes 0.13 (*)    All other components within normal limits  URINALYSIS, W/ REFLEX TO CULTURE (INFECTION SUSPECTED) - Abnormal; Notable for the following components:   Color, Urine YELLOW (*)    APPearance HAZY (*)    Ketones, ur 5 (*)    Protein, ur 30 (*)    Leukocytes,Ua LARGE (*)    Bacteria, UA FEW (*)    All other components within normal limits  URINE CULTURE  LACTIC ACID, PLASMA  LACTIC ACID, PLASMA     EKG  None   RADIOLOGY I independently interpreted and visualized the CXR. My interpretation: No pneumonia Radiology interpretation:  IMPRESSION:  No active cardiopulmonary disease.      PROCEDURES:  Critical Care performed: None  MEDICATIONS ORDERED IN ED: Medications  cefTRIAXone (ROCEPHIN) 1 g in sodium chloride 0.9 % 100 mL IVPB (1 g Intravenous New Bag/Given 01/14/24 1753)     IMPRESSION / MDM / ASSESSMENT AND PLAN / ED COURSE  I reviewed the triage vital signs and the nursing notes.                              Differential diagnosis includes, but is not limited to, kidney stone, hydronephrosis, UTI  Patient's presentation is most consistent with acute presentation with potential threat to life or bodily function.   Patient presented to the emergency department today because of concerns for continued right flank pain.  In review of patient's chart she had a CT performed 9 days ago which showed a proximal right ureteral stone.  It also showed hydro at that time.  Patient is here today with some findings concerning for possible infection.  I discussed with Dr. Apolinar Junes with urology.  Did not feel any emergent intervention was necessary at this time. Patient was given dose of IV abx here in the emergency department.  Discussed with Dr. Arville Care with the hospitalist service who will evaluate for admission.    FINAL CLINICAL IMPRESSION(S) / ED DIAGNOSES   Final diagnoses:  Right flank pain     Note:  This document was prepared using Dragon voice recognition software and may include unintentional dictation errors.    Phineas Semen, MD 01/14/24 2029

## 2024-01-14 NOTE — Assessment & Plan Note (Signed)
 Marland Kitchen

## 2024-01-14 NOTE — Assessment & Plan Note (Signed)
-   This could be associated with early pyelonephritis. - We will continue IV Rocephin. - We will follow urine culture.

## 2024-01-14 NOTE — ED Notes (Signed)
 Pt assisted to the bathroom by this EDT. Pt steady on feet. Pt assisted back in bed. When back in bed pt requested crackers and drink. This EDT checked with pts RN to make sure pt was able to eat and drink. Pt given what they requested. Pt is now back in bed. Bed locked and in lowest position. Side rails up and call light within reach, No further needs at this time.

## 2024-01-14 NOTE — Assessment & Plan Note (Signed)
-   This is likely secondary to her pain.  Her BP is currently soft.  Will closely monitor it.

## 2024-01-14 NOTE — Assessment & Plan Note (Addendum)
-   We will continue PPI therapy and H2 blocker therapy.

## 2024-01-14 NOTE — H&P (Addendum)
 [place]   PATIENT NAME: Belinda Oneill    MR#:  [id number]  DATE OF BIRTH:  08-05-1961  DATE OF ADMISSION:  01/14/2024  PRIMARY CARE PHYSICIAN: Pcp, No   Patient is coming from: Home  REQUESTING/REFERRING PHYSICIAN: Phineas Semen, MD  CHIEF COMPLAINT:   Chief Complaint  Patient presents with   Abdominal Pain    HISTORY OF PRESENT ILLNESS:  Belinda Oneill is a 63 y.o. African-American female with medical history significant for urolithiasis, who presented to the ER with acute onset of right flank pain which has been intermittent over the last 2 to 3 weeks, with associated urinary frequency and urgency as well as tactile fever and chills.  Tmax at home was 100.1.  She has been having nausea without vomiting or diarrhea.  No chest pain or palpitations.  No cough or wheezing or dyspnea.  No bleeding diathesis.  ED Course: When the patient came to the ER, heart rate was 118 and temperature 99.5 with otherwise unremarkable vital signs.  Labs revealed mild hyponatremia will chloremia albumin 2.7 with total protein 8.5.  Lactic acid was 1 and CBC showed leukocytosis 11.9 with neutrophilia and hemoglobin 10.3 and hematocrit 31.1 with platelets of 644.  UA was positive for UTI.  Urine culture was sent. EKG as reviewed by me : None Imaging: Two-view chest x-ray showed no acute cardiopulmonary disease. On 01/05/2024 abdominal pelvic CT scan without contrast revealed the following: 1. Multiple large stones in the right renal pelvis extending into the proximal right ureter with moderate to severe right hydronephrosis. 2. Multiple nonobstructing left renal calculi. No hydronephrosis on the left. 3. No bowel obstruction. Normal appendix.  The patient was given 4 mg IV morphine sulfate, 1.5 L of IV normal saline and 1 g of IV Rocephin.  She will be admitted to a medical bed for further evaluation and management. PAST MEDICAL HISTORY:   Past Medical History:  Diagnosis Date    Essential hypertension 01/14/2024   Kidney stones    Kidney stones     PAST SURGICAL HISTORY:   Past Surgical History:  Procedure Laterality Date   LITHOTRIPSY      SOCIAL HISTORY:   Social History   Tobacco Use   Smoking status: Every Day    Current packs/day: 0.25    Average packs/day: 0.3 packs/day for 40.0 years (10.0 ttl pk-yrs)    Types: Cigarettes   Smokeless tobacco: Never  Substance Use Topics   Alcohol use: Yes    Comment: occ    FAMILY HISTORY:   Family History  Problem Relation Age of Onset   Breast cancer Maternal Aunt     DRUG ALLERGIES:  No Known Allergies  REVIEW OF SYSTEMS:   ROS As per history of present illness. All pertinent systems were reviewed above. Constitutional, HEENT, cardiovascular, respiratory, GI, GU, musculoskeletal, neuro, psychiatric, endocrine, integumentary and hematologic systems were reviewed and are otherwise negative/unremarkable except for positive findings mentioned above in the HPI.   MEDICATIONS AT HOME:   Prior to Admission medications   Medication Sig Start Date End Date Taking? Authorizing Provider  acetaminophen (TYLENOL) 500 MG tablet Take 2 tablets (1,000 mg total) by mouth every 6 (six) hours as needed. 01/05/24 01/04/25 Yes Evans, Alexandra, PA-C  famotidine (PEPCID) 20 MG tablet Take 1 tablet (20 mg total) by mouth 2 (two) times daily. 04/02/20 06/01/20  Miguel Aschoff., MD  ferrous sulfate 325 (65 FE) MG EC tablet Take 1 tablet (325 mg  total) by mouth daily with breakfast. 01/05/24   Gladys Damme, PA-C  furosemide (LASIX) 20 MG tablet Take 1 tablet (20 mg total) by mouth daily. 05/30/17 05/30/18  Emily Filbert, MD  pantoprazole (PROTONIX) 40 MG tablet Take 1 tablet (40 mg total) by mouth daily. 08/09/19 08/08/20  Minna Antis, MD  tamsulosin (FLOMAX) 0.4 MG CAPS capsule Take 1 capsule (0.4 mg total) by mouth daily for 14 days. 01/05/24 01/19/24  Gladys Damme, PA-C      VITAL SIGNS:  Blood pressure  (!) 177/76, pulse (!) 122, temperature 98.9 F (37.2 C), temperature source Oral, resp. rate 18, SpO2 100%.  PHYSICAL EXAMINATION:  Physical Exam  GENERAL:  63 y.o.-year-old African-American female patient lying in the bed with no acute distress.  EYES: Pupils equal, round, reactive to light and accommodation. No scleral icterus. Extraocular muscles intact.  HEENT: Head atraumatic, normocephalic. Oropharynx and nasopharynx clear.  NECK:  Supple, no jugular venous distention. No thyroid enlargement, no tenderness.  LUNGS: Normal breath sounds bilaterally, no wheezing, rales,rhonchi or crepitation. No use of accessory muscles of respiration.  CARDIOVASCULAR: Regular rate and rhythm, S1, S2 normal. No murmurs, rubs, or gallops.  ABDOMEN: Soft, nondistended, nontender. Bowel sounds present. No organomegaly or mass.  Right CVA tenderness. EXTREMITIES: No pedal edema, cyanosis, or clubbing.  NEUROLOGIC: Cranial nerves II through XII are intact. Muscle strength 5/5 in all extremities. Sensation intact. Gait not checked.  PSYCHIATRIC: The patient is alert and oriented x 3.  Normal affect and good eye contact. SKIN: No obvious rash, lesion, or ulcer.   LABORATORY PANEL:   CBC Recent Labs  Lab 01/14/24 1549  WBC 11.9*  HGB 10.3*  HCT 31.1*  PLT 644*   ------------------------------------------------------------------------------------------------------------------  Chemistries  Recent Labs  Lab 01/14/24 1549  NA 130*  K 4.3  CL 96*  CO2 26  GLUCOSE 130*  BUN 11  CREATININE 0.85  CALCIUM 9.6  AST 22  ALT 27  ALKPHOS 93  BILITOT 0.6   ------------------------------------------------------------------------------------------------------------------  Cardiac Enzymes No results for input(s): "TROPONINI" in the last 168 hours. ------------------------------------------------------------------------------------------------------------------  RADIOLOGY:  DG Chest 2 View Result  Date: 01/14/2024 CLINICAL DATA:  Upper abdominal pain. EXAM: CHEST - 2 VIEW COMPARISON:  Chest radiograph 05/21/2022. Included lung bases from abdominopelvic CT 01/05/2024 FINDINGS: The cardiomediastinal contours are normal. The lungs are clear. Pulmonary vasculature is normal. No consolidation, pleural effusion, or pneumothorax. No acute osseous abnormalities are seen. Left breast calcification. IMPRESSION: No active cardiopulmonary disease. Electronically Signed   By: Narda Rutherford M.D.   On: 01/14/2024 17:23      IMPRESSION AND PLAN:  Assessment and Plan: * Acute unilateral obstructive uropathy - The patient was admitted to a medical bed. - Pain management will be provided. - We will continue IV Rocephin. - Urology consult will be obtained. - Dr. Apolinar Junes was notified about the patient.  At this time there was no recommendation for repeat renal stone CT. - We will keep the patient n.p.o. after midnight for potential cysto-ureteroscopy and right ureteral stent in a.m..  Acute lower UTI - This could be associated with early pyelonephritis. - We will continue IV Rocephin. - We will follow urine culture.  Elevated blood pressure reading - This is likely secondary to her pain.  Her BP is currently soft.  Will closely monitor it.  Tobacco abuse - She was counseled for smoking cessation and will receive further counseling here.  GERD without esophagitis - We will continue PPI therapy and H2 blocker  therapy..    DVT prophylaxis: Lovenox.  Advanced Care Planning:  Code Status: full code.  Family Communication:  The plan of care was discussed in details with the patient (and family). I answered all questions. The patient agreed to proceed with the above mentioned plan. Further management will depend upon hospital course. Disposition Plan: Back to previous home environment Consults called: Urology. All the records are reviewed and case discussed with ED provider.  Status is:  Inpatient   At the time of the admission, it appears that the appropriate admission status for this patient is inpatient.  This is judged to be reasonable and necessary in order to provide the required intensity of service to ensure the patient's safety given the presenting symptoms, physical exam findings and initial radiographic and laboratory data in the context of comorbid conditions.  The patient requires inpatient status due to high intensity of service, high risk of further deterioration and high frequency of surveillance required.  I certify that at the time of admission, it is my clinical judgment that the patient will require inpatient hospital care extending more than 2 midnights.                            Dispo: The patient is from: Home              Anticipated d/c is to: Home              Patient currently is not medically stable to d/c.              Difficult to place patient: No  Hannah Beat M.D on 01/14/2024 at 10:32 PM  [facility]   From 7 PM-7 AM, contact night-coverage [email or web address]  CC: Primary care physician; Pcp, No

## 2024-01-14 NOTE — Assessment & Plan Note (Addendum)
-   The patient was admitted to a medical bed. - Pain management will be provided. - We will continue IV Rocephin. - Urology consult will be obtained. - Dr. Apolinar Junes was notified about the patient.  At this time there was no recommendation for repeat renal stone CT. - We will keep the patient n.p.o. after midnight for potential cysto-ureteroscopy and right ureteral stent in a.m.Marland Kitchen

## 2024-01-15 ENCOUNTER — Encounter: Payer: Self-pay | Admitting: Family Medicine

## 2024-01-15 ENCOUNTER — Other Ambulatory Visit: Payer: Self-pay

## 2024-01-15 ENCOUNTER — Inpatient Hospital Stay: Payer: Self-pay | Admitting: Radiology

## 2024-01-15 DIAGNOSIS — N10 Acute tubulo-interstitial nephritis: Secondary | ICD-10-CM

## 2024-01-15 DIAGNOSIS — N1330 Unspecified hydronephrosis: Secondary | ICD-10-CM

## 2024-01-15 DIAGNOSIS — N12 Tubulo-interstitial nephritis, not specified as acute or chronic: Secondary | ICD-10-CM

## 2024-01-15 DIAGNOSIS — D75838 Other thrombocytosis: Secondary | ICD-10-CM

## 2024-01-15 DIAGNOSIS — N200 Calculus of kidney: Secondary | ICD-10-CM

## 2024-01-15 HISTORY — PX: IR NEPHROSTOMY PLACEMENT RIGHT: IMG6064

## 2024-01-15 LAB — BASIC METABOLIC PANEL
Anion gap: 10 (ref 5–15)
BUN: 7 mg/dL — ABNORMAL LOW (ref 8–23)
CO2: 22 mmol/L (ref 22–32)
Calcium: 8.6 mg/dL — ABNORMAL LOW (ref 8.9–10.3)
Chloride: 104 mmol/L (ref 98–111)
Creatinine, Ser: 0.79 mg/dL (ref 0.44–1.00)
GFR, Estimated: >60 mL/min (ref >60)
Glucose, Bld: 110 mg/dL — ABNORMAL HIGH (ref 70–99)
Potassium: 3.9 mmol/L (ref 3.5–5.1)
Sodium: 136 mmol/L (ref 135–145)

## 2024-01-15 LAB — LACTIC ACID, PLASMA: Lactic Acid, Venous: 0.9 mmol/L (ref 0.5–1.9)

## 2024-01-15 LAB — CBC
HCT: 25.6 % — ABNORMAL LOW (ref 36.0–46.0)
Hemoglobin: 8.6 g/dL — ABNORMAL LOW (ref 12.0–15.0)
MCH: 28.7 pg (ref 26.0–34.0)
MCHC: 33.6 g/dL (ref 30.0–36.0)
MCV: 85.3 fL (ref 80.0–100.0)
Platelets: 566 10*3/uL — ABNORMAL HIGH (ref 150–400)
RBC: 3 MIL/uL — ABNORMAL LOW (ref 3.87–5.11)
RDW: 13.4 % (ref 11.5–15.5)
WBC: 12.4 10*3/uL — ABNORMAL HIGH (ref 4.0–10.5)
nRBC: 0 % (ref 0.0–0.2)

## 2024-01-15 LAB — PROTIME-INR
INR: 1.3 — ABNORMAL HIGH (ref 0.8–1.2)
Prothrombin Time: 16.2 s — ABNORMAL HIGH (ref 11.4–15.2)

## 2024-01-15 LAB — PLATELET FUNCTION ASSAY: Collagen / Epinephrine: 111 s (ref 0–193)

## 2024-01-15 LAB — HIV ANTIBODY (ROUTINE TESTING W REFLEX): HIV Screen 4th Generation wRfx: NONREACTIVE

## 2024-01-15 LAB — APTT: aPTT: 43 s — ABNORMAL HIGH (ref 24–36)

## 2024-01-15 MED ORDER — IOHEXOL 300 MG/ML  SOLN
12.0000 mL | Freq: Once | INTRAMUSCULAR | Status: AC | PRN
  Administered 2024-01-15: 12 mL

## 2024-01-15 MED ORDER — FENTANYL CITRATE (PF) 100 MCG/2ML IJ SOLN
INTRAMUSCULAR | Status: AC
  Filled 2024-01-15: qty 2

## 2024-01-15 MED ORDER — FENTANYL CITRATE (PF) 100 MCG/2ML IJ SOLN
INTRAMUSCULAR | Status: AC | PRN
  Administered 2024-01-15 (×3): 25 ug via INTRAVENOUS
  Administered 2024-01-15: 50 ug via INTRAVENOUS

## 2024-01-15 MED ORDER — HYDROMORPHONE HCL 1 MG/ML IJ SOLN
0.5000 mg | INTRAMUSCULAR | Status: DC | PRN
  Administered 2024-01-15 – 2024-01-18 (×11): 0.5 mg via INTRAVENOUS
  Filled 2024-01-15 (×11): qty 0.5

## 2024-01-15 MED ORDER — ACETAMINOPHEN 325 MG PO TABS
ORAL_TABLET | ORAL | Status: AC
  Filled 2024-01-15: qty 2

## 2024-01-15 MED ORDER — LIDOCAINE HCL 1 % IJ SOLN
16.0000 mL | Freq: Once | INTRAMUSCULAR | Status: AC
  Administered 2024-01-15: 16 mL
  Filled 2024-01-15: qty 16

## 2024-01-15 MED ORDER — MIDAZOLAM HCL 2 MG/2ML IJ SOLN
INTRAMUSCULAR | Status: AC
  Filled 2024-01-15: qty 2

## 2024-01-15 MED ORDER — MEPERIDINE HCL 25 MG/ML IJ SOLN
INTRAMUSCULAR | Status: AC
  Filled 2024-01-15: qty 1

## 2024-01-15 MED ORDER — SODIUM CHLORIDE 0.9 % IV SOLN
2.0000 g | INTRAVENOUS | Status: DC
  Administered 2024-01-15 – 2024-01-19 (×5): 2 g via INTRAVENOUS
  Filled 2024-01-15 (×6): qty 20

## 2024-01-15 MED ORDER — SODIUM CHLORIDE 0.9% FLUSH
5.0000 mL | Freq: Three times a day (TID) | INTRAVENOUS | Status: DC
  Administered 2024-01-15 – 2024-01-20 (×13): 5 mL

## 2024-01-15 MED ORDER — MIDAZOLAM HCL 2 MG/2ML IJ SOLN
INTRAMUSCULAR | Status: AC | PRN
  Administered 2024-01-15 (×2): .5 mg via INTRAVENOUS
  Administered 2024-01-15: 1 mg via INTRAVENOUS
  Administered 2024-01-15: .5 mg via INTRAVENOUS

## 2024-01-15 MED ORDER — LIDOCAINE HCL 1 % IJ SOLN
INTRAMUSCULAR | Status: AC
  Filled 2024-01-15: qty 20

## 2024-01-15 MED ORDER — OXYCODONE-ACETAMINOPHEN 5-325 MG PO TABS
1.0000 | ORAL_TABLET | ORAL | Status: DC | PRN
  Administered 2024-01-15 – 2024-01-18 (×11): 1 via ORAL
  Filled 2024-01-15 (×11): qty 1

## 2024-01-15 MED ORDER — HYDROMORPHONE HCL 1 MG/ML IJ SOLN
INTRAMUSCULAR | Status: AC
  Filled 2024-01-15: qty 0.5

## 2024-01-15 NOTE — Progress Notes (Signed)
  Progress Note   Patient: Belinda Oneill ZOX:[id number] DOB: 1961-07-16 DOA: 01/14/2024     1 DOS: the patient was seen and examined on 01/15/2024   Brief hospital course: Belinda Oneill is a 63 y.o. African-American female with medical history significant for urolithiasis, who presented to the ER with acute onset of right flank pain which has been intermittent over the last 2 to 3 weeks, with associated urinary frequency and urgency as well as tactile fever and chills.  Tmax at home was 100.1.  She had a CT scan on 3/10, showed multiple large stones in the right renal pelvis extending into the proximal right ureter with a moderate to severe right hydronephrosis.  He came back to the emergency room with worsening back pain and a fever.  He was diagnosed with acute pyelonephritis, was started on IV antibiotics.  Urology consult obtained, patient is going to have a urostomy tube placed.   Principal Problem:   Acute unilateral obstructive uropathy Active Problems:   Elevated blood pressure reading   Acute lower UTI   Tobacco abuse   GERD without esophagitis   Assessment and Plan: * Acute unilateral obstructive uropathy Acute pyelonephritis. Patient did not meet sepsis criteria at the time of admission, sepsis ruled out. Urine culture sent out, pending results.  Blood culture was not sent. Continue antibiotics Patient is scheduled to have left nephrostomy placed today due to multiple large stones. Will continue some IV fluids today.  Continue symptomatic treatment with pain medicine.  Hyponatremia. Sodium level is better after giving fluids.  Anemia. Reactive thrombocytosis. Check iron B12 level, recheck CBC tomorrow, no need of transfusion at this time.  No active bleeding.  Tobacco abuse Smoking cessation advised.  GERD without esophagitis - We will continue PPI therapy and H2 blocker therapy..       Subjective:  Patient still complaining of severe flank pain, no  additional fever after arriving to hospital.  No nausea vomiting.  Physical Exam: Vitals:   01/15/24 0002 01/15/24 0006 01/15/24 0349 01/15/24 0817  BP:  100/66 118/63 103/66  Pulse: 95 95 99 94  Resp:  16 16 16   Temp:  99.1 F (37.3 C) 99 F (37.2 C) 98.5 F (36.9 C)  TempSrc:  Oral  Oral  SpO2:   97% 98%  Weight:  63 kg    Height:  5' 4.5" (1.638 m)     General exam: Appears calm and comfortable  Respiratory system: Clear to auscultation. Respiratory effort normal. Cardiovascular system: S1 & S2 heard, RRR. No JVD, murmurs, rubs, gallops or clicks. No pedal edema. Gastrointestinal system: Abdomen is nondistended, soft and nontender. No organomegaly or masses felt. Normal bowel sounds heard. Central nervous system: Alert and oriented. No focal neurological deficits. Extremities: Symmetric 5 x 5 power. Skin: No rashes, lesions or ulcers Psychiatry: Judgement and insight appear normal. Mood & affect appropriate.    Data Reviewed:  CT scan results reviewed, lab results reviewed.  Family Communication: None  Disposition: Status is: Inpatient Remains inpatient appropriate because: Severity of disease, IV treatment, inpatient procedure.     Time spent: 35 minutes  Author: Marrion Coy, MD 01/15/2024 11:42 AM  For on call review [email or web address].

## 2024-01-15 NOTE — Consult Note (Signed)
 Urology Consult  I have been asked to see the patient by Dr. Valente David, for evaluation and management of right obstructive uropathy.  Chief Complaint: Right flank pain  History of Present Illness: Belinda Oneill is a 63 y.o. year old female with a history of nephrolithiasis who presented to the emergency department on January 14, 2024 for continued right-sided flank pain.  Right flank pain is caused by obstructing right proximal ureteral stones causing severe right hydronephrosis which had been diagnosed when she visited the emergency department on March 10.   At her visit on March 10, CT renal stone study noted multiple large stones in the right renal pelvis extending into the proximal right ureter with the largest stone measuring approximately 2.7 cm in length associated with moderate to severe right hydronephrosis.  She was deemed stable and satisfactory condition and discharged home and she was sent home on tamsulosin, acetaminophen and sulfate ferrous and instructed to follow-up with her PCP and nephrology.  She returned to the emergency department yesterday for continued and worsening pain and low-grade fevers with a max of 100.1.  She also had a decreased appetite.  Her WBC was 12.4, her hemoglobin is 8.6 which is down from 10 days ago and it was 10.6.  She denies any gross hematuria.  Her serum creatinine was 0.79.  Her urinalysis with leukocytes and a few bacteria, urine culture is pending.  She was seen in the emergency department in August 2023 and diagnosed with right hydroureteronephrosis with a stone at the right UVJ, she states that she had passed that.  She has been treated with ESWL through [facility], but I cannot find records indicating that.  VSS afebrile  She is currently having a 9 out of 10 right-sided flank pain.  The pain is so severe, she does not permit examination.  She states she is nauseous, but has not vomited.   Patient denies any modifying or  aggravating factors.  Patient denies any recent UTI's, gross hematuria or dysuria.  Patient denies any chills or vomiting.    Past Medical History:  Diagnosis Date   Essential hypertension 01/14/2024   Kidney stones    Kidney stones     Past Surgical History:  Procedure Laterality Date   LITHOTRIPSY      Home Medications:  No current facility-administered medications on file prior to encounter.   Current Outpatient Medications on File Prior to Encounter  Medication Sig Dispense Refill   acetaminophen (TYLENOL) 500 MG tablet Take 2 tablets (1,000 mg total) by mouth every 6 (six) hours as needed. 100 tablet 2   famotidine (PEPCID) 20 MG tablet Take 1 tablet (20 mg total) by mouth 2 (two) times daily. 120 tablet 0   ferrous sulfate 325 (65 FE) MG EC tablet Take 1 tablet (325 mg total) by mouth daily with breakfast. 60 tablet 0   furosemide (LASIX) 20 MG tablet Take 1 tablet (20 mg total) by mouth daily. 7 tablet 0   pantoprazole (PROTONIX) 40 MG tablet Take 1 tablet (40 mg total) by mouth daily. 30 tablet 1   tamsulosin (FLOMAX) 0.4 MG CAPS capsule Take 1 capsule (0.4 mg total) by mouth daily for 14 days. 14 capsule 0     Allergies: No Known Allergies  Family History  Problem Relation Age of Onset   Breast cancer Maternal Aunt     Social History:  reports that she has been smoking cigarettes. She has a 10 pack-year smoking history. She  has never used smokeless tobacco. She reports current alcohol use. She reports that she does not use drugs.  ROS: A complete review of systems was performed.  All systems are negative except for pertinent findings as noted.  Physical Exam:  Vital signs in last 24 hours: Temp:  [98.9 F (37.2 C)-100.1 F (37.8 C)] 99 F (37.2 C) (03/20 0349) Pulse Rate:  [95-122] 99 (03/20 0349) Resp:  [16-18] 16 (03/20 0349) BP: (100-177)/(63-76) 118/63 (03/20 0349) SpO2:  [95 %-100 %] 97 % (03/20 0349) Weight:  [63 kg] 63 kg (03/20 0006) Constitutional:   Alert and oriented, having a difficult time getting comfortable and writhing from pain. HEENT: [place] AT, moist mucus membranes.  Missing teeth.  Trachea midline Cardiovascular: Regular rate and rhythm, no clubbing, cyanosis, or edema. Respiratory: Normal respiratory effort GI: Abdomen is tender GU: Right CVA tenderness Skin: No rashes, bruises or suspicious lesions Neurologic: Grossly intact, no focal deficits, moving all 4 extremities Psychiatric: Normal mood and affect   Laboratory Data:  Recent Labs    01/14/24 1549 01/15/24 0544  WBC 11.9* 12.4*  HGB 10.3* 8.6*  HCT 31.1* 25.6*   Recent Labs    01/14/24 1549 01/15/24 0544  NA 130* 136  K 4.3 3.9  CL 96* 104  CO2 26 22  GLUCOSE 130* 110*  BUN 11 7*  CREATININE 0.85 0.79  CALCIUM 9.6 8.6*   No results for input(s): "LABPT", "INR" in the last 72 hours. No results for input(s): "LABURIN" in the last 72 hours. Results for orders placed or performed in visit on 05/20/19  Novel Coronavirus, NAA ([facility])     Status: None   Collection Time: 05/20/19  9:01 AM  Result Value Ref Range Status   SARS-CoV-2, NAA Not Detected Not Detected Final    Comment: Testing was performed using the cobas(R) SARS-CoV-2 test. This test was developed and its performance characteristics determined by [employer]. This test has not been FDA cleared or approved. This test has been authorized by FDA under an Emergency Use Authorization (EUA). This test is only authorized for the duration of time the declaration that circumstances exist justifying the authorization of the emergency use of in vitro diagnostic tests for detection of SARS-CoV-2 virus and/or diagnosis of COVID-19 infection under section 564(b)(1) of the Act, 21 U.S.C. 045WUJ-8(J)(1), unless the authorization is terminated or revoked sooner. When diagnostic testing is negative, the possibility of a false negative result should be considered in the context of a patient's recent  exposures and the presence of clinical signs and symptoms consistent with COVID-19. An individual without symptoms of COVID-19 and who is not shedding SARS-CoV-2 virus would expect to have a negati ve (not detected) result in this assay.      Radiologic Imaging: DG Chest 2 View Result Date: 01/14/2024 CLINICAL DATA:  Upper abdominal pain. EXAM: CHEST - 2 VIEW COMPARISON:  Chest radiograph 05/21/2022. Included lung bases from abdominopelvic CT 01/05/2024 FINDINGS: The cardiomediastinal contours are normal. The lungs are clear. Pulmonary vasculature is normal. No consolidation, pleural effusion, or pneumothorax. No acute osseous abnormalities are seen. Left breast calcification. IMPRESSION: No active cardiopulmonary disease. Electronically Signed   By: Narda Rutherford M.D.   On: 01/14/2024 17:23    Impression/Assessment:  63 year old female with a history of nephrolithiasis was found to have a multiple large stones in her right renal pelvis extending into the proximal ureter with severe right hydronephrosis mended for right obstructive uropathy resulting in intractable pain and possibly early pyelonephritis.  -  per patient has had ESWL in the past  -serum creatinine remained stable at 0.79 with a increase of WBCs to 12.4.  Plan:  -Keep NPO -Continue broad-spectrum antibiotic as per primary team until urine culture results are available then narrow spectrum -Discussed with patient ureteral stent placement vs nephrostomy tube placement and she would prefer nephrostomy tube placement  -IR contacted -PT/INR, PTT, bleeding time ordered  -Plans for right nephrostomy tube placement either this afternoon or tomorrow morning  01/15/2024, 7:30 AM  Claxton Levitz, PA-C

## 2024-01-15 NOTE — Plan of Care (Signed)

## 2024-01-15 NOTE — Consult Note (Signed)
 Chief Complaint: Right nephrolithiasis; right hydronephrosis - image guided right percutaneous nephrostomy placement   Referring Provider(s): Belinda Oneill   Supervising Physician: Belinda Oneill  Patient Status: [facility] - In-pt  History of Present Illness: Belinda Oneill is a 63 y.o. female with history of hypertension and kidney stones.   Pt initially presented to the emrgency department 01/05/24 with right sided flank pain.  A CT A/P was performed revealing right hydronephrosis - pt was discharged home with pain management instructions as well as a follow up with urology.  Pt returned to the emergency department 01/14/24 with continued and worsening right sided flank pain as well as low grade fevers and decreased appetite.  Pt was started on IV antibiotics for possible infection and admitted to the hospital.  Interventional radiology was consulted for possible image guided right percutaneous nephrostomy placement.  Imaging was reviewed and approved by Belinda Oneill 01/15/24 and scheduled for image guided right percutaneous nephrostomy placement.     Patient is Full Code  Past Medical History:  Diagnosis Date   Essential hypertension 01/14/2024   Kidney stones    Kidney stones     Past Surgical History:  Procedure Laterality Date   LITHOTRIPSY      Allergies: Patient has no known allergies.  Medications: Prior to Admission medications   Medication Sig Start Date End Date Taking? Authorizing Provider  acetaminophen (TYLENOL) 500 MG tablet Take 2 tablets (1,000 mg total) by mouth every 6 (six) hours as needed. 01/05/24 01/04/25 Yes Belinda Oneill  famotidine (PEPCID) 20 MG tablet Take 1 tablet (20 mg total) by mouth 2 (two) times daily. 04/02/20 06/01/20  Belinda Oneill  ferrous sulfate 325 (65 FE) MG EC tablet Take 1 tablet (325 mg total) by mouth daily with breakfast. 01/05/24   Belinda Oneill  furosemide (LASIX) 20 MG tablet Take 1 tablet (20 mg total) by  mouth daily. 05/30/17 05/30/18  Belinda Oneill  pantoprazole (PROTONIX) 40 MG tablet Take 1 tablet (40 mg total) by mouth daily. 08/09/19 08/08/20  Belinda Oneill  tamsulosin (FLOMAX) 0.4 MG CAPS capsule Take 1 capsule (0.4 mg total) by mouth daily for 14 days. 01/05/24 01/19/24  Belinda Oneill     Family History  Problem Relation Age of Onset   Breast cancer Maternal Aunt     Social History   Socioeconomic History   Marital status: Single    Spouse name: Not on file   Number of children: Not on file   Years of education: Not on file   Highest education level: Not on file  Occupational History   Not on file  Tobacco Use   Smoking status: Every Day    Current packs/day: 0.25    Average packs/day: 0.3 packs/day for 40.0 years (10.0 ttl pk-yrs)    Types: Cigarettes   Smokeless tobacco: Never  Substance and Sexual Activity   Alcohol use: Yes    Comment: occ   Drug use: No   Sexual activity: Not on file  Other Topics Concern   Not on file  Social History Narrative   Not on file   Social Drivers of Health   Financial Resource Strain: Not on file  Food Insecurity: No Food Insecurity (01/15/2024)   Hunger Vital Sign    Worried About Running Out of Food in the Last Year: Never true    Ran Out of Food in the Last Year: Never true  Transportation Needs: No Transportation Needs (  01/15/2024)   PRAPARE - Administrator, Civil Service (Medical): No    [place] of Transportation (Non-Medical): No  Physical Activity: Not on file  Stress: Not on file  Social Connections: Unknown (01/14/2024)   Social Connection and Isolation Panel [NHANES]    Frequency of Communication with Friends and Family: More than three times a week    Frequency of Social Gatherings with Friends and Family: More than three times a week    Attends Religious Services: More than 4 times per year    Active Member of [employer] or Organizations: No    Attends Banker Meetings:  Never    Marital Status: Not on file     Review of Systems: A 12 point ROS discussed and pertinent positives are indicated in the HPI above.  All other systems are negative.  Review of Systems  Constitutional:  Negative for chills, fatigue and fever.  Respiratory:  Negative for cough, shortness of breath and wheezing.   Gastrointestinal:  Negative for diarrhea, nausea and vomiting.  Neurological:  Negative for dizziness and headaches.  Psychiatric/Behavioral:  Negative for agitation, behavioral problems and confusion.     Vital Signs: BP 103/66 (BP Location: Right Arm)   Pulse 94   Temp 98.5 F (36.9 C) (Oral)   Resp 16   Ht 5' 4.5" (1.638 m)   Wt 138 lb 14.2 oz (63 kg)   SpO2 98%   BMI 23.47 kg/m   Advance Care Plan: The advanced care place/surrogate decision maker was discussed at the time of visit and the patient did not wish to discuss or was not able to name a surrogate decision maker or provide an advance care plan.  Physical Exam Constitutional:      Appearance: She is well-developed.  HENT:     Head: Atraumatic.     Mouth/Throat:     Mouth: Mucous membranes are moist.  Cardiovascular:     Rate and Rhythm: Normal rate and regular rhythm.     Heart sounds: No murmur heard. Pulmonary:     Effort: Pulmonary effort is normal.     Breath sounds: Normal breath sounds.  Abdominal:     General: Bowel sounds are normal.     Palpations: Abdomen is soft.  Musculoskeletal:        General: Normal range of motion.  Skin:    General: Skin is warm.  Neurological:     Mental Status: She is alert and oriented to person, place, and time.  Psychiatric:        Mood and Affect: Mood normal.        Behavior: Behavior normal.     Imaging: DG Chest 2 View Result Date: 01/14/2024 CLINICAL DATA:  Upper abdominal pain. EXAM: CHEST - 2 VIEW COMPARISON:  Chest radiograph 05/21/2022. Included lung bases from abdominopelvic CT 01/05/2024 FINDINGS: The cardiomediastinal contours are  normal. The lungs are clear. Pulmonary vasculature is normal. No consolidation, pleural effusion, or pneumothorax. No acute osseous abnormalities are seen. Left breast calcification. IMPRESSION: No active cardiopulmonary disease. Electronically Signed   By: Belinda Oneill M.D.   On: 01/14/2024 17:23   CT ABDOMEN PELVIS WO CONTRAST Result Date: 01/05/2024 CLINICAL DATA:  Abdominal/flank pain. EXAM: CT ABDOMEN AND PELVIS WITHOUT CONTRAST TECHNIQUE: Multidetector CT imaging of the abdomen and pelvis was performed following the standard protocol without IV contrast. RADIATION DOSE REDUCTION: This exam was performed according to the departmental dose-optimization program which includes automated exposure control, adjustment of the mA  and/or kV according to patient size and/or use of iterative reconstruction technique. COMPARISON:  CT abdomen pelvis dated 06/07/2022. FINDINGS: Evaluation of this exam is limited in the absence of intravenous contrast. Lower chest: The visualized lung bases are clear. No intra-abdominal free air or free fluid. Hepatobiliary: The liver is unremarkable. No biliary dilatation. The gallbladder is unremarkable. Pancreas: Unremarkable. No pancreatic ductal dilatation or surrounding inflammatory changes. Spleen: Normal in size without focal abnormality. Adrenals/Urinary Tract: The adrenal glands are unremarkable. Multiple large stones in the right renal pelvis extending into the proximal right ureter. The largest stone measures approximately 2.7 cm in length. There is moderate to severe right hydronephrosis. Additional stones noted in the upper pole collecting system. Multiple nonobstructing left renal calculi measure up to 9 mm in the upper pole. No hydronephrosis on the left. The urinary bladder is unremarkable. Stomach/Bowel: Several scattered colonic diverticula. There is no bowel obstruction or active inflammation. The appendix is normal. Vascular/Lymphatic: Moderate aortoiliac  atherosclerotic disease. The IVC is unremarkable. No portal venous gas. There is no adenopathy. Reproductive: The uterus is grossly unremarkable. No suspicious adnexal masses. Other: None Musculoskeletal: No acute osseous pathology. IMPRESSION: 1. Multiple large stones in the right renal pelvis extending into the proximal right ureter with moderate to severe right hydronephrosis. 2. Multiple nonobstructing left renal calculi. No hydronephrosis on the left. 3. No bowel obstruction. Normal appendix. Electronically Signed   By: Elgie Collard M.D.   On: 01/05/2024 19:48    Labs:  CBC: Recent Labs    01/05/24 1608 01/14/24 1549 01/15/24 0544  WBC 9.2 11.9* 12.4*  HGB 10.6* 10.3* 8.6*  HCT 31.2* 31.1* 25.6*  PLT 478* 644* 566*    COAGS: No results for input(s): "INR", "APTT" in the last 8760 hours.  BMP: Recent Labs    01/05/24 1608 01/14/24 1549 01/15/24 0544  NA 133* 130* 136  K 3.7 4.3 3.9  CL 101 96* 104  CO2 21* 26 22  GLUCOSE 114* 130* 110*  BUN 10 11 7*  CALCIUM 9.1 9.6 8.6*  CREATININE 0.85 0.85 0.79  GFRNONAA >60 >60 >60    LIVER FUNCTION TESTS: Recent Labs    01/05/24 1608 01/14/24 1549  BILITOT 0.6 0.6  AST 23 22  ALT 27 27  ALKPHOS 75 93  PROT 8.1 8.5*  ALBUMIN 3.1* 2.7*    TUMOR MARKERS: No results for input(s): "AFPTM", "CEA", "CA199", "CHROMGRNA" in the last 8760 hours.  Assessment and Plan:  Pt with right nephrolithiasis and right hydronephrosis scheduled for image guided right percutaneous nephrostomy placement 01/15/24.  Imaging reviewed and approved by Belinda Oneill 01/15/24.    Risks and benefits of right PCN placement was discussed with the patient including, but not limited to, infection, bleeding, significant bleeding causing loss or decrease in renal function or damage to adjacent structures.   All of the patient's questions were answered, patient is agreeable to proceed.  Consent signed and in chart. Copy of consent made and given to pt  per her request.    Thank you for allowing our service to participate in Belinda Oneill 's care.  Electronically Signed: Loman Brooklyn, Oneill   01/15/2024, 9:02 AM    I spent a total of 55 Miinutes    in face to face in clinical consultation, greater than 50% of which was counseling/coordinating care for imae guided right percutaneous nephrostomy placement.

## 2024-01-15 NOTE — Procedures (Signed)
 Interventional Radiology Procedure Note  Procedure: Right percutaneous nephrostomy tube placement  Complications: None  Estimated Blood Loss: < 10 mL  Findings: Massive right hydronephrosis. Grossly purulent fluid in collecting system. Sample sent for culture. 10 Fr PCN placed and formed in renal pelvis. Attached to gravity bag.  Jodi Marble. Fredia Sorrow, M.D Pager:  [phone]

## 2024-01-15 NOTE — Hospital Course (Addendum)
 Belinda Oneill is a 63 y.o. African-American female with medical history significant for urolithiasis, who presented to the ER with acute onset of right flank pain which has been intermittent over the last 2 to 3 weeks, with associated urinary frequency and urgency as well as tactile fever and chills.  Tmax at home was 100.1.  She had a CT scan on 3/10, showed multiple large stones in the right renal pelvis extending into the proximal right ureter with a moderate to severe right hydronephrosis.  He came back to the emergency room with worsening back pain and a fever.  He was diagnosed with acute pyelonephritis, was started on IV antibiotics.  Urology consult obtained, patient is going to have a urostomy tube placed. Patient has improved, urine culture finalized.  Will continue oral antibiotic completed 2 weeks course.  Follow-up with urology in 2 weeks.

## 2024-01-16 ENCOUNTER — Telehealth: Payer: Self-pay | Admitting: Urology

## 2024-01-16 LAB — URINE CULTURE: Culture: >=100000 — AB

## 2024-01-16 LAB — IRON AND TIBC
Iron: 10 ug/dL — ABNORMAL LOW (ref 28–170)
Saturation Ratios: 7 % — ABNORMAL LOW (ref 10.4–31.8)
TIBC: 139 ug/dL — ABNORMAL LOW (ref 250–450)
UIBC: 129 ug/dL

## 2024-01-16 LAB — BASIC METABOLIC PANEL
Anion gap: 7 (ref 5–15)
BUN: 6 mg/dL — ABNORMAL LOW (ref 8–23)
CO2: 23 mmol/L (ref 22–32)
Calcium: 8.4 mg/dL — ABNORMAL LOW (ref 8.9–10.3)
Chloride: 103 mmol/L (ref 98–111)
Creatinine, Ser: 0.82 mg/dL (ref 0.44–1.00)
GFR, Estimated: >60 mL/min (ref >60)
Glucose, Bld: 108 mg/dL — ABNORMAL HIGH (ref 70–99)
Potassium: 4 mmol/L (ref 3.5–5.1)
Sodium: 133 mmol/L — ABNORMAL LOW (ref 135–145)

## 2024-01-16 LAB — CBC
HCT: 24.5 % — ABNORMAL LOW (ref 36.0–46.0)
Hemoglobin: 7.9 g/dL — ABNORMAL LOW (ref 12.0–15.0)
MCH: 28.4 pg (ref 26.0–34.0)
MCHC: 32.2 g/dL (ref 30.0–36.0)
MCV: 88.1 fL (ref 80.0–100.0)
Platelets: 533 10*3/uL — ABNORMAL HIGH (ref 150–400)
RBC: 2.78 MIL/uL — ABNORMAL LOW (ref 3.87–5.11)
RDW: 13.7 % (ref 11.5–15.5)
WBC: 11.7 10*3/uL — ABNORMAL HIGH (ref 4.0–10.5)
nRBC: 0 % (ref 0.0–0.2)

## 2024-01-16 LAB — MAGNESIUM: Magnesium: 1.9 mg/dL (ref 1.7–2.4)

## 2024-01-16 LAB — FERRITIN: Ferritin: 336 ng/mL — ABNORMAL HIGH (ref 11–307)

## 2024-01-16 LAB — VITAMIN B12: Vitamin B-12: 554 pg/mL (ref 180–914)

## 2024-01-16 MED ORDER — SENNOSIDES-DOCUSATE SODIUM 8.6-50 MG PO TABS
2.0000 | ORAL_TABLET | Freq: Two times a day (BID) | ORAL | Status: DC
  Administered 2024-01-16 – 2024-01-20 (×8): 2 via ORAL
  Filled 2024-01-16 (×9): qty 2

## 2024-01-16 MED ORDER — LACTULOSE 10 GM/15ML PO SOLN
20.0000 g | Freq: Once | ORAL | Status: AC
  Administered 2024-01-16: 20 g via ORAL
  Filled 2024-01-16: qty 30

## 2024-01-16 NOTE — Telephone Encounter (Signed)
 Belinda Oneill is currently admitted, but she may go home over the weekend.  She will need a follow up with Dr. Apolinar Junes to discuss further management of her renal function and stone treatment in two weeks.

## 2024-01-16 NOTE — Telephone Encounter (Signed)
 Appointment made for first available

## 2024-01-16 NOTE — Progress Notes (Addendum)
 Urology Consult Follow Up  Subjective: Patient underwent right nephrostomy tube placement yesterday with IR.  Drainage has a chocolate milk appearance.  She continues to have right-sided tenderness.  She states her voided urine has a weak tea appearance.   Patient denies any modifying or aggravating factors.  Patient denies any dysuria or suprapubic pain.  Patient denies any fevers, chills, nausea or vomiting.    VSS afebrile   WBC count down to 1.7 from 12.4 yesterday.  Hemoglobin is down to 7.9 from 8.6 yesterday, but most likely dilutional.  Serum creatinine at her baseline is 0.82.  Cultures from nephrostomy tube drainage still pending.  Anti-infectives: Anti-infectives (From admission, onward)    Start     Dose/Rate Route Frequency Ordered Stop   01/15/24 1800  cefTRIAXone (ROCEPHIN) 2 g in sodium chloride 0.9 % 100 mL IVPB  Status:  Discontinued        2 g 200 mL/hr over 30 Minutes Intravenous Every 24 hours 01/14/24 2131 01/15/24 1422   01/15/24 1430  cefTRIAXone (ROCEPHIN) 2 g in sodium chloride 0.9 % 100 mL IVPB        2 g 200 mL/hr over 30 Minutes Intravenous Every 24 hours 01/15/24 1422     01/14/24 1730  cefTRIAXone (ROCEPHIN) 1 g in sodium chloride 0.9 % 100 mL IVPB        1 g 200 mL/hr over 30 Minutes Intravenous  Once 01/14/24 1722 01/14/24 1910       Current Facility-Administered Medications  Medication Dose Route Frequency Provider Last Rate Last Admin   acetaminophen (TYLENOL) tablet 650 mg  650 mg Oral Q6H PRN Mansy, Jan A, MD   650 mg at 01/15/24 1534   Or   acetaminophen (TYLENOL) suppository 650 mg  650 mg Rectal Q6H PRN Mansy, Jan A, MD       cefTRIAXone (ROCEPHIN) 2 g in sodium chloride 0.9 % 100 mL IVPB  2 g Intravenous Q24H Marrion Coy, MD 200 mL/hr at 01/15/24 1500 2 g at 01/15/24 1500   enoxaparin (LOVENOX) injection 40 mg  40 mg Subcutaneous Q24H Mansy, Jan A, MD   40 mg at 01/15/24 2006   famotidine (PEPCID) tablet 20 mg  20 mg Oral BID Mansy, Jan A, MD    20 mg at 01/15/24 2005   ferrous sulfate tablet 325 mg  325 mg Oral Q breakfast Mansy, Jan A, MD   325 mg at 01/15/24 0848   HYDROmorphone (DILAUDID) injection 0.5 mg  0.5 mg Intravenous Q4H PRN Marrion Coy, MD   0.5 mg at 01/15/24 2005   magnesium hydroxide (MILK OF MAGNESIA) suspension 30 mL  30 mL Oral Daily PRN Mansy, Jan A, MD       ondansetron [facility]) tablet 4 mg  4 mg Oral Q6H PRN Mansy, Jan A, MD       Or   ondansetron [facility]) injection 4 mg  4 mg Intravenous Q6H PRN Mansy, Jan A, MD       oxyCODONE-acetaminophen (PERCOCET/ROXICET) 5-325 MG per tablet 1 tablet  1 tablet Oral Q4H PRN Marrion Coy, MD   1 tablet at 01/15/24 2316   pantoprazole (PROTONIX) EC tablet 40 mg  40 mg Oral Daily Mansy, Jan A, MD   40 mg at 01/15/24 0849   sodium chloride flush (NS) 0.9 % injection 5 mL  5 mL Intracatheter Q8H Irish Lack, MD   5 mL at 01/15/24 1613   tamsulosin (FLOMAX) capsule 0.4 mg  0.4 mg Oral Daily Mansy,  Jan A, MD   0.4 mg at 01/15/24 0849   traZODone (DESYREL) tablet 25 mg  25 mg Oral QHS PRN Mansy, Jan A, MD   25 mg at 01/15/24 2318     Objective: Vital signs in last 24 hours: Temp:  [98.2 F (36.8 C)-100 F (37.8 C)] 98.2 F (36.8 C) (03/21 0341) Pulse Rate:  [94-122] 105 (03/21 0341) Resp:  [11-24] 18 (03/21 0341) BP: (87-126)/(55-86) 116/72 (03/21 0341) SpO2:  [93 %-100 %] 94 % (03/21 0341)  Intake/Output from previous day: 03/20 0701 - 03/21 0700 In: 1632.7 [I.V.:1532.7; IV Piggyback:100] Out: -  Intake/Output this shift: No intake/output data recorded.   Physical Exam Vitals and nursing note reviewed.  Constitutional:      General: She is not in acute distress.    Appearance: Normal appearance. She is not ill-appearing, toxic-appearing or diaphoretic.  HENT:     Head: Normocephalic and atraumatic.     Right Ear: External ear normal.     Left Ear: External ear normal.     Nose: Nose normal.     Mouth/Throat:     Mouth: Mucous membranes are dry.      Pharynx: Oropharynx is clear.  Eyes:     Extraocular Movements: Extraocular movements intact.     Conjunctiva/sclera: Conjunctivae normal.     Pupils: Pupils are equal, round, and reactive to light.  Pulmonary:     Effort: Pulmonary effort is normal. No respiratory distress.  Abdominal:     General: Abdomen is flat.     Palpations: Abdomen is soft.  Genitourinary:    Comments: Right nephrostomy tube in place.  Dressing is clean and dry.  Drainage thick and purulent, like chocolate milk. Musculoskeletal:        General: Normal range of motion.     Cervical back: Normal range of motion.     Right lower leg: No edema.     Left lower leg: No edema.  Skin:    General: Skin is warm and dry.  Neurological:     General: No focal deficit present.     Mental Status: She is alert and oriented to person, place, and time.  Psychiatric:        Mood and Affect: Mood normal.        Behavior: Behavior normal.        Thought Content: Thought content normal.        Judgment: Judgment normal.     Lab Results:  Recent Labs    01/15/24 0544 01/16/24 0501  WBC 12.4* 11.7*  HGB 8.6* 7.9*  HCT 25.6* 24.5*  PLT 566* 533*   BMET Recent Labs    01/15/24 0544 01/16/24 0501  NA 136 133*  K 3.9 4.0  CL 104 103  CO2 22 23  GLUCOSE 110* 108*  BUN 7* 6*  CREATININE 0.79 0.82  CALCIUM 8.6* 8.4*   PT/INR Recent Labs    01/15/24 0843  LABPROT 16.2*  INR 1.3*   ABG No results for input(s): "PHART", "HCO3" in the last 72 hours.  Invalid input(s): "PCO2", "PO2"  Studies/Results: IR NEPHROSTOMY PLACEMENT RIGHT Result Date: 01/15/2024 CLINICAL DATA:  Obstructed right kidney secondary to central and proximal ureteral calculi. Suspected chronic obstruction with infection. EXAM: 1. ULTRASOUND GUIDANCE FOR PUNCTURE OF THE RIGHT RENAL COLLECTING SYSTEM. 2. RIGHT PERCUTANEOUS NEPHROSTOMY TUBE PLACEMENT. COMPARISON:  None Available. ANESTHESIA/SEDATION: Moderate (conscious) sedation was employed  during this procedure. A total of Versed 2.5 mg and Fentanyl 125 mcg was  administered intravenously. Moderate Sedation Time: 25 minutes. The patient's level of consciousness and vital signs were monitored continuously by radiology nursing throughout the procedure under my direct supervision. CONTRAST:  12 mL Omnipaque 300 MEDICATIONS: A scheduled dose of 2 g of IV Rocephin was given just prior to the procedure. FLUOROSCOPY TIME:  50 seconds.  4.0 mGy. PROCEDURE: The procedure, risks, benefits, and alternatives were explained to the patient. Questions regarding the procedure were encouraged and answered. The patient understands and consents to the procedure. A time-out was performed prior to initiating the procedure. The left flank region was prepped with chlorhexidine in a sterile fashion, and a sterile drape was applied covering the operative field. A sterile gown and sterile gloves were used for the procedure. Local anesthesia was provided with 1% Lidocaine. Ultrasound was used to localize the right kidney. Under ultrasound image was saved and recorded. Under direct ultrasound guidance, an 18 gauge trocar needle was advanced into the right renal collecting system. Ultrasound image documentation was performed. Aspiration of urine sample was performed followed by contrast injection. Percutaneous tract dilatation was then performed over the guidewire. A 10-French percutaneous nephrostomy tube was then advanced and formed in the collecting system. Catheter position was confirmed by fluoroscopy after contrast injection. The catheter was secured at the skin with a Prolene retention suture and Stat-Lock device. The nephrostomy tube was attached to a gravity bag. COMPLICATIONS: None. FINDINGS: Ultrasound demonstrates massive hydronephrosis of the right kidney with cortical thinning. Aspiration at the level of the collecting system demonstrates grossly purulent urine return. Contrast injection demonstrates a partially  duplicated collecting system. The nephrostomy tube was formed at the level of the renal pelvis. There is return of purulent urine after tube placement. IMPRESSION: Right percutaneous nephrostomy tube placement. Grossly purulent urine return noted with a sample sent for culture analysis. A 10 French nephrostomy tube was placed and formed in the renal pelvis. This will be kept to gravity bag drainage. Electronically Signed   By: Irish Lack M.D.   On: 01/15/2024 15:51   DG Chest 2 View Result Date: 01/14/2024 CLINICAL DATA:  Upper abdominal pain. EXAM: CHEST - 2 VIEW COMPARISON:  Chest radiograph 05/21/2022. Included lung bases from abdominopelvic CT 01/05/2024 FINDINGS: The cardiomediastinal contours are normal. The lungs are clear. Pulmonary vasculature is normal. No consolidation, pleural effusion, or pneumothorax. No acute osseous abnormalities are seen. Left breast calcification. IMPRESSION: No active cardiopulmonary disease. Electronically Signed   By: Narda Rutherford M.D.   On: 01/14/2024 17:23     Assessment and Plan: 63 year old female with history of nephrolithiasis who presented to the ED and was found to have severe hydronephrosis secondary to numerous large stones in her proximal ureter and right renal pelvis who underwent right nephrostomy tube placement with return of purulent urine yesterday.  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  Serum creatinine has remained stable throughout admission and procedure.  WBC count demonstrated mild leukocytosis.  Plans: -Continue broad-spectrum antibiotics until cultures are available the narrow spectrum, will need to be on 10 days of culture appropriate antibiotics -will need a follow up in two weeks with Dr. Apolinar Junes, I discussed with the patient that she will be discharged with a nephrostomy tube in place and at the follow-up appointment with Dr. Apolinar Junes, we will be arranging renal studies to evaluate the function of her right kidney in order to help  [place] to plan our next steps in her management.  I did advise her that if the renal function tests demonstrate  that the right kidney is no longer functioning, we may want to consider removal of that right kidney and we will discuss this further at her follow-up.  If there appears to be good function in the right kidney, we will discuss definitive management for her stones. -we will arrange follow up    LOS: 2 days    [facility] 01/16/2024

## 2024-01-16 NOTE — Progress Notes (Signed)
  Progress Note   Patient: Belinda Oneill:[id number] DOB: 1961/02/24 DOA: 01/14/2024     2 DOS: the patient was seen and examined on 01/16/2024   Brief hospital course: Belinda Oneill is a 63 y.o. African-American female with medical history significant for urolithiasis, who presented to the ER with acute onset of right flank pain which has been intermittent over the last 2 to 3 weeks, with associated urinary frequency and urgency as well as tactile fever and chills.  Tmax at home was 100.1.  She had a CT scan on 3/10, showed multiple large stones in the right renal pelvis extending into the proximal right ureter with a moderate to severe right hydronephrosis.  He came back to the emergency room with worsening back pain and a fever.  He was diagnosed with acute pyelonephritis, was started on IV antibiotics.  Urology consult obtained, patient is going to have a urostomy tube placed.   Principal Problem:   Acute unilateral obstructive uropathy Active Problems:   Elevated blood pressure reading   Acute lower UTI   Tobacco abuse   GERD without esophagitis   Acute pyelonephritis   Reactive thrombocytosis   Assessment and Plan:   Acute unilateral obstructive uropathy Acute pyelonephritis secondary to Proteus mirabilis. Patient did not meet sepsis criteria at the time of admission, sepsis ruled out. Urine culture sent out, pending results.  Blood culture was not sent. Continue antibiotics Patient is scheduled to have left nephrostomy placed today due to multiple large stones. Patient still has significant purulent urine from nephrostomy tube, culture from urine grew Proteus mirabilis, final susceptibilities still pending.  Continue Rocephin.   Hyponatremia. Sodium level is better after giving fluids.   Anemia of chronic disease. Reactive thrombocytosis. Patient has a low iron, elevated ferritin consistent with anemia of chronic disease, B12 level pending.  Continue to follow  hemoglobin, transfuse less than 7.   Tobacco abuse Smoking cessation advised.   GERD without esophagitis - We will continue PPI therapy and H2 blocker therapy..       Subjective:  Patient feels much better today, still has some flank pain.  No additional fever or chills.  Physical Exam: Vitals:   01/15/24 1555 01/15/24 2023 01/16/24 0341 01/16/24 0929  BP: 109/60 (!) 126/58 116/72 116/70  Pulse: (!) 105 100 (!) 105 (!) 102  Resp: 14 16 18 16   Temp:  99.1 F (37.3 C) 98.2 F (36.8 C) 98.9 F (37.2 C)  TempSrc:   Oral Oral  SpO2: 93% 100% 94% 97%  Weight:      Height:       General exam: Appears calm and comfortable  Respiratory system: Clear to auscultation. Respiratory effort normal. Cardiovascular system: S1 & S2 heard, RRR. No JVD, murmurs, rubs, gallops or clicks. No pedal edema. Gastrointestinal system: Abdomen is nondistended, soft and nontender. No organomegaly or masses felt. Normal bowel sounds heard. Central nervous system: Alert and oriented. No focal neurological deficits. Extremities: Symmetric 5 x 5 power. Skin: No rashes, lesions or ulcers Psychiatry: Judgement and insight appear normal. Mood & affect appropriate.    Data Reviewed:  Lab results reviewed.  Family Communication: None  Disposition: Status is: Inpatient Remains inpatient appropriate because: Severity of disease, IV treatment.     Time spent: 35 minutes  Author: Marrion Coy, MD 01/16/2024 1:21 PM  For on call review [email or web address].

## 2024-01-16 NOTE — Plan of Care (Signed)

## 2024-01-17 LAB — CBC
HCT: 23 % — ABNORMAL LOW (ref 36.0–46.0)
Hemoglobin: 7.7 g/dL — ABNORMAL LOW (ref 12.0–15.0)
MCH: 28.2 pg (ref 26.0–34.0)
MCHC: 33.5 g/dL (ref 30.0–36.0)
MCV: 84.2 fL (ref 80.0–100.0)
Platelets: 557 10*3/uL — ABNORMAL HIGH (ref 150–400)
RBC: 2.73 MIL/uL — ABNORMAL LOW (ref 3.87–5.11)
RDW: 13.7 % (ref 11.5–15.5)
WBC: 11.3 10*3/uL — ABNORMAL HIGH (ref 4.0–10.5)
nRBC: 0 % (ref 0.0–0.2)

## 2024-01-17 MED ORDER — POLYSACCHARIDE IRON COMPLEX 150 MG PO CAPS
150.0000 mg | ORAL_CAPSULE | Freq: Every day | ORAL | Status: DC
  Administered 2024-01-17 – 2024-01-20 (×4): 150 mg via ORAL
  Filled 2024-01-17 (×4): qty 1

## 2024-01-17 NOTE — Plan of Care (Signed)

## 2024-01-17 NOTE — Progress Notes (Signed)
 Progress Note   Patient: Belinda Oneill QMV:[id number] DOB: [birth year] DOA: 01/14/2024     3 DOS: the patient was seen and examined on 01/17/2024   Brief hospital course: Keita SHETARA LAUNER is a 63 y.o. African-American female with medical history significant for urolithiasis, who presented to the ER with acute onset of right flank pain which has been intermittent over the last 2 to 3 weeks, with associated urinary frequency and urgency as well as tactile fever and chills.  Tmax at home was 100.1.  She had a CT scan on 3/10, showed multiple large stones in the right renal pelvis extending into the proximal right ureter with a moderate to severe right hydronephrosis.  He came back to the emergency room with worsening back pain and a fever.  He was diagnosed with acute pyelonephritis, was started on IV antibiotics.  Urology consult obtained, patient is going to have a urostomy tube placed.   Principal Problem:   Acute unilateral obstructive uropathy Active Problems:   Elevated blood pressure reading   Acute lower UTI   Tobacco abuse   GERD without esophagitis   Acute pyelonephritis   Reactive thrombocytosis   Assessment and Plan:  Acute unilateral obstructive uropathy Acute pyelonephritis secondary to Proteus mirabilis. Patient did not meet sepsis criteria at the time of admission, sepsis ruled out. Urine culture sent out, pending results.  Blood culture was not sent. Continue antibiotics Patient is scheduled to have left nephrostomy placed today due to multiple large stones. Patient still has significant purulent urine from nephrostomy tube, culture from urine grew Proteus mirabilis, pansensitive, culture from nephrostomy tube still pending final susceptibility.  Assumed to be the same. Patient still has pain in the back, still have purulent drainage from nephrostomy tube.  I will keep her for another day with Rocephin.  Plan to change to oral antibiotic tomorrow for total course of 10 days  per recommendation from urology.  Hyponatremia. Sodium level stable.   Anemia of chronic disease. Reactive thrombocytosis. Patient does not have any active bleeding, hemoglobin dropped down to 7.7 today.  Patient has a low iron, elevation of ferritin.  12 level normal.  I will continue oral iron treatment, but changed to iron polysaccharide to increase absorption.  Recheck a CBC tomorrow.   Tobacco abuse Smoking cessation advised.   GERD without esophagitis - We will continue PPI therapy and H2 blocker therapy..       Subjective:  History of complaining of flank pain, no nausea vomiting or diarrhea.  Constipated.  Physical Exam: Vitals:   01/16/24 0929 01/16/24 1949 01/16/24 2322 01/17/24 0525  BP: 116/70 110/67  103/67  Pulse: (!) 102 100  (!) 101  Resp: 16 20  20   Temp: 98.9 F (37.2 C) 99.6 F (37.6 C) 99.2 F (37.3 C) 99.7 F (37.6 C)  TempSrc: Oral Oral Oral Oral  SpO2: 97% 96%  100%  Weight:      Height:       General exam: Appears calm and comfortable  Respiratory system: Clear to auscultation. Respiratory effort normal. Cardiovascular system: S1 & S2 heard, RRR. No JVD, murmurs, rubs, gallops or clicks. No pedal edema. Gastrointestinal system: Abdomen is nondistended, soft and nontender. No organomegaly or masses felt. Normal bowel sounds heard. Central nervous system: Alert and oriented. No focal neurological deficits. Extremities: Symmetric 5 x 5 power. Skin: No rashes, lesions or ulcers Psychiatry: Judgement and insight appear normal. Mood & affect appropriate.    Data Reviewed:  Lab results reviewed.  Family Communication: None  Disposition: Status is: Inpatient Remains inpatient appropriate because: Severity of disease, IV treatment.     Time spent: 35 minutes  Author: Marrion Coy, MD 01/17/2024 12:14 PM  For on call review [email or web address].

## 2024-01-18 LAB — CBC
HCT: 24.9 % — ABNORMAL LOW (ref 36.0–46.0)
Hemoglobin: 8.3 g/dL — ABNORMAL LOW (ref 12.0–15.0)
MCH: 28.4 pg (ref 26.0–34.0)
MCHC: 33.3 g/dL (ref 30.0–36.0)
MCV: 85.3 fL (ref 80.0–100.0)
Platelets: 593 10*3/uL — ABNORMAL HIGH (ref 150–400)
RBC: 2.92 MIL/uL — ABNORMAL LOW (ref 3.87–5.11)
RDW: 13.7 % (ref 11.5–15.5)
WBC: 12.6 10*3/uL — ABNORMAL HIGH (ref 4.0–10.5)
nRBC: 0 % (ref 0.0–0.2)

## 2024-01-18 LAB — URINE CULTURE: Culture: 60000 — AB

## 2024-01-18 MED ORDER — LACTULOSE 10 GM/15ML PO SOLN
20.0000 g | Freq: Once | ORAL | Status: AC
  Administered 2024-01-18: 20 g via ORAL
  Filled 2024-01-18: qty 30

## 2024-01-18 MED ORDER — OXYCODONE-ACETAMINOPHEN 5-325 MG PO TABS
1.0000 | ORAL_TABLET | ORAL | Status: DC | PRN
  Administered 2024-01-19 – 2024-01-20 (×7): 2 via ORAL
  Filled 2024-01-18 (×7): qty 2

## 2024-01-18 NOTE — Progress Notes (Signed)
 Progress Note   Patient: Belinda Oneill WGN:[id number] DOB: 20-Oct-1961 DOA: 01/14/2024     4 DOS: the patient was seen and examined on 01/18/2024   Brief hospital course: Belinda Oneill is a 63 y.o. African-American female with medical history significant for urolithiasis, who presented to the ER with acute onset of right flank pain which has been intermittent over the last 2 to 3 weeks, with associated urinary frequency and urgency as well as tactile fever and chills.  Tmax at home was 100.1.  She had a CT scan on 3/10, showed multiple large stones in the right renal pelvis extending into the proximal right ureter with a moderate to severe right hydronephrosis.  He came back to the emergency room with worsening back pain and a fever.  He was diagnosed with acute pyelonephritis, was started on IV antibiotics.  Urology consult obtained, patient is going to have a urostomy tube placed.   Principal Problem:   Acute unilateral obstructive uropathy Active Problems:   Elevated blood pressure reading   Acute lower UTI   Tobacco abuse   GERD without esophagitis   Acute pyelonephritis   Reactive thrombocytosis   Assessment and Plan: Acute unilateral obstructive uropathy Acute pyelonephritis secondary to Proteus mirabilis. Patient did not meet sepsis criteria at the time of admission, sepsis ruled out. Urine culture sent out, pending results.  Blood culture was not sent. Continue antibiotics Patient is scheduled to have left nephrostomy placed today due to multiple large stones. Patient still has significant purulent urine from nephrostomy tube, culture from urine grew Proteus mirabilis, pansensitive, culture from nephrostomy tube still pending final susceptibility.  Assumed to be the same. Patient still has pain in the back, still have purulent drainage from nephrostomy tube.   Patient does not feel ready to discharge.  I will continue Rocephin for another day.  Likely change to oral tomorrow.   Tentatively scheduled for discharge tomorrow.   Hyponatremia. Recheck level tomorrow.   Anemia of chronic disease. Reactive thrombocytosis. Patient does not have any active bleeding, hemoglobin dropped down to 7.7 today.  Patient has a low iron, elevation of ferritin.  12 level normal.  I will continue oral iron treatment, but changed to iron polysaccharide to increase absorption.  Continue to follow.   Tobacco abuse Smoking cessation advised.   GERD without esophagitis - We will continue PPI therapy and H2 blocker therapy..      Subjective: \ Still complaining of flank pain.  No nausea vomiting.  Tolerating diet. Last recorded bowel movement was 3/19, give a dose of lactulose.  Physical Exam: Vitals:   01/18/24 0621 01/18/24 0727 01/18/24 0759 01/18/24 0812  BP:   (!) 94/52   Pulse: 89  91   Resp:  18 16 16   Temp:   98.7 F (37.1 C)   TempSrc:      SpO2: 100%  95%   Weight:      Height:       General exam: Appears calm and comfortable  Respiratory system: Clear to auscultation. Respiratory effort normal. Cardiovascular system: S1 & S2 heard, RRR. No JVD, murmurs, rubs, gallops or clicks. No pedal edema. Gastrointestinal system: Abdomen is nondistended, soft and nontender. No organomegaly or masses felt. Normal bowel sounds heard. Central nervous system: Alert and oriented. No focal neurological deficits. Extremities: Symmetric 5 x 5 power. Skin: No rashes, lesions or ulcers Psychiatry: Judgement and insight appear normal. Mood & affect appropriate.    Data Reviewed:  Lab results reviewed.  Family Communication: None  Disposition: Status is: Inpatient Remains inpatient appropriate because: Severity of disease, IV treatment.     Time spent: 35 minutes  Author: Marrion Coy, MD 01/18/2024 11:03 AM  For on call review [email or web address].

## 2024-01-18 NOTE — Plan of Care (Signed)
  Problem: Education: Goal: Knowledge of General Education information will improve Description: Including pain rating scale, medication(s)/side effects and non-pharmacologic comfort measures Outcome: Progressing   Problem: Clinical Measurements: Goal: Ability to maintain clinical measurements within normal limits will improve Outcome: Progressing   Problem: Clinical Measurements: Goal: Respiratory complications will improve Outcome: Progressing   Problem: Nutrition: Goal: Adequate nutrition will be maintained Outcome: Progressing   Problem: Coping: Goal: Level of anxiety will decrease Outcome: Progressing

## 2024-01-19 ENCOUNTER — Other Ambulatory Visit: Payer: Self-pay

## 2024-01-19 LAB — CBC
HCT: 23.2 % — ABNORMAL LOW (ref 36.0–46.0)
Hemoglobin: 7.7 g/dL — ABNORMAL LOW (ref 12.0–15.0)
MCH: 27.8 pg (ref 26.0–34.0)
MCHC: 33.2 g/dL (ref 30.0–36.0)
MCV: 83.8 fL (ref 80.0–100.0)
Platelets: 576 10*3/uL — ABNORMAL HIGH (ref 150–400)
RBC: 2.77 MIL/uL — ABNORMAL LOW (ref 3.87–5.11)
RDW: 13.6 % (ref 11.5–15.5)
WBC: 11.3 10*3/uL — ABNORMAL HIGH (ref 4.0–10.5)
nRBC: 0 % (ref 0.0–0.2)

## 2024-01-19 LAB — MAGNESIUM: Magnesium: 2 mg/dL (ref 1.7–2.4)

## 2024-01-19 LAB — BASIC METABOLIC PANEL
Anion gap: 10 (ref 5–15)
BUN: 7 mg/dL — ABNORMAL LOW (ref 8–23)
CO2: 25 mmol/L (ref 22–32)
Calcium: 9 mg/dL (ref 8.9–10.3)
Chloride: 99 mmol/L (ref 98–111)
Creatinine, Ser: 0.78 mg/dL (ref 0.44–1.00)
GFR, Estimated: >60 mL/min (ref >60)
Glucose, Bld: 99 mg/dL (ref 70–99)
Potassium: 3.9 mmol/L (ref 3.5–5.1)
Sodium: 134 mmol/L — ABNORMAL LOW (ref 135–145)

## 2024-01-19 LAB — PHOSPHORUS: Phosphorus: 3.7 mg/dL (ref 2.5–4.6)

## 2024-01-19 NOTE — TOC CM/SW Note (Signed)
 Transition of Care [facility]) - Inpatient Brief Assessment   Patient Details  Name: Belinda Oneill MRN: [id number] Date of Birth: 08-26-1961  [facility] of [facility] CM/SW Contact:    Chapman Fitch, RN Phone Number: 01/19/2024, 3:57 PM   Clinical Narrative:  Admitted [id number] unilateral obstructive uropathy s/p nephrostomy tube placement Admitted from: home with mother PCP: Patient was previously seen by Dr Nemiah Commander, however after losing her insurance is no longer being seen by her.  [facility]  information added to [facility]S Pharmacy: Pharmacy updated to [facility].  Plan for meds to bed at discharge. Pharmacist aware   Transition of Care Asessment: Insurance and Status: Selfpay Patient has primary care physician: No     Prior/Current Home Services: No current home services Social Drivers of Health Review: SDOH reviewed no interventions necessary Readmission risk has been reviewed: Yes Transition of care needs: no transition of care needs at this time

## 2024-01-19 NOTE — Discharge Instructions (Addendum)

## 2024-01-19 NOTE — Plan of Care (Signed)

## 2024-01-19 NOTE — Plan of Care (Signed)

## 2024-01-19 NOTE — Progress Notes (Signed)
 Referring Physician(s): Michiel Cowboy  Supervising Physician: Gilmer Mor  Patient Status:  [facility] - In-pt  Chief Complaint:  Right nephrolithiasis; right hydronephrosis - s/p image guided right percutaneous nephrostomy placement on 01/15/24  This is a 63 year old female with a PMH of HTN and kidney stones who is currently inpatient. She came to the ED initially on 3/10 where CT imaging revealed hydrolithiasis with hydronephrosis; she was discharged with pain management and f/u for urology. In the meantime, pt began to experience fevers, poor appetite, and increased R flank pain for which she returned to the ED on 3/19. She was hospitalized and IR was consulted for R PCN which was approved and placed 3/20 by Dr. Fredia Sorrow. She continues IV abx.  Subjective:  Today, she is sitting up on the side of bed at time of exam. She is pleasant, calm, and interested in discussing care. Shares that she continues to experience nighttime subj fever/chills, R flank pain. She continues to void cloudy urine, and urine in gravity bag also appears purulent.    Allergies: Patient has no known allergies.  Medications: Prior to Admission medications   Medication Sig Start Date End Date Taking? Authorizing Provider  acetaminophen (TYLENOL) 500 MG tablet Take 2 tablets (1,000 mg total) by mouth every 6 (six) hours as needed. 01/05/24 01/04/25 Yes Evans, Alexandra, PA-C  famotidine (PEPCID) 20 MG tablet Take 1 tablet (20 mg total) by mouth 2 (two) times daily. 04/02/20 01/18/24 Yes Miguel Aschoff., MD  ferrous sulfate 325 (65 FE) MG EC tablet Take 1 tablet (325 mg total) by mouth daily with breakfast. 01/05/24  Yes Gladys Damme, PA-C  tamsulosin (FLOMAX) 0.4 MG CAPS capsule Take 1 capsule (0.4 mg total) by mouth daily for 14 days. 01/05/24 01/19/24 Yes Evans, Alexandra, PA-C  furosemide (LASIX) 20 MG tablet Take 1 tablet (20 mg total) by mouth daily. 05/30/17 01/18/24  Emily Filbert, MD  pantoprazole  (PROTONIX) 40 MG tablet Take 1 tablet (40 mg total) by mouth daily. 08/09/19 08/08/20  Minna Antis, MD     Vital Signs: BP 112/67 (BP Location: Right Arm)   Pulse 99   Temp 98.7 F (37.1 C) (Oral)   Resp 16   Ht 5' 4.5" (1.638 m)   Wt 138 lb 14.2 oz (63 kg)   SpO2 94%   BMI 23.47 kg/m   Physical Exam Constitutional:      Appearance: She is normal weight.  Cardiovascular:     Rate and Rhythm: Normal rate and regular rhythm.     Heart sounds: Normal heart sounds.  Pulmonary:     Effort: Pulmonary effort is normal.     Breath sounds: Normal breath sounds.  Genitourinary:    Comments: R flank - R PCN present, draining purulent fluid, without erythema or swelling at insertion site. Dressing C/D/I. Tenderness to R flank.  Neurological:     Mental Status: She is alert and oriented to person, place, and time.      Imaging: IR NEPHROSTOMY PLACEMENT RIGHT Result Date: 01/15/2024 CLINICAL DATA:  Obstructed right kidney secondary to central and proximal ureteral calculi. Suspected chronic obstruction with infection. EXAM: 1. ULTRASOUND GUIDANCE FOR PUNCTURE OF THE RIGHT RENAL COLLECTING SYSTEM. 2. RIGHT PERCUTANEOUS NEPHROSTOMY TUBE PLACEMENT. COMPARISON:  None Available. ANESTHESIA/SEDATION: Moderate (conscious) sedation was employed during this procedure. A total of Versed 2.5 mg and Fentanyl 125 mcg was administered intravenously. Moderate Sedation Time: 25 minutes. The patient's level of consciousness and vital signs were monitored continuously  by radiology nursing throughout the procedure under my direct supervision. CONTRAST:  12 mL Omnipaque 300 MEDICATIONS: A scheduled dose of 2 g of IV Rocephin was given just prior to the procedure. FLUOROSCOPY TIME:  50 seconds.  4.0 mGy. PROCEDURE: The procedure, risks, benefits, and alternatives were explained to the patient. Questions regarding the procedure were encouraged and answered. The patient understands and consents to the  procedure. A time-out was performed prior to initiating the procedure. The left flank region was prepped with chlorhexidine in a sterile fashion, and a sterile drape was applied covering the operative field. A sterile gown and sterile gloves were used for the procedure. Local anesthesia was provided with 1% Lidocaine. Ultrasound was used to localize the right kidney. Under ultrasound image was saved and recorded. Under direct ultrasound guidance, an 18 gauge trocar needle was advanced into the right renal collecting system. Ultrasound image documentation was performed. Aspiration of urine sample was performed followed by contrast injection. Percutaneous tract dilatation was then performed over the guidewire. A 10-French percutaneous nephrostomy tube was then advanced and formed in the collecting system. Catheter position was confirmed by fluoroscopy after contrast injection. The catheter was secured at the skin with a Prolene retention suture and Stat-Lock device. The nephrostomy tube was attached to a gravity bag. COMPLICATIONS: None. FINDINGS: Ultrasound demonstrates massive hydronephrosis of the right kidney with cortical thinning. Aspiration at the level of the collecting system demonstrates grossly purulent urine return. 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 The nephrostomy tube was formed at the level of the renal pelvis. There is return of purulent urine after tube placement. IMPRESSION: Right percutaneous nephrostomy tube placement. Grossly purulent urine return noted with a sample sent for culture analysis. A 10 French nephrostomy tube was placed and formed in the renal pelvis. This will be kept to gravity bag drainage. Electronically Signed   By: Irish Lack M.D.   On: 01/15/2024 15:51    Labs:  CBC: Recent Labs    01/16/24 0501 01/17/24 0523 01/18/24 0615 01/19/24 0523  WBC 11.7* 11.3* 12.6* 11.3*  HGB 7.9* 7.7* 8.3* 7.7*  HCT 24.5* 23.0* 24.9* 23.2*   PLT 533* 557* 593* 576*    COAGS: Recent Labs    01/15/24 0843  INR 1.3*  APTT 43*    BMP: Recent Labs    01/14/24 1549 01/15/24 0544 01/16/24 0501 01/19/24 0523  NA 130* 136 133* 134*  K 4.3 3.9 4.0 3.9  CL 96* 104 103 99  CO2 26 22 23 25   GLUCOSE 130* 110* 108* 99  BUN 11 7* 6* 7*  CALCIUM 9.6 8.6* 8.4* 9.0  CREATININE 0.85 0.79 0.82 0.78  GFRNONAA >60 >60 >60 >60    LIVER FUNCTION TESTS: Recent Labs    01/05/24 1608 01/14/24 1549  BILITOT 0.6 0.6  AST 23 22  ALT 27 27  ALKPHOS 75 93  PROT 8.1 8.5*  ALBUMIN 3.1* 2.7*    Assessment and Plan:  S/p R PCN 3/20  - No significant change in drain status or concern for new infection - afebrile on exam, subj fevers sporadically, VSS - WBC improved from 12.6 on 3/23 to 11.3 3/24 - continued purulent drainage without blood clots noted - hgb decreased from 8.3 3/23 to 7.7 today, no obvious bleeding noted at PCN insertion site or in collection bag - continued R flank pain comparable to admitting pain, not increased after drain placement  If the PCN is going to be present long term, the patient  will need routine exchanges about every 6-10 weeks. IR will order exchange and scheduler will contact patient with appointment details. Please contact IR for orders prior to discharge.  Electronically Signed: Carlton Adam, NP 01/19/2024, 1:22 PM   I spent a total of 15 Minutes at the the patient's bedside AND on the patient's hospital floor or unit, greater than 50% of which was counseling/coordinating care for s/p right image guided percutaneous drain 01/15/24

## 2024-01-19 NOTE — Progress Notes (Signed)
 Progress Note   Patient: Belinda Oneill ZOX:[id number] DOB: 18-Oct-1961 DOA: 01/14/2024     5 DOS: the patient was seen and examined on 01/19/2024   Brief hospital course: Belinda Oneill is a 63 y.o. African-American female with medical history significant for urolithiasis, who presented to the ER with acute onset of right flank pain which has been intermittent over the last 2 to 3 weeks, with associated urinary frequency and urgency as well as tactile fever and chills.  Tmax at home was 100.1.  She had a CT scan on 3/10, showed multiple large stones in the right renal pelvis extending into the proximal right ureter with a moderate to severe right hydronephrosis.  He came back to the emergency room with worsening back pain and a fever.  He was diagnosed with acute pyelonephritis, was started on IV antibiotics.  Urology consult obtained, patient is going to have a urostomy tube placed.   Principal Problem:   Acute unilateral obstructive uropathy Active Problems:   Elevated blood pressure reading   Acute lower UTI   Tobacco abuse   GERD without esophagitis   Acute pyelonephritis   Reactive thrombocytosis   Assessment and Plan:  Acute unilateral obstructive uropathy Acute pyelonephritis secondary to Proteus mirabilis. Patient did not meet sepsis criteria at the time of admission, sepsis ruled out. Urine culture sent out, pending results.  Blood culture was not sent. Continue antibiotics Patient is scheduled to have left nephrostomy placed today due to multiple large stones. Patient still has significant purulent urine from nephrostomy tube, culture from urine grew Proteus mirabilis, pansensitive, culture from nephrostomy tube still pending final susceptibility.  Assumed to be the same. Patient still has pain in the back, still have purulent drainage from nephrostomy tube.   Still complaining significant pain, not ready to discharge.  Will continue another day of Rocephin, continue pain  medicine.  Will completed in total of 2 weeks of antibiotics at discharge   Hyponatremia. Recheck level tomorrow.   Anemia of chronic disease. Reactive thrombocytosis. Patient does not have any active bleeding, hemoglobin dropped down to 7.7 today.  Patient has a low iron, elevation of ferritin.  12 level normal.  I will continue oral iron treatment, but changed to iron polysaccharide to increase absorption.    Tobacco abuse Smoking cessation advised.   GERD without esophagitis - We will continue PPI therapy and H2 blocker therapy..     Subjective:  Still complaining significant pain in the right flank.  Still has improvement drainage with the flushes every 8 hours.  Physical Exam: Vitals:   01/18/24 2018 01/19/24 0214 01/19/24 0407 01/19/24 0923  BP: 103/60 127/71 (!) 100/55 112/67  Pulse: 83 98 74 99  Resp: 18 18 18 16   Temp: 98.4 F (36.9 C) 98.9 F (37.2 C) 98.3 F (36.8 C) 98.7 F (37.1 C)  TempSrc: Oral Oral Oral Oral  SpO2: 100% 99% 98% 94%  Weight:      Height:       General exam: Appears calm and comfortable  Respiratory system: Clear to auscultation. Respiratory effort normal. Cardiovascular system: S1 & S2 heard, RRR. No JVD, murmurs, rubs, gallops or clicks. No pedal edema. Gastrointestinal system: Abdomen is nondistended, soft and nontender. No organomegaly or masses felt. Normal bowel sounds heard. Central nervous system: Alert and oriented. No focal neurological deficits. Extremities: Symmetric 5 x 5 power. Skin: No rashes, lesions or ulcers Psychiatry: Judgement and insight appear normal. Mood & affect appropriate.    Data Reviewed:  Lab results reviewed.  Family Communication: None  Disposition: Status is: Inpatient Remains inpatient appropriate because: Severity of disease, IV treatment.     Time spent: 35 minutes  Author: Marrion Coy, MD 01/19/2024 12:26 PM  For on call review [email or web address].

## 2024-01-20 ENCOUNTER — Other Ambulatory Visit: Payer: Self-pay

## 2024-01-20 LAB — CBC
HCT: 24.7 % — ABNORMAL LOW (ref 36.0–46.0)
Hemoglobin: 8.2 g/dL — ABNORMAL LOW (ref 12.0–15.0)
MCH: 27.8 pg (ref 26.0–34.0)
MCHC: 33.2 g/dL (ref 30.0–36.0)
MCV: 83.7 fL (ref 80.0–100.0)
Platelets: 634 10*3/uL — ABNORMAL HIGH (ref 150–400)
RBC: 2.95 MIL/uL — ABNORMAL LOW (ref 3.87–5.11)
RDW: 13.8 % (ref 11.5–15.5)
WBC: 9.7 10*3/uL (ref 4.0–10.5)
nRBC: 0 % (ref 0.0–0.2)

## 2024-01-20 MED ORDER — CEPHALEXIN 500 MG PO CAPS
500.0000 mg | ORAL_CAPSULE | Freq: Four times a day (QID) | ORAL | 0 refills | Status: DC
  Filled 2024-01-20: qty 36, 9d supply, fill #0

## 2024-01-20 MED ORDER — TAMSULOSIN HCL 0.4 MG PO CAPS
0.4000 mg | ORAL_CAPSULE | Freq: Every day | ORAL | 0 refills | Status: DC
  Filled 2024-01-20: qty 14, 14d supply, fill #0

## 2024-01-20 MED ORDER — OXYCODONE-ACETAMINOPHEN 5-325 MG PO TABS
1.0000 | ORAL_TABLET | ORAL | 0 refills | Status: DC | PRN
  Filled 2024-01-20: qty 16, 2d supply, fill #0

## 2024-01-20 MED ORDER — SENNOSIDES-DOCUSATE SODIUM 8.6-50 MG PO TABS
2.0000 | ORAL_TABLET | Freq: Two times a day (BID) | ORAL | 0 refills | Status: DC | PRN
  Filled 2024-01-20: qty 100, 25d supply, fill #0

## 2024-01-20 MED ORDER — POLYSACCHARIDE IRON COMPLEX 150 MG PO CAPS
150.0000 mg | ORAL_CAPSULE | Freq: Every day | ORAL | 0 refills | Status: DC
  Filled 2024-01-20: qty 30, 30d supply, fill #0

## 2024-01-20 NOTE — TOC Transition Note (Signed)
 Transition of Care [facility]) - Discharge Note   Patient Details  Name: Belinda Oneill MRN: [id number] Date of Birth: 1961-09-20  Transition of Care [facility]) CM/SW Contact:  Chapman Fitch, RN Phone Number: 01/20/2024, 10:13 AM   Clinical Narrative:      Patient to discharge today Meds to Bed, pharmacy aware Bedside RN to educate patient on nephrostomy tube flushes and provide flushes at discharge       Patient Goals and CMS Choice            Discharge Placement                       Discharge Plan and Services Additional resources added to the After Visit Summary for                                       Social Drivers of Health (SDOH) Interventions SDOH Screenings   Food Insecurity: No Food Insecurity (01/15/2024)  Housing: Low Risk  (01/15/2024)  Transportation Needs: No Transportation Needs (01/15/2024)  Utilities: Not At Risk (01/15/2024)  Social Connections: Unknown (01/14/2024)  Tobacco Use: High Risk (01/15/2024)     Readmission Risk Interventions     No data to display

## 2024-01-20 NOTE — Plan of Care (Signed)

## 2024-01-20 NOTE — Discharge Summary (Signed)
 Physician Discharge Summary   Patient: Belinda Oneill MRN: [id number] DOB: 1961/10/04  Admit date:     01/14/2024  Discharge date: 01/20/24  Discharge Physician: Marrion Coy   PCP: Pcp, No   Recommendations at discharge:   TOC to set up PCP follow-up. Follow-up with urology in 2 weeks.  Discharge Diagnoses: Principal Problem:   Acute unilateral obstructive uropathy Active Problems:   Elevated blood pressure reading   Acute lower UTI   Tobacco abuse   GERD without esophagitis   Acute pyelonephritis   Reactive thrombocytosis  Resolved Problems:   * No resolved hospital problems. *  Hospital Course: Belinda Oneill is a 63 y.o. African-American female with medical history significant for urolithiasis, who presented to the ER with acute onset of right flank pain which has been intermittent over the last 2 to 3 weeks, with associated urinary frequency and urgency as well as tactile fever and chills.  Tmax at home was 100.1.  She had a CT scan on 3/10, showed multiple large stones in the right renal pelvis extending into the proximal right ureter with a moderate to severe right hydronephrosis.  He came back to the emergency room with worsening back pain and a fever.  He was diagnosed with acute pyelonephritis, was started on IV antibiotics.  Urology consult obtained, patient is going to have a urostomy tube placed. Patient has improved, urine culture finalized.  Will continue oral antibiotic completed 2 weeks course.  Follow-up with urology in 2 weeks.  Assessment and Plan: Acute unilateral obstructive uropathy due to kidney stone. Acute pyelonephritis secondary to Proteus mirabilis. Patient did not meet sepsis criteria at the time of admission, sepsis ruled out. Urine culture sent out, pending results.  Blood culture was not sent. Continue antibiotics Patient is scheduled to have left nephrostomy placed today due to multiple large stones. Patient still has significant purulent urine  from nephrostomy tube, culture from urine grew Proteus mirabilis, pansensitive, culture from nephrostomy analyzed with the same bacteria. Condition has improved, still has some mild flank pain.  Still has some purulent nephrostomy draining.  But medically stable for discharge.  Will provide 3 times daily flushes with normal saline and follow-up with PCP and nephrology. Based on susceptibility, will continue Keflex 500 mg 4 times a day with total antibiotic course of 2 weeks.   Hyponatremia. Stable.   Anemia of chronic disease. Reactive thrombocytosis. Patient does not have any active bleeding, hemoglobin dropped down to 7.7 today.  Patient has a low iron, elevation of ferritin.  12 level normal.  I will continue oral iron treatment, but changed to iron polysaccharide to increase absorption.  Hemoglobin improving.   Tobacco abuse Smoking cessation advised.   GERD without esophagitis - We will continue PPI therapy and H2 blocker therapy.       Consultants: Urology. Procedures performed: Nephrostomy per IR. Disposition: Home Diet recommendation:  Discharge Diet Orders (From admission, onward)     Start     Ordered   01/20/24 0000  Diet - low sodium heart healthy        01/20/24 1005           Cardiac diet DISCHARGE MEDICATION: Allergies as of 01/20/2024   No Known Allergies      Medication List     STOP taking these medications    ferrous sulfate 325 (65 FE) MG EC tablet   furosemide 20 MG tablet Commonly known as: Lasix       TAKE these medications  acetaminophen 500 MG tablet Commonly known as: TYLENOL Take 2 tablets (1,000 mg total) by mouth every 6 (six) hours as needed.   cephALEXin 500 MG capsule Commonly known as: KEFLEX Take 1 capsule (500 mg total) by mouth 4 (four) times daily for 9 days.   famotidine 20 MG tablet Commonly known as: PEPCID Take 1 tablet (20 mg total) by mouth 2 (two) times daily.   iron polysaccharides 150 MG  capsule Commonly known as: NIFEREX Take 1 capsule (150 mg total) by mouth daily. Start taking on: January 21, 2024   oxyCODONE-acetaminophen 5-325 MG tablet Commonly known as: PERCOCET/ROXICET Take 1-2 tablets by mouth every 4 (four) hours as needed for moderate pain (pain score 4-6).   pantoprazole 40 MG tablet Commonly known as: Protonix Take 1 tablet (40 mg total) by mouth daily.   senna-docusate 8.6-50 MG tablet Commonly known as: Senokot-S Take 2 tablets by mouth 2 (two) times daily as needed for mild constipation.   tamsulosin 0.4 MG Caps capsule Commonly known as: FLOMAX Take 1 capsule (0.4 mg total) by mouth daily for 14 days.               Discharge Care Instructions  (From admission, onward)           Start     Ordered   01/20/24 0000  Discharge wound care:       Comments: Saline flush of nephrostomy   01/20/24 1005            Follow-up Information     Vanna Scotland, MD Follow up in 2 week(s).   Specialty: Urology Contact information: [address] [place] [address] [phone]                Discharge Exam: Ceasar Mons Weights   01/15/24 0006  Weight: 63 kg   General exam: Appears calm and comfortable  Respiratory system: Clear to auscultation. Respiratory effort normal. Cardiovascular system: S1 & S2 heard, RRR. No JVD, murmurs, rubs, gallops or clicks. No pedal edema. Gastrointestinal system: Abdomen is nondistended, soft and nontender. No organomegaly or masses felt. Normal bowel sounds heard. Central nervous system: Alert and oriented. No focal neurological deficits. Extremities: Symmetric 5 x 5 power. Skin: No rashes, lesions or ulcers Psychiatry: Judgement and insight appear normal. Mood & affect appropriate.    Condition at discharge: good  The results of significant diagnostics from this hospitalization (including imaging, microbiology, ancillary and laboratory) are listed below for reference.    Imaging Studies: IR NEPHROSTOMY PLACEMENT RIGHT Result Date: 01/15/2024 CLINICAL DATA:  Obstructed right kidney secondary to central and proximal ureteral calculi. Suspected chronic obstruction with infection. EXAM: 1. ULTRASOUND GUIDANCE FOR PUNCTURE OF THE RIGHT RENAL COLLECTING SYSTEM. 2. RIGHT PERCUTANEOUS NEPHROSTOMY TUBE PLACEMENT. COMPARISON:  None Available. ANESTHESIA/SEDATION: Moderate (conscious) sedation was employed during this procedure. A total of Versed 2.5 mg and Fentanyl 125 mcg was administered intravenously. Moderate Sedation Time: 25 minutes. The patient's level of consciousness and vital signs were monitored continuously by radiology nursing throughout the procedure under my direct supervision. CONTRAST:  12 mL Omnipaque 300 MEDICATIONS: A scheduled dose of 2 g of IV Rocephin was given just prior to the procedure. FLUOROSCOPY TIME:  50 seconds.  4.0 mGy. PROCEDURE: The procedure, risks, benefits, and alternatives were explained to the patient. Questions regarding the procedure were encouraged and answered. The patient understands and consents to the procedure. A time-out was performed prior to initiating the procedure. The left flank region was  prepped with chlorhexidine in a sterile fashion, and a sterile drape was applied covering the operative field. A sterile gown and sterile gloves were used for the procedure. Local anesthesia was provided with 1% Lidocaine. Ultrasound was used to localize the right kidney. Under ultrasound image was saved and recorded. Under direct ultrasound guidance, an 18 gauge trocar needle was advanced into the right renal collecting system. Ultrasound image documentation was performed. Aspiration of urine sample was performed followed by contrast injection. Percutaneous tract dilatation was then performed over the guidewire. A 10-French percutaneous nephrostomy tube was then advanced and formed in the collecting system. Catheter position was confirmed by  fluoroscopy after contrast injection. The catheter was secured at the skin with a Prolene retention suture and Stat-Lock device. The nephrostomy tube was attached to a gravity bag. COMPLICATIONS: None. FINDINGS: Ultrasound demonstrates massive hydronephrosis of the right kidney with cortical thinning. Aspiration at the level of the collecting system demonstrates grossly purulent urine return. 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 The nephrostomy tube was formed at the level of the renal pelvis. There is return of purulent urine after tube placement. IMPRESSION: Right percutaneous nephrostomy tube placement. Grossly purulent urine return noted with a sample sent for culture analysis. A 10 French nephrostomy tube was placed and formed in the renal pelvis. This will be kept to gravity bag drainage. Electronically Signed   By: Irish Lack M.D.   On: 01/15/2024 15:51   DG Chest 2 View Result Date: 01/14/2024 CLINICAL DATA:  Upper abdominal pain. EXAM: CHEST - 2 VIEW COMPARISON:  Chest radiograph 05/21/2022. Included lung bases from abdominopelvic CT 01/05/2024 FINDINGS: The cardiomediastinal contours are normal. The lungs are clear. Pulmonary vasculature is normal. No consolidation, pleural effusion, or pneumothorax. No acute osseous abnormalities are seen. Left breast calcification. IMPRESSION: No active cardiopulmonary disease. Electronically Signed   By: Narda Rutherford M.D.   On: 01/14/2024 17:23   CT ABDOMEN PELVIS WO CONTRAST Result Date: 01/05/2024 CLINICAL DATA:  Abdominal/flank pain. EXAM: CT ABDOMEN AND PELVIS WITHOUT CONTRAST TECHNIQUE: Multidetector CT imaging of the abdomen and pelvis was performed following the standard protocol without IV contrast. RADIATION DOSE REDUCTION: This exam was performed according to the departmental dose-optimization program which includes automated exposure control, adjustment of the mA and/or kV according to patient size and/or  use of iterative reconstruction technique. COMPARISON:  CT abdomen pelvis dated 06/07/2022. FINDINGS: Evaluation of this exam is limited in the absence of intravenous contrast. Lower chest: The visualized lung bases are clear. No intra-abdominal free air or free fluid. Hepatobiliary: The liver is unremarkable. No biliary dilatation. The gallbladder is unremarkable. Pancreas: Unremarkable. No pancreatic ductal dilatation or surrounding inflammatory changes. Spleen: Normal in size without focal abnormality. Adrenals/Urinary Tract: The adrenal glands are unremarkable. Multiple large stones in the right renal pelvis extending into the proximal right ureter. The largest stone measures approximately 2.7 cm in length. There is moderate to severe right hydronephrosis. Additional stones noted in the upper pole collecting system. Multiple nonobstructing left renal calculi measure up to 9 mm in the upper pole. No hydronephrosis on the left. The urinary bladder is unremarkable. Stomach/Bowel: Several scattered colonic diverticula. There is no bowel obstruction or active inflammation. The appendix is normal. Vascular/Lymphatic: Moderate aortoiliac atherosclerotic disease. The IVC is unremarkable. No portal venous gas. There is no adenopathy. Reproductive: The uterus is grossly unremarkable. No suspicious adnexal masses. Other: None Musculoskeletal: No acute osseous pathology. IMPRESSION: 1. Multiple large stones in the right renal pelvis extending into  the proximal right ureter with moderate to severe right hydronephrosis. 2. Multiple nonobstructing left renal calculi. No hydronephrosis on the left. 3. No bowel obstruction. Normal appendix. Electronically Signed   By: Elgie Collard M.D.   On: 01/05/2024 19:48    Microbiology: Results for orders placed or performed during the hospital encounter of 01/14/24  Urine Culture     Status: Abnormal   Collection Time: 01/14/24  3:49 PM   Specimen: Urine, Random  Result Value  Ref Range Status   Specimen Description   Final    URINE, RANDOM Performed at [facility], [address] [place], [place] [address]    Special Requests   Final    NONE Reflexed from [id number] Performed at [facility], [address] [place], [place] [address]    Culture >=100,000 COLONIES/mL PROTEUS MIRABILIS (A)  Final   Report Status 01/16/2024 FINAL  Final   Organism ID, Bacteria PROTEUS MIRABILIS (A)  Final      Susceptibility   Proteus mirabilis - MIC*    AMPICILLIN <=2 SENSITIVE Sensitive     CEFAZOLIN <=4 SENSITIVE Sensitive     CEFEPIME <=0.12 SENSITIVE Sensitive     CEFTRIAXONE <=0.25 SENSITIVE Sensitive     CIPROFLOXACIN <=0.25 SENSITIVE Sensitive     GENTAMICIN <=1 SENSITIVE Sensitive     IMIPENEM 2 SENSITIVE Sensitive     NITROFURANTOIN 128 RESISTANT Resistant     TRIMETH/SULFA <=20 SENSITIVE Sensitive     AMPICILLIN/SULBACTAM <=2 SENSITIVE Sensitive     PIP/TAZO <=4 SENSITIVE Sensitive ug/mL    * >=100,000 COLONIES/mL PROTEUS MIRABILIS  Urine Culture (for pregnant, neutropenic or urologic patients or patients with an indwelling urinary catheter)     Status: Abnormal   Collection Time: 01/15/24  3:18 PM   Specimen: Kidney; Urine  Result Value Ref Range Status   Specimen Description   Final    KIDNEY Performed at [facility], [address] [place], [place] [address]    Special Requests   Final    URINE ASPIRATE FROM RIGHT KIDNEY DURING PROCEDURE Performed at [facility], [address] [place], [place] [address]    Culture 60,000 COLONIES/mL PROTEUS MIRABILIS (A)  Final   Report Status 01/18/2024 FINAL  Final   Organism ID, Bacteria PROTEUS MIRABILIS (A)  Final      Susceptibility   Proteus mirabilis - MIC*    AMPICILLIN <=2 SENSITIVE Sensitive     CEFEPIME <=0.12 SENSITIVE Sensitive     CEFTAZIDIME <=1 SENSITIVE Sensitive     CEFTRIAXONE <=0.25 SENSITIVE Sensitive     CIPROFLOXACIN <=0.25 SENSITIVE Sensitive      GENTAMICIN <=1 SENSITIVE Sensitive     IMIPENEM 2 SENSITIVE Sensitive     TRIMETH/SULFA <=20 SENSITIVE Sensitive     AMPICILLIN/SULBACTAM <=2 SENSITIVE Sensitive     PIP/TAZO <=4 SENSITIVE Sensitive ug/mL    * 60,000 COLONIES/mL PROTEUS MIRABILIS    Labs: CBC: Recent Labs  Lab 01/14/24 1549 01/15/24 0544 01/16/24 0501 01/17/24 0523 01/18/24 0615 01/19/24 0523 01/20/24 0511  WBC 11.9*   < > 11.7* 11.3* 12.6* 11.3* 9.7  NEUTROABS 9.8*  --   --   --   --   --   --   HGB 10.3*   < > 7.9* 7.7* 8.3* 7.7* 8.2*  HCT 31.1*   < > 24.5* 23.0* 24.9* 23.2* 24.7*  MCV 86.1   < > 88.1 84.2 85.3 83.8 83.7  PLT 644*   < > 533* 557* 593*  576* 634*   < > = values in this interval not displayed.   Basic Metabolic Panel: Recent Labs  Lab 01/14/24 1549 01/15/24 0544 01/16/24 0501 01/19/24 0523  NA 130* 136 133* 134*  K 4.3 3.9 4.0 3.9  CL 96* 104 103 99  CO2 26 22 23 25   GLUCOSE 130* 110* 108* 99  BUN 11 7* 6* 7*  CREATININE 0.85 0.79 0.82 0.78  CALCIUM 9.6 8.6* 8.4* 9.0  MG  --   --  1.9 2.0  PHOS  --   --   --  3.7   Liver Function Tests: Recent Labs  Lab 01/14/24 1549  AST 22  ALT 27  ALKPHOS 93  BILITOT 0.6  PROT 8.5*  ALBUMIN 2.7*   CBG: No results for input(s): "GLUCAP" in the last 168 hours.  Discharge time spent: greater than 30 minutes.  Signed: Marrion Coy, MD [facility] 01/20/2024

## 2024-01-20 NOTE — Progress Notes (Signed)
 TOC Meds received by patient at bedside, flushing and nephrostomy education provided by RN, patient understood by teach back method. Supplies such as saline flushes, split gauze, alcohol wipes and mepilex foam given to patient.

## 2024-01-26 ENCOUNTER — Inpatient Hospital Stay
Admission: EM | Admit: 2024-01-26 | Discharge: 2024-02-06 | DRG: 660 | Disposition: A | Discharge status: Home / Self Care | Payer: Self-pay | Attending: Internal Medicine | Admitting: Internal Medicine

## 2024-01-26 ENCOUNTER — Other Ambulatory Visit: Payer: Self-pay

## 2024-01-26 ENCOUNTER — Emergency Department: Payer: Self-pay

## 2024-01-26 DIAGNOSIS — N1330 Unspecified hydronephrosis: Secondary | ICD-10-CM | POA: Insufficient documentation

## 2024-01-26 DIAGNOSIS — F17210 Nicotine dependence, cigarettes, uncomplicated: Secondary | ICD-10-CM | POA: Diagnosis present

## 2024-01-26 DIAGNOSIS — D75839 Thrombocytosis, unspecified: Secondary | ICD-10-CM

## 2024-01-26 DIAGNOSIS — I10 Essential (primary) hypertension: Secondary | ICD-10-CM | POA: Diagnosis present

## 2024-01-26 DIAGNOSIS — M549 Dorsalgia, unspecified: Secondary | ICD-10-CM | POA: Insufficient documentation

## 2024-01-26 DIAGNOSIS — Z716 Tobacco abuse counseling: Secondary | ICD-10-CM

## 2024-01-26 DIAGNOSIS — E871 Hypo-osmolality and hyponatremia: Secondary | ICD-10-CM | POA: Diagnosis not present

## 2024-01-26 DIAGNOSIS — F419 Anxiety disorder, unspecified: Secondary | ICD-10-CM | POA: Diagnosis present

## 2024-01-26 DIAGNOSIS — T80818A Extravasation of other vesicant agent, initial encounter: Secondary | ICD-10-CM | POA: Diagnosis present

## 2024-01-26 DIAGNOSIS — N12 Tubulo-interstitial nephritis, not specified as acute or chronic: Principal | ICD-10-CM | POA: Diagnosis present

## 2024-01-26 DIAGNOSIS — N138 Other obstructive and reflux uropathy: Secondary | ICD-10-CM | POA: Diagnosis present

## 2024-01-26 DIAGNOSIS — G4700 Insomnia, unspecified: Secondary | ICD-10-CM | POA: Diagnosis present

## 2024-01-26 DIAGNOSIS — K5900 Constipation, unspecified: Secondary | ICD-10-CM | POA: Diagnosis present

## 2024-01-26 DIAGNOSIS — N136 Pyonephrosis: Principal | ICD-10-CM | POA: Diagnosis present

## 2024-01-26 DIAGNOSIS — Y848 Other medical procedures as the cause of abnormal reaction of the patient, or of later complication, without mention of misadventure at the time of the procedure: Secondary | ICD-10-CM | POA: Diagnosis present

## 2024-01-26 DIAGNOSIS — Z79899 Other long term (current) drug therapy: Secondary | ICD-10-CM

## 2024-01-26 DIAGNOSIS — K219 Gastro-esophageal reflux disease without esophagitis: Secondary | ICD-10-CM | POA: Diagnosis present

## 2024-01-26 DIAGNOSIS — N390 Urinary tract infection, site not specified: Secondary | ICD-10-CM

## 2024-01-26 DIAGNOSIS — D75838 Other thrombocytosis: Secondary | ICD-10-CM | POA: Diagnosis present

## 2024-01-26 DIAGNOSIS — Z720 Tobacco use: Secondary | ICD-10-CM | POA: Diagnosis present

## 2024-01-26 DIAGNOSIS — N139 Obstructive and reflux uropathy, unspecified: Secondary | ICD-10-CM

## 2024-01-26 LAB — BASIC METABOLIC PANEL WITH GFR
Anion gap: 12 (ref 5–15)
BUN: 9 mg/dL (ref 8–23)
CO2: 23 mmol/L (ref 22–32)
Calcium: 9.3 mg/dL (ref 8.9–10.3)
Chloride: 101 mmol/L (ref 98–111)
Creatinine, Ser: 0.9 mg/dL (ref 0.44–1.00)
GFR, Estimated: >60 mL/min (ref >60)
Glucose, Bld: 125 mg/dL — ABNORMAL HIGH (ref 70–99)
Potassium: 3.6 mmol/L (ref 3.5–5.1)
Sodium: 136 mmol/L (ref 135–145)

## 2024-01-26 LAB — CBC
HCT: 27.7 % — ABNORMAL LOW (ref 36.0–46.0)
Hemoglobin: 8.9 g/dL — ABNORMAL LOW (ref 12.0–15.0)
MCH: 27.6 pg (ref 26.0–34.0)
MCHC: 32.1 g/dL (ref 30.0–36.0)
MCV: 86 fL (ref 80.0–100.0)
Platelets: 656 10*3/uL — ABNORMAL HIGH (ref 150–400)
RBC: 3.22 MIL/uL — ABNORMAL LOW (ref 3.87–5.11)
RDW: 14.3 % (ref 11.5–15.5)
WBC: 9.3 10*3/uL (ref 4.0–10.5)
nRBC: 0 % (ref 0.0–0.2)

## 2024-01-26 LAB — URINALYSIS, W/ REFLEX TO CULTURE (INFECTION SUSPECTED)
Bacteria, UA: NONE SEEN
Bilirubin Urine: NEGATIVE
Glucose, UA: NEGATIVE mg/dL
Hgb urine dipstick: NEGATIVE
Ketones, ur: NEGATIVE mg/dL
Nitrite: NEGATIVE
Protein, ur: 30 mg/dL — AB
RBC / HPF: >50 RBC/hpf (ref 0–5)
Specific Gravity, Urine: 1.043 — ABNORMAL HIGH (ref 1.005–1.030)
WBC, UA: >50 WBC/hpf (ref 0–5)
pH: 6 (ref 5.0–8.0)

## 2024-01-26 LAB — URINALYSIS, ROUTINE W REFLEX MICROSCOPIC
Bilirubin Urine: NEGATIVE
Glucose, UA: NEGATIVE mg/dL
Hgb urine dipstick: NEGATIVE
Ketones, ur: NEGATIVE mg/dL
Nitrite: NEGATIVE
Protein, ur: 30 mg/dL — AB
RBC / HPF: >50 RBC/hpf (ref 0–5)
Specific Gravity, Urine: 1.043 — ABNORMAL HIGH (ref 1.005–1.030)
WBC, UA: >50 WBC/hpf (ref 0–5)
pH: 6 (ref 5.0–8.0)

## 2024-01-26 LAB — LACTIC ACID, PLASMA
Lactic Acid, Venous: 2.4 mmol/L (ref 0.5–1.9)
Lactic Acid, Venous: 1 mmol/L (ref 0.5–1.9)

## 2024-01-26 MED ORDER — HEPARIN SODIUM (PORCINE) 5000 UNIT/ML IJ SOLN
5000.0000 [IU] | Freq: Three times a day (TID) | INTRAMUSCULAR | Status: DC
  Administered 2024-01-27 – 2024-02-06 (×29): 5000 [IU] via SUBCUTANEOUS
  Filled 2024-01-26 (×29): qty 1

## 2024-01-26 MED ORDER — OXYCODONE-ACETAMINOPHEN 5-325 MG PO TABS
1.0000 | ORAL_TABLET | Freq: Once | ORAL | Status: AC
  Administered 2024-01-26: 1 via ORAL
  Filled 2024-01-26: qty 1

## 2024-01-26 MED ORDER — FAMOTIDINE 20 MG PO TABS
20.0000 mg | ORAL_TABLET | Freq: Two times a day (BID) | ORAL | Status: DC
  Administered 2024-01-26 – 2024-02-06 (×22): 20 mg via ORAL
  Filled 2024-01-26 (×22): qty 1

## 2024-01-26 MED ORDER — SODIUM CHLORIDE 0.9 % IV SOLN
2.0000 g | INTRAVENOUS | Status: DC
  Administered 2024-01-27 – 2024-01-31 (×5): 2 g via INTRAVENOUS
  Filled 2024-01-26 (×6): qty 20

## 2024-01-26 MED ORDER — LACTATED RINGERS IV SOLN: INTRAVENOUS | Status: AC

## 2024-01-26 MED ORDER — SODIUM CHLORIDE 0.9 % IV SOLN
2.0000 g | Freq: Once | INTRAVENOUS | Status: AC
  Administered 2024-01-26: 2 g via INTRAVENOUS
  Filled 2024-01-26: qty 20

## 2024-01-26 MED ORDER — HYDROMORPHONE HCL 1 MG/ML IJ SOLN
0.5000 mg | Freq: Once | INTRAMUSCULAR | Status: AC
  Administered 2024-01-26: 0.5 mg via INTRAVENOUS
  Filled 2024-01-26: qty 0.5

## 2024-01-26 MED ORDER — SODIUM CHLORIDE 0.9 % IV BOLUS
1000.0000 mL | Freq: Once | INTRAVENOUS | Status: AC
  Administered 2024-01-26: 1000 mL via INTRAVENOUS

## 2024-01-26 MED ORDER — IOHEXOL 300 MG/ML  SOLN
100.0000 mL | Freq: Once | INTRAMUSCULAR | Status: AC | PRN
  Administered 2024-01-26: 100 mL via INTRAVENOUS

## 2024-01-26 MED ORDER — NICOTINE 14 MG/24HR TD PT24
14.0000 mg | MEDICATED_PATCH | Freq: Every day | TRANSDERMAL | Status: DC | PRN
  Filled 2024-01-26: qty 1

## 2024-01-26 MED ORDER — ONDANSETRON HCL 4 MG PO TABS: 4.0000 mg | ORAL_TABLET | Freq: Four times a day (QID) | ORAL | Status: AC | PRN

## 2024-01-26 MED ORDER — ACETAMINOPHEN 650 MG RE SUPP: 650.0000 mg | Freq: Four times a day (QID) | RECTAL | Status: AC | PRN

## 2024-01-26 MED ORDER — ONDANSETRON HCL 4 MG/2ML IJ SOLN
4.0000 mg | Freq: Four times a day (QID) | INTRAMUSCULAR | Status: AC | PRN
  Administered 2024-01-27: 4 mg via INTRAVENOUS
  Filled 2024-01-26: qty 2

## 2024-01-26 MED ORDER — ACETAMINOPHEN 325 MG PO TABS
650.0000 mg | ORAL_TABLET | Freq: Four times a day (QID) | ORAL | Status: AC | PRN
  Administered 2024-01-27 (×2): 650 mg via ORAL
  Filled 2024-01-26 (×2): qty 2

## 2024-01-26 MED ORDER — HYDROMORPHONE HCL 1 MG/ML IJ SOLN
0.5000 mg | INTRAMUSCULAR | Status: AC | PRN
  Administered 2024-01-26 – 2024-01-27 (×4): 0.5 mg via INTRAVENOUS
  Filled 2024-01-26 (×4): qty 0.5

## 2024-01-26 MED ORDER — SENNA 8.6 MG PO TABS
1.0000 | ORAL_TABLET | Freq: Two times a day (BID) | ORAL | Status: DC
  Administered 2024-01-26 – 2024-02-06 (×22): 8.6 mg via ORAL
  Filled 2024-01-26 (×22): qty 1

## 2024-01-26 MED ORDER — OXYCODONE HCL 5 MG PO TABS
5.0000 mg | ORAL_TABLET | Freq: Four times a day (QID) | ORAL | Status: DC | PRN
  Administered 2024-01-27 – 2024-01-28 (×4): 5 mg via ORAL
  Filled 2024-01-26 (×5): qty 1

## 2024-01-26 NOTE — Assessment & Plan Note (Signed)
 -  As needed nicotine patch ordered ?

## 2024-01-26 NOTE — Hospital Course (Addendum)
 Ms. Minela Bridgewater is a 64 year old female with history of GERD, renal stones, recent percutaneous nephrostomy placement on 01/15/2024, who presents emergency department for chief concerns of back pain and fever at home.  Vitals in the ED showed temperature of 98.3, respiration rate 17, heart rate 97, blood pressure 110/80, SpO2 99% on room air.  Serum sodium is 136, potassium 3.6, chloride 101, bicarb 23, BUN of 9, serum creatinine 0.90, EGFR greater than 60, nonfasting blood glucose 125, WBC 9.3, hemoglobin 8.9, platelets of 656.  CT abdomen pelvis w contrast: Read as interval placement of a percutaneous right nephrostomy with pigtail tip in the region of ureteropelvic junction.  Interval progression of right hydronephrosis since prior CT.  Findings of right pyelonephritis and multiple perinephric collections adjacent to the superior pole of the right kidney, likely neuromas or abscesses.  ED treatment: Sodium chloride 1 L bolus, ceftriaxone 2 g IV one-time dose, oxycodone 5-325 mg p.o. one-time dose, Dilaudid 0.5 mg IV one-time dose.  EDP discussed with urology who states they will speak with IR.

## 2024-01-26 NOTE — ED Provider Notes (Signed)
 [facility] Provider Note    Event Date/Time   First MD Initiated Contact with Patient 01/26/24 [phone]     (approximate)   History   Post-op Problem   HPI  Bradlee ROSELIE CIRIGLIANO is a 63 y.o. female with a history of urolithiasis status post recent nephrostomy who presents with back pain, subjective fever, and cloudy urine.  The patient states that yesterday she was having subjective fevers on and off throughout the day.  She has had some increased low back pain on the right side.  She also noted some purulent material around the nephrostomy site.  The urine output from the nephrostomy has been cloudy, although she states that it has been this way since procedure.  Initially it was much darker.  Now it is light but still cloudy.  She denies any vomiting.  She states that her urine output is otherwise normal.  I reviewed the past medical records.  The patient was admitted to the hospitalist service from 3/19 through 3/25 with right flank pain, urinary frequency, and borderline fever.  She was diagnosed with multiple large ureteral stones in the right renal pelvis and ureter and subsequently acute pyelonephritis.  The patient had a right percutaneous nephrostomy tube placed by IR on 3/20.  She was discharged on Keflex.   Physical Exam   Triage Vital Signs: ED Triage Vitals  Encounter Vitals Group     BP 01/26/24 0918 110/80     Systolic BP Percentile --      Diastolic BP Percentile --      Pulse Rate 01/26/24 0918 97     Resp 01/26/24 0918 17     Temp 01/26/24 0918 97.9 F (36.6 C)     Temp src --      SpO2 01/26/24 0918 99 %     Weight 01/26/24 0918 138 lb 14.2 oz (63 kg)     Height 01/26/24 0918 5' 4.5" (1.638 m)     Head Circumference --      Peak Flow --      Pain Score 01/26/24 0919 10     Pain Loc --      Pain Education --      Exclude from Growth Chart --     Most recent vital signs: Vitals:   01/26/24 1030 01/26/24 1322  BP: 101/63   Pulse: 89    Resp: 17   Temp:  98.3 F (36.8 C)  SpO2: 99%      General: Alert, no distress.  CV:  Good peripheral perfusion.  Resp:  Normal effort.  Abd:  Soft and nontender.  No distention.  Other:  Mild right flank tenderness.  Nephrostomy tube in place with a small amount of dried discharge surrounding, but no purulent discharge expressed from the site.  No surrounding erythema, induration, or abnormal warmth.  Nephrostomy bag with light-colored, cloudy/opaque urine.   ED Results / Procedures / Treatments   Labs (all labs ordered are listed, but only abnormal results are displayed) Labs Reviewed  CBC - Abnormal; Notable for the following components:      Result Value   RBC 3.22 (*)    Hemoglobin 8.9 (*)    HCT 27.7 (*)    Platelets 656 (*)    All other components within normal limits  BASIC METABOLIC PANEL WITH GFR - Abnormal; Notable for the following components:   Glucose, Bld 125 (*)    All other components within normal limits  LACTIC ACID, PLASMA -  Abnormal; Notable for the following components:   Lactic Acid, Venous 2.4 (*)    All other components within normal limits  URINALYSIS, ROUTINE W REFLEX MICROSCOPIC - Abnormal; Notable for the following components:   Color, Urine YELLOW (*)    APPearance CLOUDY (*)    Specific Gravity, Urine 1.043 (*)    Protein, ur 30 (*)    Leukocytes,Ua LARGE (*)    Bacteria, UA RARE (*)    All other components within normal limits  LACTIC ACID, PLASMA     EKG     RADIOLOGY  CT abdomen/pelvis: I independently viewed and interpreted the images; the nephrostomy tube is visible in the right kidney and hydronephrosis appears to be increased from prior.  Radiology report indicates the following:  IMPRESSION:  1. Interval placement of a percutaneous right nephrostomy with  pigtail tip in the region of the ureteropelvic junction. Interval  progression of right hydronephrosis since the prior CT.  2. Findings of right pyelonephritis and  multiple perinephric  collections adjacent to the superior pole of the right kidney,  likely urinomas or abscesses.  3. Nonobstructing left renal calculi. No hydronephrosis on the left.  4. Cholelithiasis.  5. No bowel obstruction.    PROCEDURES:  Critical Care performed: No  Procedures   MEDICATIONS ORDERED IN ED: Medications  cefTRIAXone (ROCEPHIN) 2 g in sodium chloride 0.9 % 100 mL IVPB (2 g Intravenous New Bag/Given 01/26/24 1325)  oxyCODONE-acetaminophen (PERCOCET/ROXICET) 5-325 MG per tablet 1 tablet (1 tablet Oral Given 01/26/24 1013)  sodium chloride 0.9 % bolus 1,000 mL (1,000 mLs Intravenous New Bag/Given 01/26/24 1026)  iohexol (OMNIPAQUE) 300 MG/ML solution 100 mL (100 mLs Intravenous Contrast Given 01/26/24 1105)  HYDROmorphone (DILAUDID) injection 0.5 mg (0.5 mg Intravenous Given 01/26/24 1322)     IMPRESSION / MDM / ASSESSMENT AND PLAN / ED COURSE  I reviewed the triage vital signs and the nursing notes.  63 year old female with PMH as noted above and status post right nephrostomy 11 days ago presents with subjective fevers, back pain, and cloudy urine.  She is concerned that there may be an obstruction or persistent infection.  She has been taking Keflex since she was discharged.  Physical exam is overall reassuring.  Her vital signs are normal.  She has minimal tenderness to the right flank and no discharge expressed from the nephrostomy site or any surrounding skin infection.  Differential diagnosis includes, but is not limited to, persistent symptoms due to the nephrostomy and pyelonephritis, nephrostomy obstruction or other malfunction, worsening pyelonephritis, persistent renal obstruction due to stones, musculoskeletal pain, viral syndrome or other systemic etiology.  Patient's presentation is most consistent with acute complicated illness / injury requiring diagnostic workup.  We will obtain labs, urinalysis, CT and  reassess.  ----------------------------------------- 1:35 PM on 01/26/2024 -----------------------------------------  WBC count is normal but lactate is elevated.  UA shows greater than 50 WBCs.  CT shows worsened hydronephrosis, pyelonephritis, and possibly uromas versus abscesses.    I consulted Dr. Richardo Hanks and discussed the case with him; he agrees with IV antibiotics and admission and will evaluate and discuss with IR.  I then consulted Dr. Sedalia Muta from the hospitalist service; based on our discussion she agrees to evaluate the patient for admission.   FINAL CLINICAL IMPRESSION(S) / ED DIAGNOSES   Final diagnoses:  Pyelonephritis     Rx / DC Orders   ED Discharge Orders     None        Note:  This document was  prepared using Conservation officer, historic buildings and may include unintentional dictation errors.    Dionne Bucy, MD 01/26/24 [phone]

## 2024-01-26 NOTE — ED Notes (Signed)
 Critical Result: Lactic 2.4  Siadecki, MD aware

## 2024-01-26 NOTE — Assessment & Plan Note (Signed)
 Home famotidine 20 mg p.o. twice daily resumed

## 2024-01-26 NOTE — Assessment & Plan Note (Signed)
 Added urine culture Continue ceftriaxone 2 g IV daily, 7-day course ordered Urology has been consulted, Dr. Joselyn Glassman is aware

## 2024-01-26 NOTE — Assessment & Plan Note (Signed)
 In setting of nephrostomy tube placement Acetaminophen 650 mg p.o./rectal every 6 hours as needed for mild pain, fever; oxycodone 5 mg every 6 hours as needed for moderate pain, 2 days ordered Hydromorphone 0.5 mg IV every 4 hours as needed for severe pain, 1 day ordered AM team to reevaluate patient at bedside for continued IV pain medication requirements

## 2024-01-26 NOTE — ED Triage Notes (Signed)
 Pt comes with c/o post op problem. Pt stats she had image guided right percutaneous nephrostomy placement. Pt states she is now running fever and having lower back pain. Pt states she thinks he tubing might be clogged and infected.

## 2024-01-26 NOTE — Progress Notes (Signed)
 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  She underwent a right-sided nephrostomy tube placement at that time with interventional radiology and was discharged on Keflex.  She re-presented today and was readmitted with right-sided flank pain and subjective fever.  CT shows some persistent hydronephrosis in the right kidney and cystic changes versus possible abscess.  I discussed her imaging with interventional radiology who agrees the nephrostomy tube is in a good position.   -Agree with continuing antibiotics, would recommend changing to Bactrim or Cipro with improved renal parenchymal penetration -Per IR, nursing can flush nephrostomy tube every 8 hours -IR will consider pulling back nephrostomy tube if no improvement over the next 24 to 48 hours -Full consult note to follow  Legrand Rams, MD 01/26/2024

## 2024-01-26 NOTE — H&P (Signed)
 History and Physical   Belinda Oneill ZOX:[id number] DOB: [birth year] DOA: 01/26/2024  PCP: Pcp, No  Patient coming from: Home  I have personally briefly reviewed patient's old medical records in [facility] EMR.  Chief Concern: back pain and fever  HPI: Ms. Belinda Oneill is a 63 year old female with history of GERD, renal stones, recent percutaneous nephrostomy placement on 01/15/2024, who presents emergency department for chief concerns of back pain and fever at home.  Vitals in the ED showed temperature of 98.3, respiration rate 17, heart rate 97, blood pressure 110/80, SpO2 99% on room air.  Serum sodium is 136, potassium 3.6, chloride 101, bicarb 23, BUN of 9, serum creatinine 0.90, EGFR greater than 60, nonfasting blood glucose 125, WBC 9.3, hemoglobin 8.9, platelets of 656.  CT abdomen pelvis w contrast: Read as interval placement of a percutaneous right nephrostomy with pigtail tip in the region of ureteropelvic junction.  Interval progression of right hydronephrosis since prior CT.  Findings of right pyelonephritis and multiple perinephric collections adjacent to the superior pole of the right kidney, likely neuromas or abscesses.  ED treatment: Sodium chloride 1 L bolus, ceftriaxone 2 g IV one-time dose, oxycodone 5-325 mg p.o. one-time dose, Dilaudid 0.5 mg IV one-time dose.  EDP discussed with urology who states they will speak with IR. -------------------------------- At bedside, patient able to tell me her first and last name, age, location, current calendar year.  Patient reports that she started having cloudy nephrostomy urine drain and a fever last night.  She reports her fever was as high as 101 and she took a Tylenol which did improve her fever.  She denies trauma to her person.  She denies chest pain, dysuria, hematuria, diarrhea.  She endorses constipation.  Social history: She lives at home.  She currently smokes 4 to 5 cigarettes/day and is trying to quit.  She  infrequently drinks EtOH, last drink was 1 month ago.  She endorses occasional THC gummy use and denies IV drug use.  ROS: Constitutional: no weight change, + fever ENT/Mouth: no sore throat, no rhinorrhea Eyes: no eye pain, no vision changes Cardiovascular: no chest pain, no dyspnea,  no edema, no palpitations Respiratory: no cough, no sputum, no wheezing Gastrointestinal: no nausea, no vomiting, no diarrhea, no constipation Genitourinary: no urinary incontinence, no dysuria, no hematuria Musculoskeletal: no arthralgias, no myalgias Skin: no skin lesions, no pruritus, Neuro: + weakness, no loss of consciousness, no syncope Psych: no anxiety, no depression, + decrease appetite Heme/Lymph: no bruising, no bleeding  ED Course: Discussed with EDP, patient requiring hospitalization for chief concerns of right-sided pyelonephritis with possible abscess.  Assessment/Plan  Principal Problem:   Pyelonephritis Active Problems:   Thrombocytosis   Tobacco abuse   GERD without esophagitis   Hydronephrosis of right kidney   Back pain   Assessment and Plan:  * Pyelonephritis Added urine culture Continue ceftriaxone 2 g IV daily, 7-day course ordered Urology has been consulted, Dr. Joselyn Glassman is aware  Thrombocytosis Query reactive Last normal plt is 06/07/22. No labs between 06/07/2022 and 01/05/2024 Would recommend outpatient follow-up with hematology as primary thrombocytosis cannot be excluded at this time We will recheck CBC in a.m.  Tobacco abuse As needed nicotine patch ordered  Back pain In setting of nephrostomy tube placement Acetaminophen 650 mg p.o./rectal every 6 hours as needed for mild pain, fever; oxycodone 5 mg every 6 hours as needed for moderate pain, 2 days ordered Hydromorphone 0.5 mg IV every 4 hours as needed for  severe pain, 1 day ordered AM team to reevaluate patient at bedside for continued IV pain medication requirements  GERD without esophagitis Home  famotidine 20 mg p.o. twice daily resumed  Chart reviewed.   DVT prophylaxis: Heparin 5000 units subcutaneous every 8 hours Code Status: Full code Diet: Regular diet Family Communication: No Disposition Plan: Pending clinical course Consults called: Urology Admission status: Telemetry surgical, inpatient  Past Medical History:  Diagnosis Date   Essential hypertension 01/14/2024   Kidney stones    Kidney stones    Past Surgical History:  Procedure Laterality Date   IR NEPHROSTOMY PLACEMENT RIGHT  01/15/2024   LITHOTRIPSY     Social History:  reports that she has been smoking cigarettes. She has a 10 pack-year smoking history. She has never used smokeless tobacco. She reports current alcohol use. She reports that she does not use drugs.  No Known Allergies Family History  Problem Relation Age of Onset   Breast cancer Maternal Aunt    Family history: Family history reviewed and not pertinent.  Prior to Admission medications   Medication Sig Start Date End Date Taking? Authorizing Provider  acetaminophen (TYLENOL) 500 MG tablet Take 2 tablets (1,000 mg total) by mouth every 6 (six) hours as needed. 01/05/24 01/04/25  Gladys Damme, PA-C  cephALEXin (KEFLEX) 500 MG capsule Take 1 capsule (500 mg total) by mouth 4 (four) times daily for 9 days. 01/20/24 01/29/24  Marrion Coy, MD  famotidine (PEPCID) 20 MG tablet Take 1 tablet (20 mg total) by mouth 2 (two) times daily. 04/02/20 01/18/24  Miguel Aschoff., MD  iron polysaccharides (NIFEREX) 150 MG capsule Take 1 capsule (150 mg total) by mouth daily. 01/21/24   Marrion Coy, MD  oxyCODONE-acetaminophen (PERCOCET/ROXICET) 5-325 MG tablet Take 1-2 tablets by mouth every 4 (four) hours as needed for moderate pain (pain score 4-6). 01/20/24   Marrion Coy, MD  pantoprazole (PROTONIX) 40 MG tablet Take 1 tablet (40 mg total) by mouth daily. 08/09/19 08/08/20  Minna Antis, MD  senna-docusate (SENOKOT-S) 8.6-50 MG tablet Take 2 tablets by  mouth 2 (two) times daily as needed for mild constipation. 01/20/24   Marrion Coy, MD  tamsulosin (FLOMAX) 0.4 MG CAPS capsule Take 1 capsule (0.4 mg total) by mouth daily for 14 days. 01/20/24 02/03/24  Marrion Coy, MD   Physical Exam: Vitals:   01/26/24 1030 01/26/24 1322 01/26/24 1330 01/26/24 1443  BP: 101/63  120/84 120/73  Pulse: 89  81 87  Resp: 17  17 16   Temp:  98.3 F (36.8 C)  98.4 F (36.9 C)  TempSrc:  Oral  Oral  SpO2: 99%  100% 100%  Weight:      Height:       Constitutional: appears age-appropriate, NAD, calm Eyes: PERRL, lids and conjunctivae normal ENMT: Mucous membranes are moist. Posterior pharynx clear of any exudate or lesions. Age-appropriate dentition. Hearing appropriate Neck: normal, supple, no masses, no thyromegaly Respiratory: clear to auscultation bilaterally, no wheezing, no crackles. Normal respiratory effort. No accessory muscle use.  Cardiovascular: Regular rate and rhythm, no murmurs / rubs / gallops. No extremity edema. 2+ pedal pulses. No carotid bruits.  Abdomen: no tenderness, no masses palpated, no hepatosplenomegaly. Bowel sounds positive.  Nephrostomy tube in place in right back Musculoskeletal: no clubbing / cyanosis. No joint deformity upper and lower extremities. Good ROM, no contractures, no atrophy. Normal muscle tone.  Skin: no rashes, lesions, ulcers. No induration Neurologic: Sensation intact. Strength 5/5 in all 4.  Psychiatric: Normal  judgment and insight. Alert and oriented x 3.  Depressed mood.  Flat affect.  EKG: Not indicated at this time  Chest x-ray on Admission: Not indicated at this time  CT ABDOMEN PELVIS W CONTRAST Result Date: 01/26/2024 CLINICAL DATA:  History of kidney stone. Concern for pyelonephritis. EXAM: CT ABDOMEN AND PELVIS WITH CONTRAST TECHNIQUE: Multidetector CT imaging of the abdomen and pelvis was performed using the standard protocol following bolus administration of intravenous contrast. RADIATION DOSE  REDUCTION: This exam was performed according to the departmental dose-optimization program which includes automated exposure control, adjustment of the mA and/or kV according to patient size and/or use of iterative reconstruction technique. CONTRAST:  OMNIPAQUE IOHEXOL 300 MG/ML  SOLN COMPARISON:  CT abdomen pelvis dated 01/05/2024. FINDINGS: Lower chest: Bibasilar linear and patchy atelectasis. Pneumonia is not excluded. Small pericardial effusion measuring 4 mm in thickness. No intra-abdominal free air or free fluid. Hepatobiliary: The liver is unremarkable. No biliary dilatation. Layering stones in the gallbladder. No pericholecystic fluid or evidence of acute cholecystitis by CT. Pancreas: Unremarkable. No pancreatic ductal dilatation or surrounding inflammatory changes. Spleen: Normal in size without focal abnormality. Adrenals/Urinary Tract: The adrenal glands are unremarkable. Several nonobstructing left renal calculi measure up to 5 mm in the upper pole. No hydronephrosis on the left. Multiple stones in the proximal right ureter with the largest measuring approximately 12 mm in length. There has been interval placement of a percutaneous right nephrostomy with pigtail tip in the region of the ureteropelvic junction. Interval progression of right hydronephrosis since the prior CT. Several loculated perinephric collections along the upper pole of the right kidney suspicious for urinomas in the setting of caliceal rupture and possible superimposed infection. The largest loculated collection measures approximately 3 x 5 cm. There is slight haziness of the right renal parenchyma and mild perinephric stranding suspicious for pyelonephritis. The urinary bladder is grossly unremarkable. Stomach/Bowel: There is moderate stool throughout the colon. There is no bowel obstruction or active inflammation. The appendix is not visualized with certainty. No inflammatory changes identified in the right lower quadrant.  Vascular/Lymphatic: Moderate aortoiliac atherosclerotic disease. The IVC is unremarkable. No portal venous gas. There is no adenopathy. Reproductive: The uterus is retroverted and grossly unremarkable. No suspicious adnexal masses. Other: None Musculoskeletal: No acute or significant osseous findings. IMPRESSION: 1. Interval placement of a percutaneous right nephrostomy with pigtail tip in the region of the ureteropelvic junction. Interval progression of right hydronephrosis since the prior CT. 2. Findings of right pyelonephritis and multiple perinephric collections adjacent to the superior pole of the right kidney, likely urinomas or abscesses. 3. Nonobstructing left renal calculi. No hydronephrosis on the left. 4. Cholelithiasis. 5. No bowel obstruction. Electronically Signed   By: Elgie Collard M.D.   On: 01/26/2024 12:22   Labs on Admission: I have personally reviewed following labs  CBC: Recent Labs  Lab 01/20/24 0511 01/26/24 0920  WBC 9.7 9.3  HGB 8.2* 8.9*  HCT 24.7* 27.7*  MCV 83.7 86.0  PLT 634* 656*   Basic Metabolic Panel: Recent Labs  Lab 01/26/24 0920  NA 136  K 3.6  CL 101  CO2 23  GLUCOSE 125*  BUN 9  CREATININE 0.90  CALCIUM 9.3   GFR: Estimated Creatinine Clearance: 56.5 mL/min (by C-G formula based on SCr of 0.9 mg/dL).  Urine analysis:    Component Value Date/Time   COLORURINE YELLOW (A) 01/26/2024 1210   APPEARANCEUR CLOUDY (A) 01/26/2024 1210   APPEARANCEUR Clear 11/04/2013 0836   LABSPEC  1.043 (H) 01/26/2024 1210   LABSPEC 1.013 11/04/2013 0836   PHURINE 6.0 01/26/2024 1210   GLUCOSEU NEGATIVE 01/26/2024 1210   GLUCOSEU Negative 11/04/2013 0836   HGBUR NEGATIVE 01/26/2024 1210   BILIRUBINUR NEGATIVE 01/26/2024 1210   BILIRUBINUR Negative 11/04/2013 0836   KETONESUR NEGATIVE 01/26/2024 1210   PROTEINUR 30 (A) 01/26/2024 1210   NITRITE NEGATIVE 01/26/2024 1210   LEUKOCYTESUR LARGE (A) 01/26/2024 1210   LEUKOCYTESUR Negative 11/04/2013 0836    This document was prepared using Dragon Voice Recognition software and may include unintentional dictation errors.  Dr. Sedalia Muta [facility]  If 7PM-7AM, please contact overnight-coverage provider If 7AM-7PM, please contact day attending provider [email or web address]  01/26/2024, 3:14 PM

## 2024-01-26 NOTE — Assessment & Plan Note (Signed)
 Query reactive Last normal plt is 06/07/22. No labs between 06/07/2022 and 01/05/2024 Would recommend outpatient follow-up with hematology as primary thrombocytosis cannot be excluded at this time We will recheck CBC in a.m.

## 2024-01-27 DIAGNOSIS — N151 Renal and perinephric abscess: Secondary | ICD-10-CM

## 2024-01-27 DIAGNOSIS — N369 Urethral disorder, unspecified: Secondary | ICD-10-CM | POA: Diagnosis not present

## 2024-01-27 DIAGNOSIS — N12 Tubulo-interstitial nephritis, not specified as acute or chronic: Secondary | ICD-10-CM | POA: Diagnosis not present

## 2024-01-27 LAB — BASIC METABOLIC PANEL WITH GFR
Anion gap: 9 (ref 5–15)
BUN: 6 mg/dL — ABNORMAL LOW (ref 8–23)
CO2: 23 mmol/L (ref 22–32)
Calcium: 8.6 mg/dL — ABNORMAL LOW (ref 8.9–10.3)
Chloride: 100 mmol/L (ref 98–111)
Creatinine, Ser: 0.75 mg/dL (ref 0.44–1.00)
GFR, Estimated: >60 mL/min (ref >60)
Glucose, Bld: 102 mg/dL — ABNORMAL HIGH (ref 70–99)
Potassium: 4.2 mmol/L (ref 3.5–5.1)
Sodium: 132 mmol/L — ABNORMAL LOW (ref 135–145)

## 2024-01-27 LAB — URINE CULTURE: Culture: NO GROWTH

## 2024-01-27 LAB — CBC
HCT: 23.8 % — ABNORMAL LOW (ref 36.0–46.0)
Hemoglobin: 7.7 g/dL — ABNORMAL LOW (ref 12.0–15.0)
MCH: 27.3 pg (ref 26.0–34.0)
MCHC: 32.4 g/dL (ref 30.0–36.0)
MCV: 84.4 fL (ref 80.0–100.0)
Platelets: 595 10*3/uL — ABNORMAL HIGH (ref 150–400)
RBC: 2.82 MIL/uL — ABNORMAL LOW (ref 3.87–5.11)
RDW: 14.3 % (ref 11.5–15.5)
WBC: 8.9 10*3/uL (ref 4.0–10.5)
nRBC: 0 % (ref 0.0–0.2)

## 2024-01-27 MED ORDER — POLYSACCHARIDE IRON COMPLEX 150 MG PO CAPS
150.0000 mg | ORAL_CAPSULE | Freq: Every day | ORAL | Status: DC
  Administered 2024-01-27 – 2024-02-06 (×11): 150 mg via ORAL
  Filled 2024-01-27 (×11): qty 1

## 2024-01-27 NOTE — Plan of Care (Signed)

## 2024-01-27 NOTE — H&P (Signed)
 Referring Physician(s): Michiel Cowboy  Supervising Physician: Irish Lack  Patient Status:  [facility] - In-pt  Chief Complaint:  Right nephrolithiasis; right hydronephrosis - s/p image guided right percutaneous nephrostomy placement on 01/15/24   This is a 63 year old female with a PMH of HTN and kidney stones who is currently inpatient. She came to the ED initially on 3/10 where CT imaging revealed hydrolithiasis with hydronephrosis; she was discharged with pain management and f/u for urology. In the meantime, pt began to experience fevers, poor appetite, and increased R flank pain for which she returned to the ED on 3/19. She was hospitalized and IR was consulted for R PCN which was approved and placed 3/20 by Dr. Fredia Sorrow. She received IV abx and was discharged home.  On 01/26/24 she returned to the ED for fever despite being on oral abx at that time and lower back pain. She was admitted for pyelonephritis and back pain at site of PCN.    Subjective:  She endorses R flank pain today. Notes that pain improved slightly before being discharged but that she has not had any significant improvement in pain since PCN placement on 3/20. Urine in bag has changed from cloudy/bloody to milky urine but has never been clear. She verbalizes frustration with pain and ongoing abx use causing GI discomfort.   Allergies: Patient has no known allergies.  Medications: Prior to Admission medications   Medication Sig Start Date End Date Taking? Authorizing Provider  acetaminophen (TYLENOL) 500 MG tablet Take 2 tablets (1,000 mg total) by mouth every 6 (six) hours as needed. 01/05/24 01/04/25  Gladys Damme, PA-C  cephALEXin (KEFLEX) 500 MG capsule Take 1 capsule (500 mg total) by mouth 4 (four) times daily for 9 days. 01/20/24 01/29/24  Marrion Coy, MD  famotidine (PEPCID) 20 MG tablet Take 1 tablet (20 mg total) by mouth 2 (two) times daily. 04/02/20 01/18/24  Miguel Aschoff., MD  iron polysaccharides  (NIFEREX) 150 MG capsule Take 1 capsule (150 mg total) by mouth daily. 01/21/24   Marrion Coy, MD  oxyCODONE-acetaminophen (PERCOCET/ROXICET) 5-325 MG tablet Take 1-2 tablets by mouth every 4 (four) hours as needed for moderate pain (pain score 4-6). 01/20/24   Marrion Coy, MD  pantoprazole (PROTONIX) 40 MG tablet Take 1 tablet (40 mg total) by mouth daily. 08/09/19 08/08/20  Minna Antis, MD  senna-docusate (SENOKOT-S) 8.6-50 MG tablet Take 2 tablets by mouth 2 (two) times daily as needed for mild constipation. 01/20/24   Marrion Coy, MD  tamsulosin (FLOMAX) 0.4 MG CAPS capsule Take 1 capsule (0.4 mg total) by mouth daily for 14 days. 01/20/24 02/03/24  Marrion Coy, MD     Vital Signs: BP 110/62   Pulse 97   Temp 97.9 F (36.6 C)   Resp 16   Ht 5' 4.5" (1.638 m)   Wt 138 lb 14.2 oz (63 kg)   SpO2 99%   BMI 23.47 kg/m   Physical Exam Constitutional:      General: She is not in acute distress. Pulmonary:     Effort: Pulmonary effort is normal.  Genitourinary:    Comments: R PCN present and intact, milky drainage in collection bag, notable tenderness to entire R flank, dressing C/D/I Neurological:     Mental Status: She is alert.      Imaging: CT ABDOMEN PELVIS W CONTRAST Result Date: 01/26/2024 CLINICAL DATA:  History of kidney stone. Concern for pyelonephritis. EXAM: CT ABDOMEN AND PELVIS WITH CONTRAST TECHNIQUE: Multidetector CT imaging  of the abdomen and pelvis was performed using the standard protocol following bolus administration of intravenous contrast. RADIATION DOSE REDUCTION: This exam was performed according to the departmental dose-optimization program which includes automated exposure control, adjustment of the mA and/or kV according to patient size and/or use of iterative reconstruction technique. CONTRAST:  OMNIPAQUE IOHEXOL 300 MG/ML  SOLN COMPARISON:  CT abdomen pelvis dated 01/05/2024. FINDINGS: Lower chest: Bibasilar linear and patchy atelectasis.  Pneumonia is not excluded. Small pericardial effusion measuring 4 mm in thickness. No intra-abdominal free air or free fluid. Hepatobiliary: The liver is unremarkable. No biliary dilatation. Layering stones in the gallbladder. No pericholecystic fluid or evidence of acute cholecystitis by CT. Pancreas: Unremarkable. No pancreatic ductal dilatation or surrounding inflammatory changes. Spleen: Normal in size without focal abnormality. Adrenals/Urinary Tract: The adrenal glands are unremarkable. Several nonobstructing left renal calculi measure up to 5 mm in the upper pole. No hydronephrosis on the left. Multiple stones in the proximal right ureter with the largest measuring approximately 12 mm in length. There has been interval placement of a percutaneous right nephrostomy with pigtail tip in the region of the ureteropelvic junction. Interval progression of right hydronephrosis since the prior CT. Several loculated perinephric collections along the upper pole of the right kidney suspicious for urinomas in the setting of caliceal rupture and possible superimposed infection. The largest loculated collection measures approximately 3 x 5 cm. There is slight haziness of the right renal parenchyma and mild perinephric stranding suspicious for pyelonephritis. The urinary bladder is grossly unremarkable. Stomach/Bowel: There is moderate stool throughout the colon. There is no bowel obstruction or active inflammation. The appendix is not visualized with certainty. No inflammatory changes identified in the right lower quadrant. Vascular/Lymphatic: Moderate aortoiliac atherosclerotic disease. The IVC is unremarkable. No portal venous gas. There is no adenopathy. Reproductive: The uterus is retroverted and grossly unremarkable. No suspicious adnexal masses. Other: None Musculoskeletal: No acute or significant osseous findings. IMPRESSION: 1. Interval placement of a percutaneous right nephrostomy with pigtail tip in the region of  the ureteropelvic junction. Interval progression of right hydronephrosis since the prior CT. 2. Findings of right pyelonephritis and multiple perinephric collections adjacent to the superior pole of the right kidney, likely urinomas or abscesses. 3. Nonobstructing left renal calculi. No hydronephrosis on the left. 4. Cholelithiasis. 5. No bowel obstruction. Electronically Signed   By: Elgie Collard M.D.   On: 01/26/2024 12:22    Labs:  CBC: Recent Labs    01/19/24 0523 01/20/24 0511 01/26/24 0920 01/27/24 0349  WBC 11.3* 9.7 9.3 8.9  HGB 7.7* 8.2* 8.9* 7.7*  HCT 23.2* 24.7* 27.7* 23.8*  PLT 576* 634* 656* 595*    COAGS: Recent Labs    01/15/24 0843  INR 1.3*  APTT 43*    BMP: Recent Labs    01/16/24 0501 01/19/24 0523 01/26/24 0920 01/27/24 0349  NA 133* 134* 136 132*  K 4.0 3.9 3.6 4.2  CL 103 99 101 100  CO2 23 25 23 23   GLUCOSE 108* 99 125* 102*  BUN 6* 7* 9 6*  CALCIUM 8.4* 9.0 9.3 8.6*  CREATININE 0.82 0.78 0.90 0.75  GFRNONAA >60 >60 >60 >60    LIVER FUNCTION TESTS: Recent Labs    01/05/24 1608 01/14/24 1549  BILITOT 0.6 0.6  AST 23 22  ALT 27 27  ALKPHOS 75 93  PROT 8.1 8.5*  ALBUMIN 3.1* 2.7*    Assessment and Plan:  S/p R PCN placement 3/20 - continued pain and  purulent output into collection bag and new fever prompting admission  - WBC improved from time of insertion, now WNL - CT 3/31 shows progression of hydronephrosis and perinephric fluid collections adjacent to superior pole of R kidney - discussion between urology and Dr. Fredia Sorrow, suspected R upper pole not draining as well as lower pole, will tentatively plan for nephrogram this week, retraction of the PCN based on findings from nephrogram to see if this helps with drainage of upper pole.  - continued abx managed by IP team  Electronically Signed: Carlton Adam, NP 01/27/2024, 4:19 PM   I spent a total of 25 Minutes at the the patient's bedside AND on the patient's  hospital floor or unit, greater than 50% of which was counseling/coordinating care for s/p

## 2024-01-27 NOTE — Consult Note (Signed)
 Urology Consult  I have been asked to see the patient by Dr. Sedalia Muta, for evaluation and management of right pyelonephritis and possible abscess s/p right PCN.  Chief Complaint: Subjective fever, right low back pain  History of Present Illness: Belinda Oneill is a 63 y.o. year old female admitted last month with multiple large obstructing proximal right renal pelvis and ureteral stones s/p right nephrostomy tube placement with IR on 01/15/2024 and concern for XGP kidney who returned to the ED yesterday with reports of subjective fevers and right low back pain as well as ongoing cloudy urine.  CTAP with contrast yesterday showed interval progression of right hydronephrosis with multiple fluid collections in the superior right kidney concerning for urinomas versus abscesses, overall suspicious for right pyelonephritis.  Nephrostomy tube is in place with the tip at the right UPJ.  Admission labs notable for white count 9.3; hemoglobin stable at 8.9; creatinine 0.90 ( 0.8); lactate 2.4; and UA with >50 RBC/hpf, >50 WBC/hpf, rare bacteria, and WBC clumps.  Urine culture pending, on antibiotics as below.  Urine culture during her most recent admission grew Macrobid resistant Proteus mirabilis.  This morning, white count slightly down, 8.9.  Creatinine down, 0.75.  She was febrile overnight, Tmax 38.3 C.  BP soft this morning.  She is accompanied today by her sister-in-law at the bedside.  She reports she feels stable compared to admission.  She is frustrated with the duration of her illness.  Right nephrostomy tube in place draining cloudy, yellow urine.  Anti-infectives (From admission, onward)    Start     Dose/Rate Route Frequency Ordered Stop   01/27/24 1000  cefTRIAXone (ROCEPHIN) 2 g in sodium chloride 0.9 % 100 mL IVPB        2 g 200 mL/hr over 30 Minutes Intravenous Every 24 hours 01/26/24 1512 02/02/24 0959   01/26/24 1315  cefTRIAXone (ROCEPHIN) 2 g in sodium chloride 0.9 % 100 mL IVPB         2 g 200 mL/hr over 30 Minutes Intravenous  Once 01/26/24 1307 01/26/24 1358       Past Medical History:  Diagnosis Date   Essential hypertension 01/14/2024   Kidney stones    Kidney stones     Past Surgical History:  Procedure Laterality Date   IR NEPHROSTOMY PLACEMENT RIGHT  01/15/2024   LITHOTRIPSY      Home Medications:  No outpatient medications have been marked as taking for the 01/26/24 encounter [facility] Encounter).    Allergies: No Known Allergies  Family History  Problem Relation Age of Onset   Breast cancer Maternal Aunt     Social History:  reports that she has been smoking cigarettes. She has a 10 pack-year smoking history. She has never used smokeless tobacco. She reports current alcohol use. She reports that she does not use drugs.  ROS: A complete review of systems was performed.  All systems are negative except for pertinent findings as noted.  Physical Exam:  Vital signs in last 24 hours: Temp:  [98.1 F (36.7 C)-101 F (38.3 C)] 98.1 F (36.7 C) (04/01 0722) Pulse Rate:  [81-109] 88 (04/01 0723) Resp:  [16-17] 16 (04/01 0722) BP: (91-122)/(65-91) 98/65 (04/01 0723) SpO2:  [93 %-100 %] 93 % (04/01 0722) Constitutional:  Alert and oriented, no acute distress HEENT: [place] AT, moist mucus membranes Cardiovascular: No clubbing, cyanosis, or edema Respiratory: Normal respiratory effort Skin: No rashes, bruises or suspicious lesions Neurologic: Grossly intact, no focal deficits, moving all  4 extremities Psychiatric: Normal mood and affect  Laboratory Data:  Recent Labs    01/26/24 0920 01/27/24 0349  WBC 9.3 8.9  HGB 8.9* 7.7*  HCT 27.7* 23.8*   Recent Labs    01/26/24 0920 01/27/24 0349  NA 136 132*  K 3.6 4.2  CL 101 100  CO2 23 23  GLUCOSE 125* 102*  BUN 9 6*  CREATININE 0.90 0.75  CALCIUM 9.3 8.6*   Urinalysis    Component Value Date/Time   COLORURINE YELLOW (A) 01/26/2024 1210   COLORURINE YELLOW (A) 01/26/2024 1210    APPEARANCEUR CLOUDY (A) 01/26/2024 1210   APPEARANCEUR CLOUDY (A) 01/26/2024 1210   APPEARANCEUR Clear 11/04/2013 0836   LABSPEC 1.043 (H) 01/26/2024 1210   LABSPEC 1.043 (H) 01/26/2024 1210   LABSPEC 1.013 11/04/2013 0836   PHURINE 6.0 01/26/2024 1210   PHURINE 6.0 01/26/2024 1210   GLUCOSEU NEGATIVE 01/26/2024 1210   GLUCOSEU NEGATIVE 01/26/2024 1210   GLUCOSEU Negative 11/04/2013 0836   HGBUR NEGATIVE 01/26/2024 1210   HGBUR NEGATIVE 01/26/2024 1210   BILIRUBINUR NEGATIVE 01/26/2024 1210   BILIRUBINUR NEGATIVE 01/26/2024 1210   BILIRUBINUR Negative 11/04/2013 0836   KETONESUR NEGATIVE 01/26/2024 1210   KETONESUR NEGATIVE 01/26/2024 1210   PROTEINUR 30 (A) 01/26/2024 1210   PROTEINUR 30 (A) 01/26/2024 1210   NITRITE NEGATIVE 01/26/2024 1210   NITRITE NEGATIVE 01/26/2024 1210   LEUKOCYTESUR LARGE (A) 01/26/2024 1210   LEUKOCYTESUR LARGE (A) 01/26/2024 1210   LEUKOCYTESUR Negative 11/04/2013 0836   Results for orders placed or performed during the hospital encounter of 01/14/24  Urine Culture     Status: Abnormal   Collection Time: 01/14/24  3:49 PM   Specimen: Urine, Random  Result Value Ref Range Status   Specimen Description   Final    URINE, RANDOM Performed at [facility], [address] [place], [place] [address]    Special Requests   Final    NONE Reflexed from [id number] Performed at [facility], [address] [place], [place] [address]    Culture >=100,000 COLONIES/mL PROTEUS MIRABILIS (A)  Final   Report Status 01/16/2024 FINAL  Final   Organism ID, Bacteria PROTEUS MIRABILIS (A)  Final      Susceptibility   Proteus mirabilis - MIC*    AMPICILLIN <=2 SENSITIVE Sensitive     CEFAZOLIN <=4 SENSITIVE Sensitive     CEFEPIME <=0.12 SENSITIVE Sensitive     CEFTRIAXONE <=0.25 SENSITIVE Sensitive     CIPROFLOXACIN <=0.25 SENSITIVE Sensitive     GENTAMICIN <=1 SENSITIVE Sensitive     IMIPENEM 2 SENSITIVE Sensitive     NITROFURANTOIN 128  RESISTANT Resistant     TRIMETH/SULFA <=20 SENSITIVE Sensitive     AMPICILLIN/SULBACTAM <=2 SENSITIVE Sensitive     PIP/TAZO <=4 SENSITIVE Sensitive ug/mL    * >=100,000 COLONIES/mL PROTEUS MIRABILIS  Urine Culture (for pregnant, neutropenic or urologic patients or patients with an indwelling urinary catheter)     Status: Abnormal   Collection Time: 01/15/24  3:18 PM   Specimen: Kidney; Urine  Result Value Ref Range Status   Specimen Description   Final    KIDNEY Performed at [facility], [address], [place], [place] [address]    Special Requests   Final    URINE ASPIRATE FROM RIGHT KIDNEY DURING PROCEDURE Performed at [facility], [address] [place], [place] [address]    Culture 60,000 COLONIES/mL PROTEUS MIRABILIS (A)  Final   Report Status 01/18/2024 FINAL  Final   Organism ID, Bacteria PROTEUS MIRABILIS (A)  Final      Susceptibility   Proteus mirabilis - MIC*    AMPICILLIN <=2 SENSITIVE Sensitive     CEFEPIME <=0.12 SENSITIVE Sensitive     CEFTAZIDIME <=1 SENSITIVE Sensitive     CEFTRIAXONE <=0.25 SENSITIVE Sensitive     CIPROFLOXACIN <=0.25 SENSITIVE Sensitive     GENTAMICIN <=1 SENSITIVE Sensitive     IMIPENEM 2 SENSITIVE Sensitive     TRIMETH/SULFA <=20 SENSITIVE Sensitive     AMPICILLIN/SULBACTAM <=2 SENSITIVE Sensitive     PIP/TAZO <=4 SENSITIVE Sensitive ug/mL    * 60,000 COLONIES/mL PROTEUS MIRABILIS    Radiologic Imaging: CT ABDOMEN PELVIS W CONTRAST Result Date: 01/26/2024 CLINICAL DATA:  History of kidney stone. Concern for pyelonephritis. EXAM: CT ABDOMEN AND PELVIS WITH CONTRAST TECHNIQUE: Multidetector CT imaging of the abdomen and pelvis was performed using the standard protocol following bolus administration of intravenous contrast. RADIATION DOSE REDUCTION: This exam was performed according to the departmental dose-optimization program which includes automated exposure control, adjustment of the mA and/or kV according to patient  size and/or use of iterative reconstruction technique. CONTRAST:  OMNIPAQUE IOHEXOL 300 MG/ML  SOLN COMPARISON:  CT abdomen pelvis dated 01/05/2024. FINDINGS: Lower chest: Bibasilar linear and patchy atelectasis. Pneumonia is not excluded. Small pericardial effusion measuring 4 mm in thickness. No intra-abdominal free air or free fluid. Hepatobiliary: The liver is unremarkable. No biliary dilatation. Layering stones in the gallbladder. No pericholecystic fluid or evidence of acute cholecystitis by CT. Pancreas: Unremarkable. No pancreatic ductal dilatation or surrounding inflammatory changes. Spleen: Normal in size without focal abnormality. Adrenals/Urinary Tract: The adrenal glands are unremarkable. Several nonobstructing left renal calculi measure up to 5 mm in the upper pole. No hydronephrosis on the left. Multiple stones in the proximal right ureter with the largest measuring approximately 12 mm in length. There has been interval placement of a percutaneous right nephrostomy with pigtail tip in the region of the ureteropelvic junction. Interval progression of right hydronephrosis since the prior CT. Several loculated perinephric collections along the upper pole of the right kidney suspicious for urinomas in the setting of caliceal rupture and possible superimposed infection. The largest loculated collection measures approximately 3 x 5 cm. There is slight haziness of the right renal parenchyma and mild perinephric stranding suspicious for pyelonephritis. The urinary bladder is grossly unremarkable. Stomach/Bowel: There is moderate stool throughout the colon. There is no bowel obstruction or active inflammation. The appendix is not visualized with certainty. No inflammatory changes identified in the right lower quadrant. Vascular/Lymphatic: Moderate aortoiliac atherosclerotic disease. The IVC is unremarkable. No portal venous gas. There is no adenopathy. Reproductive: The uterus is retroverted and grossly  unremarkable. No suspicious adnexal masses. Other: None Musculoskeletal: No acute or significant osseous findings. IMPRESSION: 1. Interval placement of a percutaneous right nephrostomy with pigtail tip in the region of the ureteropelvic junction. Interval progression of right hydronephrosis since the prior CT. 2. Findings of right pyelonephritis and multiple perinephric collections adjacent to the superior pole of the right kidney, likely urinomas or abscesses. 3. Nonobstructing left renal calculi. No hydronephrosis on the left. 4. Cholelithiasis. 5. No bowel obstruction. Electronically Signed   By: Elgie Collard M.D.   On: 01/26/2024 12:22   Assessment & Plan:  63 year old female s/p right nephrostomy tube placement for management of right hydronephrosis due to obstructing renal pelvis/proximal right ureteral stones with pyelonephritis now readmitted with recurrent versus persistent right pyelonephritis with urinoma versus abscess formation  in the right upper pole.  White count and creatinine have improved overnight.  She remains intermittently febrile, not atypical in the setting of pyelonephritis.  We discussed continued IV antibiotics and supportive care.  If she fails to significantly improve within 48 hours, will reengage IR for consideration of repositioning the nephrostomy tube.  Recommendations: -Continue antibiotics, ultimately transition to Bactrim or Cipro for increased renal parenchymal penetration -Flush NT every 8 hours -Pain control, supportive care -Trend BMP, CBC -Consider IR reinvolvement if not clinically improving by Thursday  Thank you for involving me in this patient's care, I will continue to follow along.  Carman Ching, PA-C 01/27/2024 12:11 PM

## 2024-01-27 NOTE — Progress Notes (Signed)
 Progress Note    Belinda Oneill  ZOX:[id number] DOB: [birth year]  DOA: 01/26/2024 PCP: Pcp, No      Brief Narrative:    Medical records reviewed and are as summarized below:  Belinda Oneill is a 63 y.o. female with history of GERD, renal stones, recent percutaneous nephrostomy placement on 01/15/2024, being set discharged from the hospital on 01/20/2024 after hospitalization for pyelonephritis secondary to Proteus mirabilis and acute unilateral obstructive uropathy due to kidney stone.  She was discharged on Keflex to complete 2-week course of antibiotics. She presented to the hospital again on 01/26/2024 because of back pain and fever.  She was readmitted to the hospital for pyelonephritis.    Assessment/Plan:   Principal Problem:   Pyelonephritis Active Problems:   Thrombocytosis   Tobacco abuse   GERD without esophagitis   Hydronephrosis of right kidney   Back pain    Body mass index is 23.47 kg/m.   Acute right pyelonephritis: Continue empiric IV ceftriaxone.  Analgesia is as needed for pain (Tylenol, oxycodone and IV Dilaudid).  Follow-up urine culture. Recent urine culture from 01/15/2024 showed Proteus mirabilis which was pansensitive.   Persistent right large hydronephrosis, suspected urinomas or abscesses on CT abdomen and pelvis, nonobstructing left renal calculi: Right nephrostomy tube in place (placed on 01/15/2024 prior to admission).  Follow-up with urologist.   Hyponatremia: Fluctuating but stable sodium levels.   Thrombocytosis: Probably reactive.  Monitor CBC.   Comorbidities include GERD, tobacco use disorder, chronic anemia      Diet Order             Diet regular Room service appropriate? Yes; Fluid consistency: Thin  Diet effective now                            Consultants: Urologist  Procedures: None    Medications:    famotidine  20 mg Oral BID   heparin  5,000 Units Subcutaneous Q8H   senna  1 tablet  Oral BID   Continuous Infusions:  cefTRIAXone (ROCEPHIN)  IV 2 g (01/27/24 0942)   lactated ringers 125 mL/hr at 01/27/24 0939     Anti-infectives (From admission, onward)    Start     Dose/Rate Route Frequency Ordered Stop   01/27/24 1000  cefTRIAXone (ROCEPHIN) 2 g in sodium chloride 0.9 % 100 mL IVPB        2 g 200 mL/hr over 30 Minutes Intravenous Every 24 hours 01/26/24 1512 02/02/24 0959   01/26/24 1315  cefTRIAXone (ROCEPHIN) 2 g in sodium chloride 0.9 % 100 mL IVPB        2 g 200 mL/hr over 30 Minutes Intravenous  Once 01/26/24 1307 01/26/24 1358              Family Communication/Anticipated D/C date and plan/Code Status   DVT prophylaxis: heparin injection 5,000 Units Start: 01/27/24 2200 Place TED hose Start: 01/26/24 1339     Code Status: Full Code  Family Communication: None Disposition Plan: Plan to discharge home   Status is: Inpatient Remains inpatient appropriate because: Pyelonephritis       Subjective:   Interval events noted.  She complains of right flank pain.  Objective:    Vitals:   01/27/24 0320 01/27/24 0340 01/27/24 0722 01/27/24 0723  BP: (!) 122/91  91/73 98/65  Pulse: (!) 109  91 88  Resp: 16  16   Temp: (!) 101 F (38.3  C) 98.6 F (37 C) 98.1 F (36.7 C)   TempSrc: Oral     SpO2: 96%  93%   Weight:      Height:       No data found.   Intake/Output Summary (Last 24 hours) at 01/27/2024 1214 Last data filed at 01/27/2024 0500 Gross per 24 hour  Intake 1099.96 ml  Output 220 ml  Net 879.96 ml   Filed Weights   01/26/24 0918  Weight: 63 kg    Exam:  GEN: NAD SKIN: Warm and dry EYES: No pallor or icterus ENT: MMM CV: RRR PULM: CTA B ABD: soft, ND, NT, +BS CNS: AAO x 3, non focal EXT: No edema or tenderness GU: Right CVA tenderness.  Right nephrostomy tube with cloudy urine        Data Reviewed:   I have personally reviewed following labs and imaging studies:  Labs: Labs show the following:    Basic Metabolic Panel: Recent Labs  Lab 01/26/24 0920 01/27/24 0349  NA 136 132*  K 3.6 4.2  CL 101 100  CO2 23 23  GLUCOSE 125* 102*  BUN 9 6*  CREATININE 0.90 0.75  CALCIUM 9.3 8.6*   GFR Estimated Creatinine Clearance: 63.5 mL/min (by C-G formula based on SCr of 0.75 mg/dL). Liver Function Tests: No results for input(s): "AST", "ALT", "ALKPHOS", "BILITOT", "PROT", "ALBUMIN" in the last 168 hours. No results for input(s): "LIPASE", "AMYLASE" in the last 168 hours. No results for input(s): "AMMONIA" in the last 168 hours. Coagulation profile No results for input(s): "INR", "PROTIME" in the last 168 hours.  CBC: Recent Labs  Lab 01/26/24 0920 01/27/24 0349  WBC 9.3 8.9  HGB 8.9* 7.7*  HCT 27.7* 23.8*  MCV 86.0 84.4  PLT 656* 595*   Cardiac Enzymes: No results for input(s): "CKTOTAL", "CKMB", "CKMBINDEX", "TROPONINI" in the last 168 hours. BNP (last 3 results) No results for input(s): "PROBNP" in the last 8760 hours. CBG: No results for input(s): "GLUCAP" in the last 168 hours. D-Dimer: No results for input(s): "DDIMER" in the last 72 hours. Hgb A1c: No results for input(s): "HGBA1C" in the last 72 hours. Lipid Profile: No results for input(s): "CHOL", "HDL", "LDLCALC", "TRIG", "CHOLHDL", "LDLDIRECT" in the last 72 hours. Thyroid function studies: No results for input(s): "TSH", "T4TOTAL", "T3FREE", "THYROIDAB" in the last 72 hours.  Invalid input(s): "FREET3" Anemia work up: No results for input(s): "VITAMINB12", "FOLATE", "FERRITIN", "TIBC", "IRON", "RETICCTPCT" in the last 72 hours. Sepsis Labs: Recent Labs  Lab 01/26/24 0920 01/26/24 1120 01/27/24 0349  WBC 9.3  --  8.9  LATICACIDVEN 2.4* 1.0  --     Microbiology No results found for this or any previous visit (from the past 240 hours).  Procedures and diagnostic studies:  CT ABDOMEN PELVIS W CONTRAST Result Date: 01/26/2024 CLINICAL DATA:  History of kidney stone. Concern for pyelonephritis.  EXAM: CT ABDOMEN AND PELVIS WITH CONTRAST TECHNIQUE: Multidetector CT imaging of the abdomen and pelvis was performed using the standard protocol following bolus administration of intravenous contrast. RADIATION DOSE REDUCTION: This exam was performed according to the departmental dose-optimization program which includes automated exposure control, adjustment of the mA and/or kV according to patient size and/or use of iterative reconstruction technique. CONTRAST:  OMNIPAQUE IOHEXOL 300 MG/ML  SOLN COMPARISON:  CT abdomen pelvis dated 01/05/2024. FINDINGS: Lower chest: Bibasilar linear and patchy atelectasis. Pneumonia is not excluded. Small pericardial effusion measuring 4 mm in thickness. No intra-abdominal free air or free fluid. Hepatobiliary: The  liver is unremarkable. No biliary dilatation. Layering stones in the gallbladder. No pericholecystic fluid or evidence of acute cholecystitis by CT. Pancreas: Unremarkable. No pancreatic ductal dilatation or surrounding inflammatory changes. Spleen: Normal in size without focal abnormality. Adrenals/Urinary Tract: The adrenal glands are unremarkable. Several nonobstructing left renal calculi measure up to 5 mm in the upper pole. No hydronephrosis on the left. Multiple stones in the proximal right ureter with the largest measuring approximately 12 mm in length. There has been interval placement of a percutaneous right nephrostomy with pigtail tip in the region of the ureteropelvic junction. Interval progression of right hydronephrosis since the prior CT. Several loculated perinephric collections along the upper pole of the right kidney suspicious for urinomas in the setting of caliceal rupture and possible superimposed infection. The largest loculated collection measures approximately 3 x 5 cm. There is slight haziness of the right renal parenchyma and mild perinephric stranding suspicious for pyelonephritis. The urinary bladder is grossly unremarkable.  Stomach/Bowel: There is moderate stool throughout the colon. There is no bowel obstruction or active inflammation. The appendix is not visualized with certainty. No inflammatory changes identified in the right lower quadrant. Vascular/Lymphatic: Moderate aortoiliac atherosclerotic disease. The IVC is unremarkable. No portal venous gas. There is no adenopathy. Reproductive: The uterus is retroverted and grossly unremarkable. No suspicious adnexal masses. Other: None Musculoskeletal: No acute or significant osseous findings. IMPRESSION: 1. Interval placement of a percutaneous right nephrostomy with pigtail tip in the region of the ureteropelvic junction. Interval progression of right hydronephrosis since the prior CT. 2. Findings of right pyelonephritis and multiple perinephric collections adjacent to the superior pole of the right kidney, likely urinomas or abscesses. 3. Nonobstructing left renal calculi. No hydronephrosis on the left. 4. Cholelithiasis. 5. No bowel obstruction. Electronically Signed   By: Elgie Collard M.D.   On: 01/26/2024 12:22               LOS: 1 day   Raquell Richer  [facility]   Pager on [email or web address]. If 7PM-7AM, please contact night-coverage at [email or web address]     01/27/2024, 12:14 PM

## 2024-01-28 ENCOUNTER — Inpatient Hospital Stay: Payer: Self-pay | Admitting: Radiology

## 2024-01-28 DIAGNOSIS — E871 Hypo-osmolality and hyponatremia: Secondary | ICD-10-CM

## 2024-01-28 DIAGNOSIS — D649 Anemia, unspecified: Secondary | ICD-10-CM

## 2024-01-28 DIAGNOSIS — N1330 Unspecified hydronephrosis: Secondary | ICD-10-CM

## 2024-01-28 DIAGNOSIS — Z720 Tobacco use: Secondary | ICD-10-CM

## 2024-01-28 DIAGNOSIS — D75839 Thrombocytosis, unspecified: Secondary | ICD-10-CM

## 2024-01-28 DIAGNOSIS — N12 Tubulo-interstitial nephritis, not specified as acute or chronic: Secondary | ICD-10-CM | POA: Diagnosis not present

## 2024-01-28 HISTORY — PX: IR NEPHROSTOMY EXCHANGE RIGHT: IMG6070

## 2024-01-28 MED ORDER — ENSURE ENLIVE PO LIQD
237.0000 mL | Freq: Two times a day (BID) | ORAL | Status: DC
  Administered 2024-01-29 – 2024-02-06 (×15): 237 mL via ORAL

## 2024-01-28 MED ORDER — MELATONIN 5 MG PO TABS
5.0000 mg | ORAL_TABLET | Freq: Every day | ORAL | Status: DC
Start: 2024-01-28 — End: 2024-01-31
  Administered 2024-01-28 – 2024-01-30 (×3): 5 mg via ORAL
  Filled 2024-01-28 (×3): qty 1

## 2024-01-28 MED ORDER — OXYCODONE-ACETAMINOPHEN 5-325 MG PO TABS
2.0000 | ORAL_TABLET | Freq: Four times a day (QID) | ORAL | Status: DC | PRN
  Administered 2024-01-28 – 2024-01-31 (×11): 2 via ORAL
  Filled 2024-01-28 (×13): qty 2

## 2024-01-28 MED ORDER — IOHEXOL 300 MG/ML  SOLN
40.0000 mL | Freq: Once | INTRAMUSCULAR | Status: AC | PRN
  Administered 2024-01-28: 40 mL

## 2024-01-28 MED ORDER — HYDROMORPHONE HCL 1 MG/ML IJ SOLN
0.5000 mg | INTRAMUSCULAR | Status: DC | PRN
  Administered 2024-01-28 – 2024-02-02 (×19): 0.5 mg via INTRAVENOUS
  Filled 2024-01-28 (×20): qty 0.5

## 2024-01-28 MED ORDER — LIDOCAINE HCL 1 % IJ SOLN
8.0000 mL | Freq: Once | INTRAMUSCULAR | Status: AC
  Administered 2024-01-28: 8 mL via INTRADERMAL
  Filled 2024-01-28: qty 8

## 2024-01-28 MED ORDER — SODIUM CHLORIDE 0.9% FLUSH
5.0000 mL | Freq: Three times a day (TID) | INTRAVENOUS | Status: DC
  Administered 2024-01-28 – 2024-02-03 (×15): 5 mL

## 2024-01-28 MED ORDER — LIDOCAINE HCL 1 % IJ SOLN
INTRAMUSCULAR | Status: AC
  Filled 2024-01-28: qty 20

## 2024-01-28 NOTE — TOC Initial Note (Signed)
 Transition of Care [facility]) - Initial/Assessment Note    Patient Details  Name: Belinda Oneill MRN: [id number] Date of Birth: [birth year]  Transition of Care [facility]) CM/SW Contact:    Chapman Fitch, RN Phone Number: 01/28/2024, 10:24 AM  Clinical Narrative:                    Patient was assessed by this TOC member on 3/24 previous admission.  See note from that admission below   "Admitted XBM:[id number] unilateral obstructive uropathy s/p nephrostomy tube placement Admitted from: home with mother PCP: Patient was previously seen by Dr Nemiah Commander, however after losing her insurance is no longer being seen by her.  [facility]  information added to AVS Pharmacy: Pharmacy updated to [facility].  Plan for meds to bed at discharge. Pharmacist aware"   [facility] information added to AVS again. Pharmacy updated to [facility] for discharge medications      Patient Goals and CMS Choice            Expected Discharge Plan and Services                                              Prior Living Arrangements/Services                       Activities of Daily Living   ADL Screening (condition at time of admission) Independently performs ADLs?: Yes (appropriate for developmental age) Is the patient deaf or have difficulty hearing?: No Does the patient have difficulty seeing, even when wearing glasses/contacts?: No Does the patient have difficulty concentrating, remembering, or making decisions?: No  Permission Sought/Granted                  Emotional Assessment              Admission diagnosis:  Pyelonephritis [N12] Patient Active Problem List   Diagnosis Date Noted   Pyelonephritis 01/26/2024   Thrombocytosis 01/26/2024   Hydronephrosis of right kidney 01/26/2024   Back pain 01/26/2024   Reactive thrombocytosis 01/15/2024   Acute unilateral obstructive uropathy 01/14/2024   GERD without esophagitis 01/14/2024   Elevated blood pressure reading  01/14/2024   Tobacco abuse 01/14/2024   Acute lower UTI 01/14/2024   PCP:  Oneita Hurt, No Pharmacy:   [facility] [address] ([address]),  - [address] [place]) [place] [address] Phone: [phone] Fax: [phone]  [facility] [address] [place] [address] Phone: [phone] Fax: [phone]     Social Drivers of Health (SDOH) Social History: SDOH Screenings   Food Insecurity: No Food Insecurity (01/26/2024)  Housing: Low Risk  (01/26/2024)  Transportation Needs: No Transportation Needs (01/26/2024)  Utilities: Not At Risk (01/26/2024)  Social Connections: Unknown (01/14/2024)  Tobacco Use: High Risk (01/26/2024)   SDOH Interventions:     Readmission Risk Interventions     No data to display

## 2024-01-28 NOTE — Procedures (Addendum)
 Interventional Radiology Procedure:   Indications: Right pyonephrosis with persistent hydronephrosis, s/p nephrostomy tube placement  Procedure: Nephrostogram with nephrostomy tube manipulation and exchange  Findings: Thick purulent fluid throughout collecting system.   5 Fr catheter and 12 Fr drain manipulated within collecting system.  Small communications with perinephric space. Removed greater than 100 ml of yellow and bloody purulent fluid from collecting system.  New 12 Fr drain placed in collecting system.     Complications: None     EBL: Minimal  Plan: Need to continue with routine flushing of the nephrostomy tube.  Follow output.  Fluid sent again for culture.  Watch for bacteremia and sepsis after drain manipulation and exchange.    Delphina Schum R. Lowella Dandy, MD  Pager: [phone]

## 2024-01-28 NOTE — Discharge Instructions (Signed)

## 2024-01-28 NOTE — Progress Notes (Signed)
 Progress Note   Patient: Belinda Oneill DOB: 24-Dec-1960 DOA: 01/26/2024     2 DOS: the patient was seen and examined on 01/28/2024   Brief hospital course: Belinda Oneill is a 63 y.o. female with history of GERD, renal stones, recent percutaneous nephrostomy placement on 01/15/2024, being set discharged from the hospital on 01/20/2024 after hospitalization for pyelonephritis secondary to Proteus mirabilis and acute unilateral obstructive uropathy due to kidney stone.  She was discharged on Keflex to complete 2-week course of antibiotics. She presented to the hospital again on 01/26/2024 because of back pain and fever. She was readmitted to the hospital for pyelonephritis. Urology, IR involved.  Assessment and Plan: * Pyelonephritis Acute right pyelonephritis: Continue empiric IV ceftriaxone. Analgesia is as needed for pain (Tylenol, IV Dilaudid and percocet) Urine culture no growth. Persistent right large hydronephrosis, suspected urinomas or abscesses on CT abdomen and pelvis, nonobstructing left renal calculi: Right nephrostomy tube in place (placed on 01/15/2024 prior to admission).  Urology advised IR eval. IR performed Nephrostogram with nephrostomy tube manipulation and exchange removed purulent fluid. Follow cultures.  Hyponatremia- Chronic. Continue to trend NA.  Thrombocytosis possible reactive due to infection. Platelets improving. Will follow daily CBC.  Tobacco abuse As needed nicotine patch ordered  Chronic Anemia- Hb - 7.7 Will monitor daily H/H and transfuse if Hb less than 7.  GERD without esophagitis Resumed famotidine 20 mg p.o. twice daily resumed     Out of bed to chair. Incentive spirometry. Nursing supportive care. Fall, aspiration precautions. Diet:  Diet Orders (From admission, onward)     Start     Ordered   01/28/24 1002  Diet regular Room service appropriate? Yes; Fluid consistency: Thin  Diet effective now       Question  Answer Comment  Room service appropriate? Yes   Fluid consistency: Thin      01/28/24 1001           DVT prophylaxis: heparin injection 5,000 Units Start: 01/27/24 2200 Place TED hose Start: 01/26/24 1339  Level of care: Telemetry Surgical   Code Status: Full Code  Subjective: Patient is seen and examined today morning. Her pain not controlled asks for dilaudid and sleep medication. Family at bedside. Eating fair.  Physical Exam: Vitals:   01/28/24 0750 01/28/24 0820 01/28/24 0824 01/28/24 1507  BP: (!) 87/52 (!) 90/46 (!) 95/59 92/64  Pulse: 89 89 95 80  Resp: 18   18  Temp: 98.7 F (37.1 C)   98.3 F (36.8 C)  TempSrc: Oral   Oral  SpO2: 97%   99%  Weight:      Height:        General - Elderly African American female, no apparent distress HEENT - PERRLA, EOMI, atraumatic head, non tender sinuses. Lung - Clear, basal rales, rhonchi, wheezes. Heart - S1, S2 heard, no murmurs, rubs, no pedal edema. Abdomen - Soft, non tender,  right nephrostomy tube Neuro - Alert, awake and oriented x 3, non focal exam. Skin - Warm and dry.  Data Reviewed:      Latest Ref Rng & Units 01/27/2024    3:49 AM 01/26/2024    9:20 AM 01/20/2024    5:11 AM  CBC  WBC 4.0 - 10.5 K/uL 8.9  9.3  9.7   Hemoglobin 12.0 - 15.0 g/dL 7.7  8.9  8.2   Hematocrit 36.0 - 46.0 % 23.8  27.7  24.7   Platelets 150 - 400 K/uL 595  656  634       Latest Ref Rng & Units 01/27/2024    3:49 AM 01/26/2024    9:20 AM 01/19/2024    5:23 AM  BMP  Glucose 70 - 99 mg/dL 191  478  99   BUN 8 - 23 mg/dL 6  9  7    Creatinine 0.44 - 1.00 mg/dL 2.95  6.21  3.08   Sodium 135 - 145 mmol/L 132  136  134   Potassium 3.5 - 5.1 mmol/L 4.2  3.6  3.9   Chloride 98 - 111 mmol/L 100  101  99   CO2 22 - 32 mmol/L 23  23  25    Calcium 8.9 - 10.3 mg/dL 8.6  9.3  9.0    IR NEPHROSTOMY EXCHANGE RIGHT Result Date: 01/28/2024 INDICATION: 63 year old with obstructed right kidney secondary to stones. Concern for chronic infection.  Percutaneous nephrostomy tube was placed on 01/15/2024. Follow-up imaging demonstrates persistent right hydronephrosis with new perinephric collections along the upper pole. Plan for nephrostogram through existing catheter with catheter exchange and manipulation. EXAM: 1. NEPHROSTOGRAM THROUGH EXISTING NEPHROSTOMY TUBE 2. NEPHROSTOMY TUBE EXCHANGE WITH FLUOROSCOPY Physician: Rachelle Hora. Lowella Dandy, MD COMPARISON:  CT abdomen and pelvis 01/26/2024 MEDICATIONS: 1% lidocaine for local anesthetic ANESTHESIA/SEDATION: None CONTRAST:  40mL OMNIPAQUE IOHEXOL 300 MG/ML SOLN - administered into the collecting system(s) FLUOROSCOPY: Radiation Exposure Index (as provided by the fluoroscopic device): 72 mGy Kerma COMPLICATIONS: None immediate. PROCEDURE: The procedure was explained to the patient. The risks and benefits of the procedure were discussed and the patient's questions were addressed. Informed consent was obtained from the patient. Patient was placed prone. The right nephrostomy tube and surrounding skin were prepped and draped in sterile fashion. Maximal barrier sterile technique was utilized including caps, mask, sterile gowns, sterile gloves, sterile drape, hand hygiene and skin antiseptic. Contrast was injected through the existing nephrostomy tube. Yellow purulent fluid was aspirated from the collecting system. Skin around the nephrostomy tube was anesthetized using 1% lidocaine. Catheter was cut and removed over a Bentson wire. Kumpe catheter was manipulated into the upper pole calices and into the lower pole calices using a Bentson wire and superstiff Amplatz wire. Additional purulent fluid was aspirated. A new 12 [place] multipurpose drain was advanced over a wire into the renal collecting system and a large amount of thick purulent fluid was removed from both the upper and lower pole collecting systems. Collecting system was rigorously irrigated with sterile saline. Greater than 100 mL of purulent fluid was removed. 12  French drain was removed at one point in order to do more catheter manipulation within the collecting system. 12 French drain was replaced and the tip was placed near the renal pelvis extending into the lower pole collecting system. At one point, wire was able to be advanced down the right ureter. Catheter was sutured to skin and attached to a new gravity bag. Fluid was sent for culture. Fluoroscopic images were taken and saved for this procedure. FINDINGS: The nephrostogram demonstrated a very irregular collecting system related to complex purulent fluid within the collecting system. The initial nephrostogram demonstrated 2 areas of presumed contrast extravasation from the upper pole calices into the perinephric space. Suspect these are related to the new perinephric fluid collections. Greater than 100 mL purulent fluid was removed from the collecting system. Initially, the fluid was yellow in color. The residual fluid was bloody by the end of the procedure. Again noted are stones in the renal pelvis and proximal ureter. A wire  did pass beyond the stones in the right ureter. IMPRESSION: 1. Greater than 100 mL of purulent fluid was removed from the right renal collecting system after catheter manipulation and rigorous irrigation. 2. Successful right nephrostomy tube exchange using fluoroscopy. Patient has a 12 [place] multipurpose drain. 3. Bifid collecting system and the collecting systems are all communicating with each other. 4. Two areas are suspicious for contrast extravasation into the perinephric space along the upper pole. This likely explains the new perinephric fluid collections. Electronically Signed   By: Richarda Overlie M.D.   On: 01/28/2024 15:29    Family Communication: Discussed with patient, family at bedside. They understand and agree. All questions answered.  Disposition: Status is: Inpatient Remains inpatient appropriate because: IV pain meds, IR procedure.  Planned Discharge Destination: Home  with Home Health     Time spent: 38 minutes  Author: Marcelino Duster, MD 01/28/2024 5:47 PM Secure chat 7am to 7pm For on call review [email or web address].

## 2024-01-28 NOTE — Plan of Care (Signed)

## 2024-01-29 DIAGNOSIS — D75839 Thrombocytosis, unspecified: Secondary | ICD-10-CM | POA: Diagnosis not present

## 2024-01-29 DIAGNOSIS — N1330 Unspecified hydronephrosis: Secondary | ICD-10-CM | POA: Diagnosis not present

## 2024-01-29 DIAGNOSIS — Z720 Tobacco use: Secondary | ICD-10-CM

## 2024-01-29 DIAGNOSIS — N12 Tubulo-interstitial nephritis, not specified as acute or chronic: Secondary | ICD-10-CM | POA: Diagnosis not present

## 2024-01-29 DIAGNOSIS — D649 Anemia, unspecified: Secondary | ICD-10-CM

## 2024-01-29 DIAGNOSIS — E871 Hypo-osmolality and hyponatremia: Secondary | ICD-10-CM

## 2024-01-29 LAB — CBC
HCT: 24.1 % — ABNORMAL LOW (ref 36.0–46.0)
Hemoglobin: 8 g/dL — ABNORMAL LOW (ref 12.0–15.0)
MCH: 27.4 pg (ref 26.0–34.0)
MCHC: 33.2 g/dL (ref 30.0–36.0)
MCV: 82.5 fL (ref 80.0–100.0)
Platelets: 618 10*3/uL — ABNORMAL HIGH (ref 150–400)
RBC: 2.92 MIL/uL — ABNORMAL LOW (ref 3.87–5.11)
RDW: 14.2 % (ref 11.5–15.5)
WBC: 8.1 10*3/uL (ref 4.0–10.5)
nRBC: 0 % (ref 0.0–0.2)

## 2024-01-29 LAB — BASIC METABOLIC PANEL WITH GFR
Anion gap: 10 (ref 5–15)
BUN: 9 mg/dL (ref 8–23)
CO2: 24 mmol/L (ref 22–32)
Calcium: 8.9 mg/dL (ref 8.9–10.3)
Chloride: 99 mmol/L (ref 98–111)
Creatinine, Ser: 0.77 mg/dL (ref 0.44–1.00)
GFR, Estimated: >60 mL/min (ref >60)
Glucose, Bld: 120 mg/dL — ABNORMAL HIGH (ref 70–99)
Potassium: 3.6 mmol/L (ref 3.5–5.1)
Sodium: 133 mmol/L — ABNORMAL LOW (ref 135–145)

## 2024-01-29 NOTE — Plan of Care (Signed)
  Problem: Education: Goal: Knowledge of General Education information will improve Description: Including pain rating scale, medication(s)/side effects and non-pharmacologic comfort measures Outcome: Progressing   Problem: Health Behavior/Discharge Planning: Goal: Ability to manage health-related needs will improve Outcome: Progressing   Problem: Clinical Measurements: Goal: Ability to maintain clinical measurements within normal limits will improve Outcome: Progressing Goal: Will remain free from infection Outcome: Progressing Goal: Diagnostic test results will improve Outcome: Progressing Goal: Respiratory complications will improve Outcome: Progressing Goal: Cardiovascular complication will be avoided Outcome: Progressing   Problem: Activity: Goal: Risk for activity intolerance will decrease Outcome: Progressing   Problem: Nutrition: Goal: Adequate nutrition will be maintained Outcome: Progressing   Problem: Coping: Goal: Level of anxiety will decrease Outcome: Progressing   Problem: Elimination: Goal: Will not experience complications related to bowel motility Outcome: Progressing Goal: Will not experience complications related to urinary retention Outcome: Progressing   Problem: Pain Managment: Goal: General experience of comfort will improve and/or be controlled Outcome: Progressing   Problem: Safety: Goal: Ability to remain free from injury will improve Outcome: Progressing   Problem: Skin Integrity: Goal: Risk for impaired skin integrity will decrease Outcome: Progressing   Problem: Urinary Elimination: Goal: Signs and symptoms of infection will decrease Outcome: Progressing   Problem: Clinical Measurements: Goal: Ability to maintain clinical measurements within normal limits will improve Outcome: Progressing

## 2024-01-29 NOTE — Progress Notes (Signed)
 Referring Physician(s): Michiel Cowboy  Supervising Physician: Richarda Overlie  Patient Status:  [facility] - In-pt  Chief Complaint:  Right nephrolithiasis; right hydronephrosis - s/p image guided right percutaneous nephrostomy placement on 01/15/24.   Dr. Lowella Dandy manipulated drain and exchanged tube on 01/28/24.    Subjective:  She is lying in bed at time of exam, calm, pleasant, interested in care. Endorses R flank pain, cloudy urine output, cloudy/bloody PCN output, constipation. Denies fever, chills, abd pain, N/V. GI discomfort that she associates with abx has improved.  After exam, she is able to ambulate without assistance to restroom, though mobility causes increased pain.   Allergies: Patient has no known allergies.  Medications: Prior to Admission medications   Medication Sig Start Date End Date Taking? Authorizing Provider  acetaminophen (TYLENOL) 500 MG tablet Take 2 tablets (1,000 mg total) by mouth every 6 (six) hours as needed. 01/05/24 01/04/25  Gladys Damme, PA-C  cephALEXin (KEFLEX) 500 MG capsule Take 1 capsule (500 mg total) by mouth 4 (four) times daily for 9 days. 01/20/24 01/29/24  Marrion Coy, MD  famotidine (PEPCID) 20 MG tablet Take 1 tablet (20 mg total) by mouth 2 (two) times daily. 04/02/20 01/18/24  Miguel Aschoff., MD  iron polysaccharides (NIFEREX) 150 MG capsule Take 1 capsule (150 mg total) by mouth daily. 01/21/24   Marrion Coy, MD  oxyCODONE-acetaminophen (PERCOCET/ROXICET) 5-325 MG tablet Take 1-2 tablets by mouth every 4 (four) hours as needed for moderate pain (pain score 4-6). 01/20/24   Marrion Coy, MD  pantoprazole (PROTONIX) 40 MG tablet Take 1 tablet (40 mg total) by mouth daily. 08/09/19 08/08/20  Minna Antis, MD  senna-docusate (SENOKOT-S) 8.6-50 MG tablet Take 2 tablets by mouth 2 (two) times daily as needed for mild constipation. 01/20/24   Marrion Coy, MD  tamsulosin (FLOMAX) 0.4 MG CAPS capsule Take 1 capsule (0.4 mg total) by mouth daily  for 14 days. 01/20/24 02/03/24  Marrion Coy, MD     Vital Signs: BP 102/73 (BP Location: Left Arm)   Pulse 83   Temp 98.4 F (36.9 C) (Oral)   Resp 18   Ht 5' 4.5" (1.638 m)   Wt 138 lb 14.2 oz (63 kg)   SpO2 100%   BMI 23.47 kg/m   Physical Exam Pulmonary:     Effort: Pulmonary effort is normal.  Abdominal:     General: There is no distension.     Palpations: Abdomen is soft.     Tenderness: There is no abdominal tenderness.  Genitourinary:    Comments: R PCN present and draining bloody purulent fluid into gravity bag. 150 cc in bag at time of exam. R flank tenderness similar to level she has been experiencing. Small amount of purulent drainage on dressing at insertion site but no erythema Skin:    General: Skin is warm and dry.  Neurological:     Mental Status: She is alert and oriented to person, place, and time.  Psychiatric:        Mood and Affect: Mood normal.        Behavior: Behavior normal.      Imaging: IR NEPHROSTOMY EXCHANGE RIGHT Result Date: 01/28/2024 INDICATION: 63 year old with obstructed right kidney secondary to stones. Concern for chronic infection. Percutaneous nephrostomy tube was placed on 01/15/2024. Follow-up imaging demonstrates persistent right hydronephrosis with new perinephric collections along the upper pole. Plan for nephrostogram through existing catheter with catheter exchange and manipulation. EXAM: 1. NEPHROSTOGRAM THROUGH EXISTING NEPHROSTOMY TUBE 2. NEPHROSTOMY TUBE  EXCHANGE WITH FLUOROSCOPY Physician: Rachelle Hora. Lowella Dandy, MD COMPARISON:  CT abdomen and pelvis 01/26/2024 MEDICATIONS: 1% lidocaine for local anesthetic ANESTHESIA/SEDATION: None CONTRAST:  40mL OMNIPAQUE IOHEXOL 300 MG/ML SOLN - administered into the collecting system(s) FLUOROSCOPY: Radiation Exposure Index (as provided by the fluoroscopic device): 72 mGy Kerma COMPLICATIONS: None immediate. PROCEDURE: The procedure was explained to the patient. The risks and benefits of the procedure  were discussed and the patient's questions were addressed. Informed consent was obtained from the patient. Patient was placed prone. The right nephrostomy tube and surrounding skin were prepped and draped in sterile fashion. Maximal barrier sterile technique was utilized including caps, mask, sterile gowns, sterile gloves, sterile drape, hand hygiene and skin antiseptic. Contrast was injected through the existing nephrostomy tube. Yellow purulent fluid was aspirated from the collecting system. Skin around the nephrostomy tube was anesthetized using 1% lidocaine. Catheter was cut and removed over a Bentson wire. Kumpe catheter was manipulated into the upper pole calices and into the lower pole calices using a Bentson wire and superstiff Amplatz wire. Additional purulent fluid was aspirated. A new 12 [place] multipurpose drain was advanced over a wire into the renal collecting system and a large amount of thick purulent fluid was removed from both the upper and lower pole collecting systems. Collecting system was rigorously irrigated with sterile saline. Greater than 100 mL of purulent fluid was removed. 12 French drain was removed at one point in order to do more catheter manipulation within the collecting system. 12 French drain was replaced and the tip was placed near the renal pelvis extending into the lower pole collecting system. At one point, wire was able to be advanced down the right ureter. Catheter was sutured to skin and attached to a new gravity bag. Fluid was sent for culture. Fluoroscopic images were taken and saved for this procedure. FINDINGS: The nephrostogram demonstrated a very irregular collecting system related to complex purulent fluid within the collecting system. The initial nephrostogram demonstrated 2 areas of presumed contrast extravasation from the upper pole calices into the perinephric space. Suspect these are related to the new perinephric fluid collections. Greater than 100 mL purulent  fluid was removed from the collecting system. Initially, the fluid was yellow in color. The residual fluid was bloody by the end of the procedure. Again noted are stones in the renal pelvis and proximal ureter. A wire did pass beyond the stones in the right ureter. IMPRESSION: 1. Greater than 100 mL of purulent fluid was removed from the right renal collecting system after catheter manipulation and rigorous irrigation. 2. Successful right nephrostomy tube exchange using fluoroscopy. Patient has a 12 [place] multipurpose drain. 3. Bifid collecting system and the collecting systems are all communicating with each other. 4. Two areas are suspicious for contrast extravasation into the perinephric space along the upper pole. This likely explains the new perinephric fluid collections. Electronically Signed   By: Richarda Overlie M.D.   On: 01/28/2024 15:29   CT ABDOMEN PELVIS W CONTRAST Result Date: 01/26/2024 CLINICAL DATA:  History of kidney stone. Concern for pyelonephritis. EXAM: CT ABDOMEN AND PELVIS WITH CONTRAST TECHNIQUE: Multidetector CT imaging of the abdomen and pelvis was performed using the standard protocol following bolus administration of intravenous contrast. RADIATION DOSE REDUCTION: This exam was performed according to the departmental dose-optimization program which includes automated exposure control, adjustment of the mA and/or kV according to patient size and/or use of iterative reconstruction technique. CONTRAST:  OMNIPAQUE IOHEXOL 300 MG/ML  SOLN COMPARISON:  CT abdomen pelvis dated 01/05/2024. FINDINGS: Lower chest: Bibasilar linear and patchy atelectasis. Pneumonia is not excluded. Small pericardial effusion measuring 4 mm in thickness. No intra-abdominal free air or free fluid. Hepatobiliary: The liver is unremarkable. No biliary dilatation. Layering stones in the gallbladder. No pericholecystic fluid or evidence of acute cholecystitis by CT. Pancreas: Unremarkable. No pancreatic ductal  dilatation or surrounding inflammatory changes. Spleen: Normal in size without focal abnormality. Adrenals/Urinary Tract: The adrenal glands are unremarkable. Several nonobstructing left renal calculi measure up to 5 mm in the upper pole. No hydronephrosis on the left. Multiple stones in the proximal right ureter with the largest measuring approximately 12 mm in length. There has been interval placement of a percutaneous right nephrostomy with pigtail tip in the region of the ureteropelvic junction. Interval progression of right hydronephrosis since the prior CT. Several loculated perinephric collections along the upper pole of the right kidney suspicious for urinomas in the setting of caliceal rupture and possible superimposed infection. The largest loculated collection measures approximately 3 x 5 cm. There is slight haziness of the right renal parenchyma and mild perinephric stranding suspicious for pyelonephritis. The urinary bladder is grossly unremarkable. Stomach/Bowel: There is moderate stool throughout the colon. There is no bowel obstruction or active inflammation. The appendix is not visualized with certainty. No inflammatory changes identified in the right lower quadrant. Vascular/Lymphatic: Moderate aortoiliac atherosclerotic disease. The IVC is unremarkable. No portal venous gas. There is no adenopathy. Reproductive: The uterus is retroverted and grossly unremarkable. No suspicious adnexal masses. Other: None Musculoskeletal: No acute or significant osseous findings. IMPRESSION: 1. Interval placement of a percutaneous right nephrostomy with pigtail tip in the region of the ureteropelvic junction. Interval progression of right hydronephrosis since the prior CT. 2. Findings of right pyelonephritis and multiple perinephric collections adjacent to the superior pole of the right kidney, likely urinomas or abscesses. 3. Nonobstructing left renal calculi. No hydronephrosis on the left. 4. Cholelithiasis. 5.  No bowel obstruction. Electronically Signed   By: Elgie Collard M.D.   On: 01/26/2024 12:22    Labs:  CBC: Recent Labs    01/20/24 0511 01/26/24 0920 01/27/24 0349 01/29/24 0415  WBC 9.7 9.3 8.9 8.1  HGB 8.2* 8.9* 7.7* 8.0*  HCT 24.7* 27.7* 23.8* 24.1*  PLT 634* 656* 595* 618*    COAGS: Recent Labs    01/15/24 0843  INR 1.3*  APTT 43*    BMP: Recent Labs    01/19/24 0523 01/26/24 0920 01/27/24 0349 01/29/24 0415  NA 134* 136 132* 133*  K 3.9 3.6 4.2 3.6  CL 99 101 100 99  CO2 25 23 23 24   GLUCOSE 99 125* 102* 120*  BUN 7* 9 6* 9  CALCIUM 9.0 9.3 8.6* 8.9  CREATININE 0.78 0.90 0.75 0.77  GFRNONAA >60 >60 >60 >60    LIVER FUNCTION TESTS: Recent Labs    01/05/24 1608 01/14/24 1549  BILITOT 0.6 0.6  AST 23 22  ALT 27 27  ALKPHOS 75 93  PROT 8.1 8.5*  ALBUMIN 3.1* 2.7*    Assessment and Plan:  s/p image guided right percutaneous nephrostomy placement on 01/15/24 with exchange in IR on 4/2. - R flank pain not improved, not worsened. Pt still requiring PRN pain medication  - no I/O totals charted from nursing yet today, but is draining based on ~136ml in bag at time of exam - transient fever overnight after manipulation/irrigation, afebrile 4/3. She notes that the urine she is voiding from urethra is cloudy  but not bloody like the PCN drainage. -PCN flushed and aspirated during bedside rounding, flushes easily, able to aspirate back the 10mL from the flush plus 5 mL thick purulent/bloody drainage.      Fluid removed by Dr. Lowella Dandy on 4/2 during IR procedure was thick, required rigorous irrigation and catheter manipulation. Drain needs to be flushed TID with 10cc NS while inpatient. Reinforced the importance of this with nursing. Dressing changes every 2-3 days or when soiled. She is receiving IV abx and PRN pain medication managed by hospitalist team.   Urology is following this patient and IR will continue to round. Call IR if sudden change in output or  unable to flush.   Please contact IR prior to DC for follow up orders to be placed. If the PCN is going to be present long term, the patient will need routine exchanges about every 6-10 weeks. IR will order exchange and scheduler will contact patient with appointment details.   Electronically Signed: Carlton Adam, NP 01/29/2024, 1:45 PM   I spent a total of 25 Minutes at the the patient's bedside AND on the patient's hospital floor or unit, greater than 50% of which was counseling/coordinating care for s/p image guided right percutaneous nephrostomy placement on 01/15/24, manipulation/exchange of PCN on 01/28/24

## 2024-01-29 NOTE — Plan of Care (Signed)

## 2024-01-29 NOTE — Progress Notes (Signed)
 Progress Note   Patient: Belinda Oneill ZOX:[id number] DOB: [birth year] DOA: 01/26/2024     3 DOS: the patient was seen and examined on 01/29/2024   Brief hospital course: Belinda Oneill is a 63 y.o. female with history of GERD, renal stones, recent percutaneous nephrostomy placement on 01/15/2024, being set discharged from the hospital on 01/20/2024 after hospitalization for pyelonephritis secondary to Proteus mirabilis and acute unilateral obstructive uropathy due to kidney stone.  She was discharged on Keflex to complete 2-week course of antibiotics. She presented to the hospital again on 01/26/2024 because of back pain and fever.  She was readmitted to the hospital for pyelonephritis. Urology advised IR eval. IR performed Nephrostogram with nephrostomy tube manipulation and exchange   Assessment and Plan: * Acute Right Pyelonephritis Continue empiric IV ceftriaxone.  Pain control with Tylenol, IV Dilaudid and percocet. Urine culture no growth. Persistent right large hydronephrosis, suspected urinomas or abscesses on CT abdomen and pelvis, nonobstructing left renal calculi: Right nephrostomy tube in place (placed on 01/15/2024 prior to admission).  Urology advised IR eval. 4/2 IR performed Nephrostogram with nephrostomy tube manipulation and exchange removed purulent fluid. Follow cultures.  Hyponatremia- Chronic. Stable.  Thrombocytosis possible reactive due to infection. Platelets high. Follow daily CBC.  Tobacco abuse As needed nicotine patch ordered  Chronic Anemia- Hb - 8.0 Will monitor daily and transfuse if Hb less than 7.  GERD without esophagitis Continue famotidine 20 mg p.o. twice daily.     Out of bed to chair. Incentive spirometry. Nursing supportive care. Fall, aspiration precautions. Diet:  Diet Orders (From admission, onward)     Start     Ordered   01/28/24 1002  Diet regular Room service appropriate? Yes; Fluid consistency: Thin  Diet effective now        Question Answer Comment  Room service appropriate? Yes   Fluid consistency: Thin      01/28/24 1001           DVT prophylaxis: heparin injection 5,000 Units Start: 01/27/24 2200 Place TED hose Start: 01/26/24 1339  Level of care: Telemetry Surgical   Code Status: Full Code  Subjective: Patient is seen and examined today morning. Her pain not controlled asks for dilaudid and sleep medication. Family at bedside. Eating fair.  Physical Exam: Vitals:   01/29/24 0337 01/29/24 0830 01/29/24 0833 01/29/24 0834  BP: 100/74 (!) 89/61 98/73 102/73  Pulse: 84 83    Resp: 20 18    Temp: 98.3 F (36.8 C) 98.4 F (36.9 C)    TempSrc: Oral Oral    SpO2: 97% 100%    Weight:      Height:        General - Elderly African American female, in distress due to pain. HEENT - PERRLA, EOMI, atraumatic head, non tender sinuses. Lung - Clear, basal rales, rhonchi, wheezes. Heart - S1, S2 heard, no murmurs, rubs, no pedal edema. Abdomen - Soft, non tender,  right nephrostomy tube Neuro - Alert, awake and oriented x 3, non focal exam. Skin - Warm and dry.  Data Reviewed:      Latest Ref Rng & Units 01/29/2024    4:15 AM 01/27/2024    3:49 AM 01/26/2024    9:20 AM  CBC  WBC 4.0 - 10.5 K/uL 8.1  8.9  9.3   Hemoglobin 12.0 - 15.0 g/dL 8.0  7.7  8.9   Hematocrit 36.0 - 46.0 % 24.1  23.8  27.7   Platelets 150 - 400  K/uL 618  595  656       Latest Ref Rng & Units 01/29/2024    4:15 AM 01/27/2024    3:49 AM 01/26/2024    9:20 AM  BMP  Glucose 70 - 99 mg/dL 604  540  981   BUN 8 - 23 mg/dL 9  6  9    Creatinine 0.44 - 1.00 mg/dL 1.91  4.78  2.95   Sodium 135 - 145 mmol/L 133  132  136   Potassium 3.5 - 5.1 mmol/L 3.6  4.2  3.6   Chloride 98 - 111 mmol/L 99  100  101   CO2 22 - 32 mmol/L 24  23  23    Calcium 8.9 - 10.3 mg/dL 8.9  8.6  9.3    IR NEPHROSTOMY EXCHANGE RIGHT Result Date: 01/28/2024 INDICATION: 63 year old with obstructed right kidney secondary to stones. Concern for chronic  infection. Percutaneous nephrostomy tube was placed on 01/15/2024. Follow-up imaging demonstrates persistent right hydronephrosis with new perinephric collections along the upper pole. Plan for nephrostogram through existing catheter with catheter exchange and manipulation. EXAM: 1. NEPHROSTOGRAM THROUGH EXISTING NEPHROSTOMY TUBE 2. NEPHROSTOMY TUBE EXCHANGE WITH FLUOROSCOPY Physician: Rachelle Hora. Lowella Dandy, MD COMPARISON:  CT abdomen and pelvis 01/26/2024 MEDICATIONS: 1% lidocaine for local anesthetic ANESTHESIA/SEDATION: None CONTRAST:  40mL OMNIPAQUE IOHEXOL 300 MG/ML SOLN - administered into the collecting system(s) FLUOROSCOPY: Radiation Exposure Index (as provided by the fluoroscopic device): 72 mGy Kerma COMPLICATIONS: None immediate. PROCEDURE: The procedure was explained to the patient. The risks and benefits of the procedure were discussed and the patient's questions were addressed. Informed consent was obtained from the patient. Patient was placed prone. The right nephrostomy tube and surrounding skin were prepped and draped in sterile fashion. Maximal barrier sterile technique was utilized including caps, mask, sterile gowns, sterile gloves, sterile drape, hand hygiene and skin antiseptic. Contrast was injected through the existing nephrostomy tube. Yellow purulent fluid was aspirated from the collecting system. Skin around the nephrostomy tube was anesthetized using 1% lidocaine. Catheter was cut and removed over a Bentson wire. Kumpe catheter was manipulated into the upper pole calices and into the lower pole calices using a Bentson wire and superstiff Amplatz wire. Additional purulent fluid was aspirated. A new 12 [place] multipurpose drain was advanced over a wire into the renal collecting system and a large amount of thick purulent fluid was removed from both the upper and lower pole collecting systems. Collecting system was rigorously irrigated with sterile saline. Greater than 100 mL of purulent fluid was  removed. 12 French drain was removed at one point in order to do more catheter manipulation within the collecting system. 12 French drain was replaced and the tip was placed near the renal pelvis extending into the lower pole collecting system. At one point, wire was able to be advanced down the right ureter. Catheter was sutured to skin and attached to a new gravity bag. Fluid was sent for culture. Fluoroscopic images were taken and saved for this procedure. FINDINGS: The nephrostogram demonstrated a very irregular collecting system related to complex purulent fluid within the collecting system. The initial nephrostogram demonstrated 2 areas of presumed contrast extravasation from the upper pole calices into the perinephric space. Suspect these are related to the new perinephric fluid collections. Greater than 100 mL purulent fluid was removed from the collecting system. Initially, the fluid was yellow in color. The residual fluid was bloody by the end of the procedure. Again noted are stones in the renal pelvis  and proximal ureter. A wire did pass beyond the stones in the right ureter. IMPRESSION: 1. Greater than 100 mL of purulent fluid was removed from the right renal collecting system after catheter manipulation and rigorous irrigation. 2. Successful right nephrostomy tube exchange using fluoroscopy. Patient has a 12 [place] multipurpose drain. 3. Bifid collecting system and the collecting systems are all communicating with each other. 4. Two areas are suspicious for contrast extravasation into the perinephric space along the upper pole. This likely explains the new perinephric fluid collections. Electronically Signed   By: Richarda Overlie M.D.   On: 01/28/2024 15:29    Family Communication: Discussed with patient, family at bedside. They understand and agree. All questions answered.  Disposition: Status is: Inpatient Remains inpatient appropriate because: IV pain meds, s/p IR procedure follow cultures.   Planned Discharge Destination: Home with Home Health     Time spent: 39 minutes  Author: Marcelino Duster, MD 01/29/2024 5:33 PM Secure chat 7am to 7pm For on call review [email or web address].

## 2024-01-30 DIAGNOSIS — N12 Tubulo-interstitial nephritis, not specified as acute or chronic: Secondary | ICD-10-CM | POA: Diagnosis not present

## 2024-01-30 DIAGNOSIS — D75839 Thrombocytosis, unspecified: Secondary | ICD-10-CM | POA: Diagnosis not present

## 2024-01-30 DIAGNOSIS — Z720 Tobacco use: Secondary | ICD-10-CM | POA: Diagnosis not present

## 2024-01-30 DIAGNOSIS — N1330 Unspecified hydronephrosis: Secondary | ICD-10-CM | POA: Diagnosis not present

## 2024-01-30 LAB — CBC
HCT: 23 % — ABNORMAL LOW (ref 36.0–46.0)
Hemoglobin: 7.5 g/dL — ABNORMAL LOW (ref 12.0–15.0)
MCH: 26.9 pg (ref 26.0–34.0)
MCHC: 32.6 g/dL (ref 30.0–36.0)
MCV: 82.4 fL (ref 80.0–100.0)
Platelets: 622 10*3/uL — ABNORMAL HIGH (ref 150–400)
RBC: 2.79 MIL/uL — ABNORMAL LOW (ref 3.87–5.11)
RDW: 14.4 % (ref 11.5–15.5)
WBC: 9.7 10*3/uL (ref 4.0–10.5)
nRBC: 0 % (ref 0.0–0.2)

## 2024-01-30 LAB — BASIC METABOLIC PANEL WITH GFR
Anion gap: 8 (ref 5–15)
BUN: 14 mg/dL (ref 8–23)
CO2: 26 mmol/L (ref 22–32)
Calcium: 9.1 mg/dL (ref 8.9–10.3)
Chloride: 99 mmol/L (ref 98–111)
Creatinine, Ser: 0.81 mg/dL (ref 0.44–1.00)
GFR, Estimated: >60 mL/min (ref >60)
Glucose, Bld: 96 mg/dL (ref 70–99)
Potassium: 4.6 mmol/L (ref 3.5–5.1)
Sodium: 133 mmol/L — ABNORMAL LOW (ref 135–145)

## 2024-01-30 MED ORDER — TRAZODONE HCL 50 MG PO TABS
25.0000 mg | ORAL_TABLET | Freq: Once | ORAL | Status: AC
  Administered 2024-01-30: 25 mg via ORAL
  Filled 2024-01-30: qty 1

## 2024-01-30 MED ORDER — ALPRAZOLAM 0.5 MG PO TABS
0.5000 mg | ORAL_TABLET | Freq: Three times a day (TID) | ORAL | Status: DC | PRN
  Administered 2024-01-30 – 2024-02-06 (×8): 0.5 mg via ORAL
  Filled 2024-01-30 (×9): qty 1

## 2024-01-30 MED ORDER — DOCUSATE SODIUM 100 MG PO CAPS
100.0000 mg | ORAL_CAPSULE | Freq: Two times a day (BID) | ORAL | Status: DC
  Administered 2024-01-30 – 2024-02-06 (×14): 100 mg via ORAL
  Filled 2024-01-30 (×14): qty 1

## 2024-01-30 MED ORDER — POLYETHYLENE GLYCOL 3350 17 G PO PACK
17.0000 g | PACK | Freq: Every day | ORAL | Status: DC
  Administered 2024-01-30 – 2024-02-06 (×7): 17 g via ORAL
  Filled 2024-01-30 (×8): qty 1

## 2024-01-30 NOTE — Progress Notes (Signed)
 Patient complaining of difficulty breathing and increased pain at sight of nephrostomy tube and internal flank pain.

## 2024-01-30 NOTE — Progress Notes (Signed)
 Reviewed and agree with Student RN assessment  01/30/24 1300  Charting Type  Charting Type Shift Assessment  [place] Work Intensity Score (Update with each assessment and as needed)  Work Intensity Score (Level) 2  Level 2 Intensity S.Meds scheduled or prns 2-4 hours  Neurological  Neuro (WDL) WDL  Orientation Level Oriented X4  Cognition Appropriate attention/concentration;Follows commands;No memory impairment  Speech Clear  Glasgow Coma Scale  Eye Opening 4  Best Verbal Response (NON-intubated) 5  Best Motor Response 6  Glasgow Coma Scale Score 15  NuDESC - Delirium Risk Factor Assessment (Complete for non-ICU patients)  Delirium Risk Factor Assessment Severe illness / infection  NuDESC - Nursing Delirium Screening Scale (Complete for non-ICU patients)  Disorientation 0  Inappropriate Behavior 0  Inappropriate Communications 0  Illusions/hallucinations 0  Psychomotor Retardation 0  NuDESC Total Score 0  NuDESC - Delirium Prevention:  Universal Requirements (Complete for all non-ICU patients with a delirium risk factor)  Universal Precautions Initiated *See Row Information* Yes  Oral Assessment (Complete on admission/transfer/every shift)  Oral Assessment  (WDL) WDL  Lips Symmetrical  Teeth Missing (Comment)  Tongue Pink  Mucous Membrane(s) Moist  Saliva Moist, saliva free flowing  Is patient on any of following O2 devices? None of the above  Nutritional status No high risk factors  Oral Assessment Risk  Low Risk  HEENT  HEENT (WDL) WDL  Vision Check No  Voice Clear  Respiratory  Respiratory Pattern Regular;Unlabored;Symmetrical  Chest Assessment Chest expansion symmetrical  Respiratory (WDL) WDL  Cardiac  Cardiac (WDL) WDL  Pulse Regular  Heart Sounds S1, S2  ECG Monitor No  Antiarrhythmic device  Antiarrhythmic device No  Vascular  Vascular (WDL) WDL  Musculoskeletal  Musculoskeletal (WDL) WDL  Assistive Device BSC  Generalized Weakness Yes  Weight  Bearing Restrictions Per Provider Order No  Gastrointestinal  Gastrointestinal (WDL) WDL  Abdomen Inspection Soft  Bowel Sounds Assessment Active  Tenderness Tender  Passing Flatus Yes  Stool Characteristics  Bowel Incontinence No  GU Assessment  Genitourinary (WDL) X  Genitourinary Symptoms Other (Comment) (Nephrostomy, right)  Genitalia  Female Genitalia Intact  Urine Characteristics  Urinary Incontinence No  Urine Color Yellow/straw  Urine Appearance Cloudy  Nephrostomy Right 12 Fr.  Placement Date/Time: 01/28/24 1309   Person Inserting LDA: Dr. Lowella Dandy  Location: Right  Tube Size (Fr.): 12 Fr.  Urine Returned: Yes  Stoma Assessment Other (comment) (draining purulent exudate; changed)  Dressing Status Clean, Dry, Intact  Dressing Type Split gauze;Abdominal Binder  Tube Status To gravity  Collection Container Standard drainage bag  Securement Method Securing device (Describe) (sutured)  Input (mL) 10 mL  Psychosocial  Psychosocial (WDL) WDL  Patient Behaviors Appropriate for situation;Cooperative;Anxious  Needs Expressed Physical;Emotional  Emotional support given Given to patient  Integumentary  Integumentary (WDL) X  Skin Color Appropriate for ethnicity  Skin Condition Dry  Skin Integrity Intact  Skin Turgor Non-tenting  Sacral Foam Prophylactic Dressing  Dressing Interventions Skin assessed under dressing  Medical Device Prophylactic Dressing  Skin Assessed Under All Medical Device Prophylactic Dressings (if applicable) Done - Skin Intact  Mobility Assessment: RN to perform for Med/Surg, Progressive Care, Stepdown, & Geropsych at admission/transfer/every shift/prn  Does patient have an order for bedrest or is patient medically unstable No - Continue assessment  What is the highest level of mobility based on the progressive mobility assessment? Level 5 (Walks with assist in room/hall) - Balance while stepping forward/back and can walk in room with assist -  Complete   Braden Scale (Ages 8 and up)  Sensory Perceptions 4  Moisture 4  Activity 3  Mobility 3  Nutrition 3  Friction and Shear 3  Braden Scale Score 20  Braden Interventions  Patient NOT compliant with following measures Reposition approximately every 2 hours  Neurological  Level of Consciousness Alert

## 2024-01-30 NOTE — Progress Notes (Signed)
 Progress Note   Patient: Belinda Oneill WGN:[id number] DOB: April 21, 1961 DOA: 01/26/2024     4 DOS: the patient was seen and examined on 01/30/2024   Brief hospital course: Belinda Oneill is a 63 y.o. female with history of GERD, renal stones, recent percutaneous nephrostomy placement on 01/15/2024, being set discharged from the hospital on 01/20/2024 after hospitalization for pyelonephritis secondary to Proteus mirabilis and acute unilateral obstructive uropathy due to kidney stone.  She was discharged on Keflex to complete 2-week course of antibiotics. She presented to the hospital again on 01/26/2024 because of back pain and fever.  She was readmitted to the hospital for pyelonephritis. Urology advised IR eval. IR performed Nephrostogram with nephrostomy tube manipulation and exchange   Assessment and Plan: * Acute Right Pyelonephritis Persistent right large hydronephrosis, suspected urinomas or abscesses on CT abdomen and pelvis, nonobstructing left renal calculi: Right nephrostomy tube in place (placed on 01/15/2024 prior to admission). 4/2 IR performed Nephrostogram with nephrostomy tube manipulation and exchange removed purulent fluid. Follow cultures. Continue empiric IV ceftriaxone pending drain cultures. Urine culture no growth. Today she complains of worsening pain, more after PC flushed yesterday. Continue pain control with Tylenol, IV Dilaudid and percocet.   Hyponatremia- Chronic and stable.  Thrombocytosis Possibly reactive due to infection. Platelets high at 622. Follow daily CBC.  Tobacco abuse Smoking cessation counseled. Nicotine patch PRN ordered  Chronic Anemia- Hb - 7.5. No active bleeding. She is on iron supplements. Continue constipation regimen. Monitor daily and transfuse if Hb less than 7.  GERD without esophagitis Continue famotidine 20 mg p.o. twice daily.     Out of bed to chair. Incentive spirometry. Nursing supportive care. Fall, aspiration  precautions. Diet:  Diet Orders (From admission, onward)     Start     Ordered   01/28/24 1002  Diet regular Room service appropriate? Yes; Fluid consistency: Thin  Diet effective now       Question Answer Comment  Room service appropriate? Yes   Fluid consistency: Thin      01/28/24 1001           DVT prophylaxis: heparin injection 5,000 Units Start: 01/27/24 2200 Place TED hose Start: 01/26/24 1339  Level of care: Telemetry Surgical   Code Status: Full Code  Subjective: Patient is seen and examined today morning. Her pain worsened since PCN flushed. Eating poor, has constipation. No family at bedside.   Physical Exam: Vitals:   01/30/24 0352 01/30/24 0602 01/30/24 0758 01/30/24 1311  BP: (!) 87/66 123/81 106/80 96/65  Pulse: 85 93 89 93  Resp: 20  19   Temp: 98.3 F (36.8 C)  98.3 F (36.8 C) 98.5 F (36.9 C)  TempSrc: Oral  Oral Oral  SpO2: 98%  97% 98%  Weight:      Height:        General - Elderly African American female, in distress due to pain. HEENT - PERRLA, EOMI, atraumatic head, non tender sinuses. Lung - Clear, basal rales, rhonchi, wheezes. Heart - S1, S2 heard, no murmurs, rubs, no pedal edema. Abdomen - Soft, non tender,  right nephrostomy tube Neuro - Alert, awake and oriented x 3, non focal exam. Skin - Warm and dry.  Data Reviewed:      Latest Ref Rng & Units 01/30/2024    4:44 AM 01/29/2024    4:15 AM 01/27/2024    3:49 AM  CBC  WBC 4.0 - 10.5 K/uL 9.7  8.1  8.9  Hemoglobin 12.0 - 15.0 g/dL 7.5  8.0  7.7   Hematocrit 36.0 - 46.0 % 23.0  24.1  23.8   Platelets 150 - 400 K/uL 622  618  595       Latest Ref Rng & Units 01/30/2024    4:44 AM 01/29/2024    4:15 AM 01/27/2024    3:49 AM  BMP  Glucose 70 - 99 mg/dL 96  098  119   BUN 8 - 23 mg/dL 14  9  6    Creatinine 0.44 - 1.00 mg/dL 1.47  8.29  5.62   Sodium 135 - 145 mmol/L 133  133  132   Potassium 3.5 - 5.1 mmol/L 4.6  3.6  4.2   Chloride 98 - 111 mmol/L 99  99  100   CO2 22 - 32  mmol/L 26  24  23    Calcium 8.9 - 10.3 mg/dL 9.1  8.9  8.6    No results found.   Family Communication: Discussed with patient, she understand and agree. All questions answered.  Disposition: Status is: Inpatient Remains inpatient appropriate because: IV pain meds, s/p IR procedure follow cultures.  Planned Discharge Destination: Home with Home Health     Time spent: 38 minutes  Author: Marcelino Duster, MD 01/30/2024 4:42 PM Secure chat 7am to 7pm For on call review [email or web address].

## 2024-01-31 DIAGNOSIS — N12 Tubulo-interstitial nephritis, not specified as acute or chronic: Secondary | ICD-10-CM | POA: Diagnosis not present

## 2024-01-31 DIAGNOSIS — N1330 Unspecified hydronephrosis: Secondary | ICD-10-CM | POA: Diagnosis not present

## 2024-01-31 DIAGNOSIS — D75839 Thrombocytosis, unspecified: Secondary | ICD-10-CM | POA: Diagnosis not present

## 2024-01-31 DIAGNOSIS — Z720 Tobacco use: Secondary | ICD-10-CM | POA: Diagnosis not present

## 2024-01-31 LAB — CBC
HCT: 24.9 % — ABNORMAL LOW (ref 36.0–46.0)
Hemoglobin: 8.1 g/dL — ABNORMAL LOW (ref 12.0–15.0)
MCH: 27 pg (ref 26.0–34.0)
MCHC: 32.5 g/dL (ref 30.0–36.0)
MCV: 83 fL (ref 80.0–100.0)
Platelets: 668 10*3/uL — ABNORMAL HIGH (ref 150–400)
RBC: 3 MIL/uL — ABNORMAL LOW (ref 3.87–5.11)
RDW: 14.5 % (ref 11.5–15.5)
WBC: 8.2 10*3/uL (ref 4.0–10.5)
nRBC: 0 % (ref 0.0–0.2)

## 2024-01-31 LAB — BASIC METABOLIC PANEL WITH GFR
Anion gap: 9 (ref 5–15)
BUN: 14 mg/dL (ref 8–23)
CO2: 29 mmol/L (ref 22–32)
Calcium: 9.6 mg/dL (ref 8.9–10.3)
Chloride: 99 mmol/L (ref 98–111)
Creatinine, Ser: 0.87 mg/dL (ref 0.44–1.00)
GFR, Estimated: >60 mL/min (ref >60)
Glucose, Bld: 91 mg/dL (ref 70–99)
Potassium: 4.6 mmol/L (ref 3.5–5.1)
Sodium: 137 mmol/L (ref 135–145)

## 2024-01-31 MED ORDER — TRAZODONE HCL 50 MG PO TABS
50.0000 mg | ORAL_TABLET | Freq: Every day | ORAL | Status: DC
  Administered 2024-01-31 – 2024-02-05 (×6): 50 mg via ORAL
  Filled 2024-01-31 (×6): qty 1

## 2024-01-31 MED ORDER — CEFADROXIL 500 MG PO CAPS
1000.0000 mg | ORAL_CAPSULE | Freq: Two times a day (BID) | ORAL | Status: AC
  Administered 2024-02-01 (×2): 1000 mg via ORAL
  Filled 2024-01-31 (×2): qty 2

## 2024-01-31 MED ORDER — OXYCODONE-ACETAMINOPHEN 5-325 MG PO TABS
1.0000 | ORAL_TABLET | Freq: Four times a day (QID) | ORAL | Status: DC | PRN
  Administered 2024-02-01 – 2024-02-02 (×3): 1 via ORAL
  Filled 2024-01-31 (×3): qty 1

## 2024-01-31 NOTE — Progress Notes (Signed)
 Progress Note   Patient: Belinda Oneill MWU:[id number] DOB: [birth year] DOA: 01/26/2024     5 DOS: the patient was seen and examined on 01/31/2024   Brief hospital course: Crucita ERLINDA SOLINGER is a 63 y.o. female with history of GERD, renal stones, recent percutaneous nephrostomy placement on 01/15/2024, being set discharged from the hospital on 01/20/2024 after hospitalization for pyelonephritis secondary to Proteus mirabilis and acute unilateral obstructive uropathy due to kidney stone.  She was discharged on Keflex to complete 2-week course of antibiotics. She presented to the hospital again on 01/26/2024 because of back pain and fever.  She was readmitted to the hospital for pyelonephritis. Urology advised IR eval. IR performed Nephrostogram with nephrostomy tube manipulation and exchange  4/5: Change IV Rocephin to oral Duricef, cutting back on Percocet from 2 tablets to 1 tablet, added trazodone for sleep  Assessment and Plan: * Acute Right Pyelonephritis Persistent right large hydronephrosis, suspected urinomas or abscesses on CT abdomen and pelvis, nonobstructing left renal calculi: Right nephrostomy tube in place (placed on 01/15/2024 prior to admission). 4/2 IR performed Nephrostogram with nephrostomy tube manipulation and exchange removed purulent fluid. Follow cultures. Completed 6 days of IV ceftriaxone.  Will switch her to oral Duricef her pain is somewhat better controlled continue pain control with Tylenol, IV Dilaudid and percocet, cutting back on Percocet from 2 tablets to 1 tablet today  Hyponatremia- Chronic and stable.  Thrombocytosis Possibly reactive due to infection. Platelets high at 622. Follow daily CBC.  Tobacco abuse Smoking cessation counseled. Nicotine patch PRN ordered  Chronic Anemia- Hb - 7.5. No active bleeding. She is on iron supplements. Continue constipation regimen. Monitor daily and transfuse if Hb less than 7.  GERD without esophagitis Continue  famotidine 20 mg p.o. twice daily.  Insomnia She is requesting to stop melatonin and agreeable to try trazodone      Diet:  Diet Orders (From admission, onward)     Start     Ordered   01/28/24 1002  Diet regular Room service appropriate? Yes; Fluid consistency: Thin  Diet effective now       Question Answer Comment  Room service appropriate? Yes   Fluid consistency: Thin      01/28/24 1001           DVT prophylaxis: heparin injection 5,000 Units Start: 01/27/24 2200 Place TED hose Start: 01/26/24 1339  Level of care: Telemetry Surgical   Code Status: Full Code  Subjective: Patient is seen and examined today morning. Her pain is some better but was unable to sleep well.  Requesting different medicine than melatonin eating poor,   Physical Exam: Vitals:   01/30/24 1805 01/30/24 1924 01/31/24 0350 01/31/24 0800  BP: 121/68 (!) 97/59 115/76 (!) 99/59  Pulse: 95 84 71 74  Resp: 16 18 16 18   Temp: 98.2 F (36.8 C) 98.3 F (36.8 C) 97.7 F (36.5 C) 97.8 F (36.6 C)  TempSrc:  Oral Oral   SpO2: 98% 98% 94% 95%  Weight:      Height:        General - Elderly African American female, in distress due to pain. HEENT - PERRLA, EOMI, atraumatic head, non tender sinuses. Lung - Clear, basal rales, rhonchi, wheezes. Heart - S1, S2 heard, no murmurs, rubs, no pedal edema. Abdomen - Soft, non tender,  right nephrostomy tube Neuro - Alert, awake and oriented x 3, non focal exam. Skin - Warm and dry.  Data Reviewed:  Latest Ref Rng & Units 01/31/2024    5:51 AM 01/30/2024    4:44 AM 01/29/2024    4:15 AM  CBC  WBC 4.0 - 10.5 K/uL 8.2  9.7  8.1   Hemoglobin 12.0 - 15.0 g/dL 8.1  7.5  8.0   Hematocrit 36.0 - 46.0 % 24.9  23.0  24.1   Platelets 150 - 400 K/uL 668  622  618       Latest Ref Rng & Units 01/31/2024    5:51 AM 01/30/2024    4:44 AM 01/29/2024    4:15 AM  BMP  Glucose 70 - 99 mg/dL 91  96  161   BUN 8 - 23 mg/dL 14  14  9    Creatinine 0.44 - 1.00 mg/dL  0.96  0.45  4.09   Sodium 135 - 145 mmol/L 137  133  133   Potassium 3.5 - 5.1 mmol/L 4.6  4.6  3.6   Chloride 98 - 111 mmol/L 99  99  99   CO2 22 - 32 mmol/L 29  26  24    Calcium 8.9 - 10.3 mg/dL 9.6  9.1  8.9    No results found.   Family Communication: Discussed with patient, she understand and agree. All questions answered.  No family at bedside  Disposition: Status is: Inpatient Remains inpatient appropriate because: IV pain meds, s/p IR procedure follow cultures.  Possible discharge in next 2 to 3 days depending on pain control  Planned Discharge Destination: Home with Home Health     Time spent: 35 minutes  Author: Delfino Lovett, MD 01/31/2024 2:07 PM Secure chat 7am to 7pm For on call review [email or web address].

## 2024-01-31 NOTE — Plan of Care (Signed)
 Received report, sitting at bedside. Asking for pain med, informed not due till 2200, verbalized understanding. Tolerating fluids/meal. Up to BR ambulatory, denies dizziness. Assessment done.  2200 Sleeping when checked, given HS meds as ordered.  0010 Sleeping when checked. Rouses easily. Denies pain at this time.  0600 Ambulatory to door, asking for analgesic and given.  Slept most of night.

## 2024-01-31 NOTE — Plan of Care (Signed)
  Problem: Education: Goal: Knowledge of General Education information will improve Description: Including pain rating scale, medication(s)/side effects and non-pharmacologic comfort measures Outcome: Progressing   Problem: Health Behavior/Discharge Planning: Goal: Ability to manage health-related needs will improve Outcome: Progressing   Problem: Safety: Goal: Ability to remain free from injury will improve Outcome: Progressing   Problem: Skin Integrity: Goal: Risk for impaired skin integrity will decrease Outcome: Progressing   Problem: Urinary Elimination: Goal: Signs and symptoms of infection will decrease Outcome: Progressing   

## 2024-01-31 NOTE — Plan of Care (Signed)

## 2024-02-01 DIAGNOSIS — N1330 Unspecified hydronephrosis: Secondary | ICD-10-CM | POA: Diagnosis not present

## 2024-02-01 DIAGNOSIS — D75839 Thrombocytosis, unspecified: Secondary | ICD-10-CM | POA: Diagnosis not present

## 2024-02-01 DIAGNOSIS — K219 Gastro-esophageal reflux disease without esophagitis: Secondary | ICD-10-CM | POA: Diagnosis not present

## 2024-02-01 DIAGNOSIS — N12 Tubulo-interstitial nephritis, not specified as acute or chronic: Secondary | ICD-10-CM | POA: Diagnosis not present

## 2024-02-01 LAB — BASIC METABOLIC PANEL WITH GFR
Anion gap: 11 (ref 5–15)
BUN: 15 mg/dL (ref 8–23)
CO2: 28 mmol/L (ref 22–32)
Calcium: 9.4 mg/dL (ref 8.9–10.3)
Chloride: 92 mmol/L — ABNORMAL LOW (ref 98–111)
Creatinine, Ser: 0.89 mg/dL (ref 0.44–1.00)
GFR, Estimated: >60 mL/min (ref >60)
Glucose, Bld: 108 mg/dL — ABNORMAL HIGH (ref 70–99)
Potassium: 4.8 mmol/L (ref 3.5–5.1)
Sodium: 131 mmol/L — ABNORMAL LOW (ref 135–145)

## 2024-02-01 LAB — CBC
HCT: 22.8 % — ABNORMAL LOW (ref 36.0–46.0)
Hemoglobin: 7.6 g/dL — ABNORMAL LOW (ref 12.0–15.0)
MCH: 27.8 pg (ref 26.0–34.0)
MCHC: 33.3 g/dL (ref 30.0–36.0)
MCV: 83.5 fL (ref 80.0–100.0)
Platelets: 654 10*3/uL — ABNORMAL HIGH (ref 150–400)
RBC: 2.73 MIL/uL — ABNORMAL LOW (ref 3.87–5.11)
RDW: 14.6 % (ref 11.5–15.5)
WBC: 15.4 10*3/uL — ABNORMAL HIGH (ref 4.0–10.5)
nRBC: 0.2 % (ref 0.0–0.2)

## 2024-02-01 MED ORDER — ACETAMINOPHEN 325 MG PO TABS
650.0000 mg | ORAL_TABLET | Freq: Four times a day (QID) | ORAL | Status: DC | PRN
  Administered 2024-02-01: 650 mg via ORAL
  Filled 2024-02-01: qty 2

## 2024-02-01 NOTE — Progress Notes (Signed)
 No need to escalate, will not let me delete

## 2024-02-01 NOTE — Progress Notes (Signed)
 Progress Note   Patient: Belinda Oneill:[id number] DOB: 1961/06/20 DOA: 01/26/2024     6 DOS: the patient was seen and examined on 02/01/2024   Brief hospital course: Belinda Oneill is a 63 y.o. female with history of GERD, renal stones, recent percutaneous nephrostomy placement on 01/15/2024, being set discharged from the hospital on 01/20/2024 after hospitalization for pyelonephritis secondary to Proteus mirabilis and acute unilateral obstructive uropathy due to kidney stone.  She was discharged on Keflex to complete 2-week course of antibiotics. She presented to the hospital again on 01/26/2024 because of back pain and fever.  She was readmitted to the hospital for pyelonephritis. Urology advised IR eval. IR performed Nephrostogram with nephrostomy tube manipulation and exchange  4/5: Change IV Rocephin to oral Duricef, cutting back on Percocet from 2 tablets to 1 tablet, added trazodone for sleep 4/6: IR to evaluate nephrostomy tube tomorrow  Assessment and Plan: * Acute Right Pyelonephritis Persistent right large hydronephrosis, suspected urinomas or abscesses on CT abdomen and pelvis, nonobstructing left renal calculi: Right nephrostomy tube in place (placed on 01/15/2024 prior to admission). 4/2 IR performed Nephrostogram with nephrostomy tube manipulation and exchange removed purulent fluid. Follow cultures. Completed 6 days of IV ceftriaxone.  Switched to oral Duricef 4/6 her pain is somewhat better controlled continue pain control with Tylenol, IV Dilaudid and percocet  Hyponatremia- Chronic and stable.  Thrombocytosis Possibly reactive due to infection. Follow daily CBC.  Tobacco abuse Smoking cessation counseled. Nicotine patch PRN ordered  Chronic Anemia- Hb - 7.6. No active bleeding. She is on iron supplements. Continue constipation regimen. Monitor daily and transfuse if Hb less than 7.  GERD without esophagitis Continue famotidine 20 mg p.o. twice  daily.  Insomnia Continue trazodone.  Seem to help      Diet:  Diet Orders (From admission, onward)     Start     Ordered   01/28/24 1002  Diet regular Room service appropriate? Yes; Fluid consistency: Thin  Diet effective now       Question Answer Comment  Room service appropriate? Yes   Fluid consistency: Thin      01/28/24 1001           DVT prophylaxis: heparin injection 5,000 Units Start: 01/27/24 2200 Place TED hose Start: 01/26/24 1339  Level of care: Telemetry Surgical   Code Status: Full Code  Subjective: Patient is seen and examined today morning.  She continues to complain of pain with her nephrostomy tube.  Using her pain medicine around-the-clock.  Nurse flushed her tube without any issue.  Per nursing she had 25 cc drained from the tube last night  Physical Exam: Vitals:   01/31/24 1529 01/31/24 1933 02/01/24 0407 02/01/24 0743  BP: 92/62 129/88 97/65 (!) 103/59  Pulse: 95 88 95 (!) 105  Resp:  16 20 16   Temp: 98.4 F (36.9 C) 98.4 F (36.9 C) 98.2 F (36.8 C) 98.6 F (37 C)  TempSrc: Oral Oral Oral Oral  SpO2: 98% 99% 95% (!) 87%  Weight:      Height:        General - Elderly African American female, in distress due to pain. HEENT - PERRLA, EOMI, atraumatic head, non tender sinuses. Lung - Clear, basal rales, rhonchi, wheezes. Heart - S1, S2 heard, no murmurs, rubs, no pedal edema. Abdomen - Soft, non tender,  right nephrostomy tube Neuro - Alert, awake and oriented x 3, non focal exam. Skin - Warm and dry.  Data Reviewed:  Latest Ref Rng & Units 02/01/2024    4:11 AM 01/31/2024    5:51 AM 01/30/2024    4:44 AM  CBC  WBC 4.0 - 10.5 K/uL 15.4  8.2  9.7   Hemoglobin 12.0 - 15.0 g/dL 7.6  8.1  7.5   Hematocrit 36.0 - 46.0 % 22.8  24.9  23.0   Platelets 150 - 400 K/uL 654  668  622       Latest Ref Rng & Units 02/01/2024    4:11 AM 01/31/2024    5:51 AM 01/30/2024    4:44 AM  BMP  Glucose 70 - 99 mg/dL 161  91  96   BUN 8 - 23 mg/dL 15   14  14    Creatinine 0.44 - 1.00 mg/dL 0.96  0.45  4.09   Sodium 135 - 145 mmol/L 131  137  133   Potassium 3.5 - 5.1 mmol/L 4.8  4.6  4.6   Chloride 98 - 111 mmol/L 92  99  99   CO2 22 - 32 mmol/L 28  29  26    Calcium 8.9 - 10.3 mg/dL 9.4  9.6  9.1    No results found.   Family Communication: Discussed with patient, she understand and agree. All questions answered.  No family at bedside  Disposition: Status is: Inpatient Remains inpatient appropriate because: IV pain meds, s/p IR procedure follow cultures.  Possible discharge in next 2 to 3 days depending on pain control.  IR to reevaluate her nephrostomy tube tomorrow  Planned Discharge Destination: Home with Home Health     Time spent: 35 minutes  Author: Delfino Lovett, MD 02/01/2024 1:31 PM Secure chat 7am to 7pm For on call review [email or web address]

## 2024-02-01 NOTE — Progress Notes (Signed)
 Patient's nephrostomy tube came out when patient was ambulating to bathroom. Tip of nephrostomy tube intact.  Sutures removed from patient and foam dressing applied.  Dr. Sherryll Burger notified

## 2024-02-02 DIAGNOSIS — N12 Tubulo-interstitial nephritis, not specified as acute or chronic: Secondary | ICD-10-CM | POA: Diagnosis not present

## 2024-02-02 DIAGNOSIS — N200 Calculus of kidney: Secondary | ICD-10-CM | POA: Diagnosis not present

## 2024-02-02 DIAGNOSIS — N151 Renal and perinephric abscess: Secondary | ICD-10-CM | POA: Diagnosis not present

## 2024-02-02 DIAGNOSIS — D75839 Thrombocytosis, unspecified: Secondary | ICD-10-CM | POA: Diagnosis not present

## 2024-02-02 DIAGNOSIS — N1330 Unspecified hydronephrosis: Secondary | ICD-10-CM | POA: Diagnosis not present

## 2024-02-02 DIAGNOSIS — Z720 Tobacco use: Secondary | ICD-10-CM | POA: Diagnosis not present

## 2024-02-02 LAB — BASIC METABOLIC PANEL WITH GFR
Anion gap: 9 (ref 5–15)
BUN: 16 mg/dL (ref 8–23)
CO2: 29 mmol/L (ref 22–32)
Calcium: 9.7 mg/dL (ref 8.9–10.3)
Chloride: 93 mmol/L — ABNORMAL LOW (ref 98–111)
Creatinine, Ser: 0.93 mg/dL (ref 0.44–1.00)
GFR, Estimated: >60 mL/min (ref >60)
Glucose, Bld: 100 mg/dL — ABNORMAL HIGH (ref 70–99)
Potassium: 4.6 mmol/L (ref 3.5–5.1)
Sodium: 131 mmol/L — ABNORMAL LOW (ref 135–145)

## 2024-02-02 LAB — AEROBIC/ANAEROBIC CULTURE W GRAM STAIN (SURGICAL/DEEP WOUND)

## 2024-02-02 LAB — CBC
HCT: 24.6 % — ABNORMAL LOW (ref 36.0–46.0)
Hemoglobin: 8.1 g/dL — ABNORMAL LOW (ref 12.0–15.0)
MCH: 27 pg (ref 26.0–34.0)
MCHC: 32.9 g/dL (ref 30.0–36.0)
MCV: 82 fL (ref 80.0–100.0)
Platelets: 696 10*3/uL — ABNORMAL HIGH (ref 150–400)
RBC: 3 MIL/uL — ABNORMAL LOW (ref 3.87–5.11)
RDW: 14.7 % (ref 11.5–15.5)
WBC: 13.6 10*3/uL — ABNORMAL HIGH (ref 4.0–10.5)
nRBC: 0.1 % (ref 0.0–0.2)

## 2024-02-02 MED ORDER — CEFADROXIL 500 MG PO CAPS
1000.0000 mg | ORAL_CAPSULE | Freq: Two times a day (BID) | ORAL | Status: DC
  Administered 2024-02-02 – 2024-02-03 (×3): 1000 mg via ORAL
  Filled 2024-02-02 (×4): qty 2

## 2024-02-02 MED ORDER — HYDROMORPHONE HCL 1 MG/ML IJ SOLN
1.0000 mg | INTRAMUSCULAR | Status: DC | PRN
  Administered 2024-02-02 – 2024-02-05 (×13): 1 mg via INTRAVENOUS
  Filled 2024-02-02 (×13): qty 1

## 2024-02-02 NOTE — Progress Notes (Addendum)
 Progress Note   Patient: Belinda Oneill:[id number] DOB: 05/29/1961 DOA: 01/26/2024     7 DOS: the patient was seen and examined on 02/02/2024   Brief hospital course: Belinda Oneill is a 62 y.o. female with history of GERD, renal stones, recent percutaneous nephrostomy placement on 01/15/2024, being set discharged from the hospital on 01/20/2024 after hospitalization for pyelonephritis secondary to Proteus mirabilis and acute unilateral obstructive uropathy due to kidney stone.  She was discharged on Keflex to complete 2-week course of antibiotics. She presented to the hospital again on 01/26/2024 because of back pain and fever.  She was readmitted to the hospital for pyelonephritis. Urology advised IR eval. IR performed Nephrostogram with nephrostomy tube manipulation and exchange  4/5: Change IV Rocephin to oral Duricef, cutting back on Percocet from 2 tablets to 1 tablet, added trazodone for sleep 4/6: IR to evaluate nephrostomy tube tomorrow, patient pulled out nephrostomy tube later in the evening 4/7: Evaluation by IR and urology.  Plan for stenting tomorrow and no nephrostomy tube  Assessment and Plan: * Acute Right Pyelonephritis Persistent right large hydronephrosis, suspected urinomas or abscesses on CT abdomen and pelvis, nonobstructing left renal calculi: Right nephrostomy tube in place (placed on 01/15/2024 prior to admission). 4/2 IR performed Nephrostogram with nephrostomy tube manipulation and exchange removed purulent fluid. Follow cultures. Completed 6 days of IV ceftriaxone.  Switched to oral Duricef 4/6 Patient pulled out her nephrostomy tube last night.  Evaluated by urology and IR team.  No further nephrostomy tube placement by IR Urology planning stent placement tomorrow Continue Percocet and Dilaudid for pain control  Hyponatremia- Chronic and stable.  Thrombocytosis Possibly reactive due to infection. Monitor CBC.  Tobacco abuse Smoking cessation  counseled. Nicotine patch PRN ordered  Chronic Anemia- Hb - 8.1, No active bleeding. She is on iron supplements. Continue constipation regimen. Monitor daily and transfuse if Hb less than 7.  GERD without esophagitis Continue famotidine 20 mg p.o. twice daily.  Insomnia Continue trazodone.  Seem to help      Diet:  Diet Orders (From admission, onward)     Start     Ordered   02/02/24 1141  Diet regular Room service appropriate? Yes; Fluid consistency: Thin  Diet effective now       Question Answer Comment  Room service appropriate? Yes   Fluid consistency: Thin      02/02/24 1140           DVT prophylaxis: heparin injection 5,000 Units Start: 01/27/24 2200 Place TED hose Start: 01/26/24 1339  Level of care: Telemetry Surgical   Code Status: Full Code  Subjective: She pulled out her nephrostomy tube last night.  Her pain is controlled with her pain medication.  She is agreeable for stent placement by urology tomorrow.  Physical Exam: Vitals:   02/01/24 2030 02/02/24 0058 02/02/24 0350 02/02/24 0825  BP: 101/73 104/60 (!) 96/55 105/69  Pulse: 95 96 93 83  Resp: 16 16 16 16   Temp: 99.1 F (37.3 C) 98.2 F (36.8 C) 99.8 F (37.7 C) 98 F (36.7 C)  TempSrc: Oral Oral Oral Oral  SpO2: 99% 92% 97% 100%  Weight:      Height:        General - Elderly African American female, in distress due to pain. HEENT - PERRLA, EOMI, atraumatic head, non tender sinuses. Lung - Clear, basal rales, rhonchi, wheezes. Heart - S1, S2 heard, no murmurs, rubs, no pedal edema. Abdomen - Soft, non tender  Neuro - Alert, awake and oriented x 3, non focal exam. Skin - Warm and dry.  Data Reviewed:      Latest Ref Rng & Units 02/02/2024    4:03 AM 02/01/2024    4:11 AM 01/31/2024    5:51 AM  CBC  WBC 4.0 - 10.5 K/uL 13.6  15.4  8.2   Hemoglobin 12.0 - 15.0 g/dL 8.1  7.6  8.1   Hematocrit 36.0 - 46.0 % 24.6  22.8  24.9   Platelets 150 - 400 K/uL 696  654  668       Latest Ref  Rng & Units 02/02/2024    4:03 AM 02/01/2024    4:11 AM 01/31/2024    5:51 AM  BMP  Glucose 70 - 99 mg/dL 295  621  91   BUN 8 - 23 mg/dL 16  15  14    Creatinine 0.44 - 1.00 mg/dL 3.08  6.57  8.46   Sodium 135 - 145 mmol/L 131  131  137   Potassium 3.5 - 5.1 mmol/L 4.6  4.8  4.6   Chloride 98 - 111 mmol/L 93  92  99   CO2 22 - 32 mmol/L 29  28  29    Calcium 8.9 - 10.3 mg/dL 9.7  9.4  9.6    No results found.   Family Communication: Discussed with patient, she understand and agree. All questions answered.  No family at bedside  Disposition: Status is: Inpatient Remains inpatient appropriate because: IV pain meds, s/p IR procedure follow cultures.  Possible discharge in next 2 to 3 days depending on pain control.  Urology planning for stent placement tomorrow  Planned Discharge Destination: Home with Home Health     Time spent: 35 minutes  Author: Delfino Lovett, MD 02/02/2024 1:39 PM Secure chat 7am to 7pm For on call review [email or web address]

## 2024-02-02 NOTE — Progress Notes (Signed)
 Urology Inpatient Progress Note  Subjective: Nephrostomy tube was accidentally dislodged over the weekend.  Urology has been asked to reengage. Hemoglobin stable, 8.1.  WBC count down, 13.6.  Creatinine stable, 0.93.  Urine cultures growing pansensitive Proteus mirabilis, on antibiotics as below. She states her nephrostomy tube became especially uncomfortable last week after it was repositioned.  Anti-infectives: Anti-infectives (From admission, onward)    Start     Dose/Rate Route Frequency Ordered Stop   02/01/24 1000  cefadroxil (DURICEF) capsule 1,000 mg        1,000 mg Oral 2 times daily 01/31/24 1340 02/01/24 2137   01/27/24 1000  cefTRIAXone (ROCEPHIN) 2 g in sodium chloride 0.9 % 100 mL IVPB  Status:  Discontinued        2 g 200 mL/hr over 30 Minutes Intravenous Every 24 hours 01/26/24 1512 01/31/24 1340   01/26/24 1315  cefTRIAXone (ROCEPHIN) 2 g in sodium chloride 0.9 % 100 mL IVPB        2 g 200 mL/hr over 30 Minutes Intravenous  Once 01/26/24 1307 01/26/24 1358       Current Facility-Administered Medications  Medication Dose Route Frequency Provider Last Rate Last Admin   acetaminophen (TYLENOL) tablet 650 mg  650 mg Oral Q6H PRN Delfino Lovett, MD   650 mg at 02/01/24 1616   ALPRAZolam (XANAX) tablet 0.5 mg  0.5 mg Oral TID PRN Marcelino Duster, MD   0.5 mg at 02/01/24 0240   docusate sodium (COLACE) capsule 100 mg  100 mg Oral BID Marcelino Duster, MD   100 mg at 02/02/24 0830   famotidine (PEPCID) tablet 20 mg  20 mg Oral BID Cox, Amy N, DO   20 mg at 02/02/24 0830   feeding supplement (ENSURE ENLIVE / ENSURE PLUS) liquid 237 mL  237 mL Oral BID BM Sreeram, Narendranath, MD   237 mL at 02/02/24 0830   heparin injection 5,000 Units  5,000 Units Subcutaneous Q8H Cox, Amy N, DO   5,000 Units at 02/02/24 1610   HYDROmorphone (DILAUDID) injection 0.5 mg  0.5 mg Intravenous Q3H PRN Marcelino Duster, MD   0.5 mg at 02/02/24 0830   iron polysaccharides (NIFEREX)  capsule 150 mg  150 mg Oral Daily Lurene Shadow, MD   150 mg at 02/02/24 0836   nicotine (NICODERM CQ - dosed in mg/24 hours) patch 14 mg  14 mg Transdermal Daily PRN Cox, Amy N, DO       oxyCODONE-acetaminophen (PERCOCET/ROXICET) 5-325 MG per tablet 1 tablet  1 tablet Oral Q6H PRN Delfino Lovett, MD   1 tablet at 02/02/24 1127   polyethylene glycol (MIRALAX / GLYCOLAX) packet 17 g  17 g Oral Daily Marcelino Duster, MD   17 g at 02/02/24 0830   senna (SENOKOT) tablet 8.6 mg  1 tablet Oral BID Cox, Amy N, DO   8.6 mg at 02/02/24 0830   sodium chloride flush (NS) 0.9 % injection 5 mL  5 mL Intracatheter Q8H Richarda Overlie, MD   5 mL at 02/02/24 0521   traZODone (DESYREL) tablet 50 mg  50 mg Oral QHS Delfino Lovett, MD   50 mg at 02/01/24 2137     Objective: Vital signs in last 24 hours: Temp:  [98 F (36.7 C)-100 F (37.8 C)] 98 F (36.7 C) (04/07 0825) Pulse Rate:  [83-106] 83 (04/07 0825) Resp:  [16-18] 16 (04/07 0825) BP: (96-106)/(55-76) 105/69 (04/07 0825) SpO2:  [91 %-100 %] 100 % (04/07 0825)  Intake/Output from previous day:  04/06 0701 - 04/07 0700 In: 320 [P.O.:300] Out: 5 [Urine:5] Intake/Output this shift: No intake/output data recorded.  Physical Exam Vitals and nursing note reviewed.  Constitutional:      General: She is not in acute distress.    Appearance: She is not ill-appearing, toxic-appearing or diaphoretic.  HENT:     Head: Normocephalic and atraumatic.  Pulmonary:     Effort: Pulmonary effort is normal. No respiratory distress.  Skin:    General: Skin is warm and dry.  Neurological:     Mental Status: She is alert and oriented to person, place, and time.  Psychiatric:        Mood and Affect: Mood normal.        Behavior: Behavior normal.     Lab Results:  Recent Labs    02/01/24 0411 02/02/24 0403  WBC 15.4* 13.6*  HGB 7.6* 8.1*  HCT 22.8* 24.6*  PLT 654* 696*   BMET Recent Labs    02/01/24 0411 02/02/24 0403  NA 131* 131*  K 4.8 4.6  CL  92* 93*  CO2 28 29  GLUCOSE 108* 100*  BUN 15 16  CREATININE 0.89 0.93  CALCIUM 9.4 9.7   Assessment & Plan: 63 year old female s/p right nephrostomy tube placement and adjustment for management of right hydronephrosis/right renal abscess secondary to obstructing right renal pelvis/proximal right ureteral stones and pyelonephritis, now s/p right nephrostomy tube dislodgment.  She had a hard time tolerating her nephrostomy tube especially after it was adjusted by IR last week.  At this point, I recommended proceeding to the OR tomorrow for right ureteral stent placement to see if she tolerates this better.  We did discuss common stent symptoms including flank pain, bladder pain, dysuria, urgency, frequency, and gross hematuria.  She expressed understanding and wishes to proceed.  Recommendations: -Continue antibiotics per primary team - N.p.o. at midnight - Right nephrostomy tube placement with Dr. Apolinar Junes tomorrow  Carman Ching, PA-C 02/02/2024

## 2024-02-02 NOTE — Plan of Care (Signed)

## 2024-02-02 NOTE — H&P (View-Only) (Signed)
 Urology Inpatient Progress Note  Subjective: Nephrostomy tube was accidentally dislodged over the weekend.  Urology has been asked to reengage. Hemoglobin stable, 8.1.  WBC count down, 13.6.  Creatinine stable, 0.93.  Urine cultures growing pansensitive Proteus mirabilis, on antibiotics as below. She states her nephrostomy tube became especially uncomfortable last week after it was repositioned.  Anti-infectives: Anti-infectives (From admission, onward)    Start     Dose/Rate Route Frequency Ordered Stop   02/01/24 1000  cefadroxil (DURICEF) capsule 1,000 mg        1,000 mg Oral 2 times daily 01/31/24 1340 02/01/24 2137   01/27/24 1000  cefTRIAXone (ROCEPHIN) 2 g in sodium chloride 0.9 % 100 mL IVPB  Status:  Discontinued        2 g 200 mL/hr over 30 Minutes Intravenous Every 24 hours 01/26/24 1512 01/31/24 1340   01/26/24 1315  cefTRIAXone (ROCEPHIN) 2 g in sodium chloride 0.9 % 100 mL IVPB        2 g 200 mL/hr over 30 Minutes Intravenous  Once 01/26/24 1307 01/26/24 1358       Current Facility-Administered Medications  Medication Dose Route Frequency Provider Last Rate Last Admin   acetaminophen (TYLENOL) tablet 650 mg  650 mg Oral Q6H PRN Delfino Lovett, MD   650 mg at 02/01/24 1616   ALPRAZolam (XANAX) tablet 0.5 mg  0.5 mg Oral TID PRN Marcelino Duster, MD   0.5 mg at 02/01/24 0240   docusate sodium (COLACE) capsule 100 mg  100 mg Oral BID Marcelino Duster, MD   100 mg at 02/02/24 0830   famotidine (PEPCID) tablet 20 mg  20 mg Oral BID Cox, Amy N, DO   20 mg at 02/02/24 0830   feeding supplement (ENSURE ENLIVE / ENSURE PLUS) liquid 237 mL  237 mL Oral BID BM Sreeram, Narendranath, MD   237 mL at 02/02/24 0830   heparin injection 5,000 Units  5,000 Units Subcutaneous Q8H Cox, Amy N, DO   5,000 Units at 02/02/24 1610   HYDROmorphone (DILAUDID) injection 0.5 mg  0.5 mg Intravenous Q3H PRN Marcelino Duster, MD   0.5 mg at 02/02/24 0830   iron polysaccharides (NIFEREX)  capsule 150 mg  150 mg Oral Daily Lurene Shadow, MD   150 mg at 02/02/24 0836   nicotine (NICODERM CQ - dosed in mg/24 hours) patch 14 mg  14 mg Transdermal Daily PRN Cox, Amy N, DO       oxyCODONE-acetaminophen (PERCOCET/ROXICET) 5-325 MG per tablet 1 tablet  1 tablet Oral Q6H PRN Delfino Lovett, MD   1 tablet at 02/02/24 1127   polyethylene glycol (MIRALAX / GLYCOLAX) packet 17 g  17 g Oral Daily Marcelino Duster, MD   17 g at 02/02/24 0830   senna (SENOKOT) tablet 8.6 mg  1 tablet Oral BID Cox, Amy N, DO   8.6 mg at 02/02/24 0830   sodium chloride flush (NS) 0.9 % injection 5 mL  5 mL Intracatheter Q8H Richarda Overlie, MD   5 mL at 02/02/24 0521   traZODone (DESYREL) tablet 50 mg  50 mg Oral QHS Delfino Lovett, MD   50 mg at 02/01/24 2137     Objective: Vital signs in last 24 hours: Temp:  [98 F (36.7 C)-100 F (37.8 C)] 98 F (36.7 C) (04/07 0825) Pulse Rate:  [83-106] 83 (04/07 0825) Resp:  [16-18] 16 (04/07 0825) BP: (96-106)/(55-76) 105/69 (04/07 0825) SpO2:  [91 %-100 %] 100 % (04/07 0825)  Intake/Output from previous day:  04/06 0701 - 04/07 0700 In: 320 [P.O.:300] Out: 5 [Urine:5] Intake/Output this shift: No intake/output data recorded.  Physical Exam Vitals and nursing note reviewed.  Constitutional:      General: She is not in acute distress.    Appearance: She is not ill-appearing, toxic-appearing or diaphoretic.  HENT:     Head: Normocephalic and atraumatic.  Pulmonary:     Effort: Pulmonary effort is normal. No respiratory distress.  Skin:    General: Skin is warm and dry.  Neurological:     Mental Status: She is alert and oriented to person, place, and time.  Psychiatric:        Mood and Affect: Mood normal.        Behavior: Behavior normal.     Lab Results:  Recent Labs    02/01/24 0411 02/02/24 0403  WBC 15.4* 13.6*  HGB 7.6* 8.1*  HCT 22.8* 24.6*  PLT 654* 696*   BMET Recent Labs    02/01/24 0411 02/02/24 0403  NA 131* 131*  K 4.8 4.6  CL  92* 93*  CO2 28 29  GLUCOSE 108* 100*  BUN 15 16  CREATININE 0.89 0.93  CALCIUM 9.4 9.7   Assessment & Plan: 63 year old female s/p right nephrostomy tube placement and adjustment for management of right hydronephrosis/right renal abscess secondary to obstructing right renal pelvis/proximal right ureteral stones and pyelonephritis, now s/p right nephrostomy tube dislodgment.  She had a hard time tolerating her nephrostomy tube especially after it was adjusted by IR last week.  At this point, I recommended proceeding to the OR tomorrow for right ureteral stent placement to see if she tolerates this better.  We did discuss common stent symptoms including flank pain, bladder pain, dysuria, urgency, frequency, and gross hematuria.  She expressed understanding and wishes to proceed.  Recommendations: -Continue antibiotics per primary team - N.p.o. at midnight - Right nephrostomy tube placement with Dr. Apolinar Junes tomorrow  Carman Ching, PA-C 02/02/2024

## 2024-02-02 NOTE — Progress Notes (Signed)
 Referring Provider(s): Ms. Michiel Cowboy, PA-C  Supervising Physician: Malachy Moan  Patient Status:  [facility] - In-pt  Chief Complaint: Pyelonephritis, obstructive uropathy, right-sided.   Brief History:  Belinda Oneill was discharged from the hospital on 01/20/2024 after hospitalization on 3/19 for pyelonephritis secondary to Proteus mirabilis and acute right-sided obstructive nephrolithiasis due to renal pelvic 2.7 cm stone. Discharged home on PO antibiotics. She returned to ED on 3/31 with worsened back pain and fevers, and was readmitted for pyelonephritis. IR exchanged patient's drain on 4/2 due to pain and concerns with output. Yesterday evening, patient's drain was accidentally pulled out. Stitch removed, and dressing placed.  Subjective:  Patient is laying comfortably in bed on her left side during rounds.  Patient notes increased left flank pain, fevers. She is otherwise tearful at times, and shares she has nobody to discuss her care with outside of hospital staff.  She denies headache, chest pain, SOB, cough, abdominal pain, nausea, vomiting or bleeding.    Allergies: Patient has no known allergies.  Medications: Prior to Admission medications   Medication Sig Start Date End Date Taking? Authorizing Provider  acetaminophen (TYLENOL) 500 MG tablet Take 2 tablets (1,000 mg total) by mouth every 6 (six) hours as needed. 01/05/24 01/04/25  Gladys Damme, PA-C  famotidine (PEPCID) 20 MG tablet Take 1 tablet (20 mg total) by mouth 2 (two) times daily. 04/02/20 01/18/24  Miguel Aschoff., MD  iron polysaccharides (NIFEREX) 150 MG capsule Take 1 capsule (150 mg total) by mouth daily. 01/21/24   Marrion Coy, MD  oxyCODONE-acetaminophen (PERCOCET/ROXICET) 5-325 MG tablet Take 1-2 tablets by mouth every 4 (four) hours as needed for moderate pain (pain score 4-6). 01/20/24   Marrion Coy, MD  pantoprazole (PROTONIX) 40 MG tablet Take 1 tablet (40 mg total) by mouth daily. 08/09/19  08/08/20  Minna Antis, MD  senna-docusate (SENOKOT-S) 8.6-50 MG tablet Take 2 tablets by mouth 2 (two) times daily as needed for mild constipation. 01/20/24   Marrion Coy, MD  tamsulosin (FLOMAX) 0.4 MG CAPS capsule Take 1 capsule (0.4 mg total) by mouth daily for 14 days. 01/20/24 02/03/24  Marrion Coy, MD     Vital Signs: BP 105/69 (BP Location: Left Arm)   Pulse 83   Temp 98 F (36.7 C) (Oral)   Resp 16   Ht 5' 4.5" (1.638 m)   Wt 138 lb 14.2 oz (63 kg)   SpO2 100%   BMI 23.47 kg/m   Physical Exam Constitutional:      Appearance: Normal appearance.     Comments: Patient tearful.  Cardiovascular:     Rate and Rhythm: Normal rate.     Pulses: Normal pulses.  Pulmonary:     Effort: Pulmonary effort is normal.  Musculoskeletal:        General: Normal range of motion.     Comments: Right, lateral tenderness to palpation with light pressure at right flank, lateral and caudal to insertion site. Site is clean, appropriately dressed, without concerns for underlying fluctuance, crepitus, induration, not other concerning symptoms.  Skin:    General: Skin is warm and dry.  Neurological:     Mental Status: She is alert and oriented to person, place, and time.      Labs:  CBC: Recent Labs    01/30/24 0444 01/31/24 0551 02/01/24 0411 02/02/24 0403  WBC 9.7 8.2 15.4* 13.6*  HGB 7.5* 8.1* 7.6* 8.1*  HCT 23.0* 24.9* 22.8* 24.6*  PLT 622* 668* 654* 696*  COAGS: Recent Labs    01/15/24 0843  INR 1.3*  APTT 43*    BMP: Recent Labs    01/30/24 0444 01/31/24 0551 02/01/24 0411 02/02/24 0403  NA 133* 137 131* 131*  K 4.6 4.6 4.8 4.6  CL 99 99 92* 93*  CO2 26 29 28 29   GLUCOSE 96 91 108* 100*  BUN 14 14 15 16   CALCIUM 9.1 9.6 9.4 9.7  CREATININE 0.81 0.87 0.89 0.93  GFRNONAA >60 >60 >60 >60    LIVER FUNCTION TESTS: Recent Labs    01/05/24 1608 01/14/24 1549  BILITOT 0.6 0.6  AST 23 22  ALT 27 27  ALKPHOS 75 93  PROT 8.1 8.5*  ALBUMIN 3.1* 2.7*     Assessment and Plan: Discussed with patient that she has not tolerated her nephrostomy tube well. As it is out, patient will require new placement vs. New consideration for ureteral stent placement by Urology service. Advised patient to expect Urology service to see her today. As patient was not NPO this am, will maintain current diet and place NPO orders for possible procedure tomorrow, either with IR or Urology.  Interim: Urology Service consulted with patient, who is agreeable and consenting to ureteral right-sided stent placement by Urology tomorrow. NPO at midnight. Care team is made aware.  IR remains available to care team. Please call with questions or concerns.      Electronically Signed: Sable Feil, PA-C 02/02/2024, 2:08 PM     I spent a total of 15 Minutes at the the patient's bedside AND on the patient's hospital floor or unit, greater than 50% of which was counseling/coordinating care for pyelonephritis and obstructive uropathy.

## 2024-02-03 ENCOUNTER — Inpatient Hospital Stay: Payer: Self-pay

## 2024-02-03 ENCOUNTER — Inpatient Hospital Stay: Payer: Self-pay | Admitting: Certified Registered"

## 2024-02-03 ENCOUNTER — Encounter
Admission: EM | Disposition: A | Discharge status: Home / Self Care | Payer: Self-pay | Source: Home / Self Care | Attending: Internal Medicine

## 2024-02-03 DIAGNOSIS — N139 Obstructive and reflux uropathy, unspecified: Secondary | ICD-10-CM | POA: Diagnosis not present

## 2024-02-03 DIAGNOSIS — N1330 Unspecified hydronephrosis: Secondary | ICD-10-CM | POA: Diagnosis not present

## 2024-02-03 DIAGNOSIS — D75839 Thrombocytosis, unspecified: Secondary | ICD-10-CM | POA: Diagnosis not present

## 2024-02-03 DIAGNOSIS — Z720 Tobacco use: Secondary | ICD-10-CM | POA: Diagnosis not present

## 2024-02-03 DIAGNOSIS — N390 Urinary tract infection, site not specified: Secondary | ICD-10-CM | POA: Diagnosis not present

## 2024-02-03 DIAGNOSIS — N136 Pyonephrosis: Secondary | ICD-10-CM

## 2024-02-03 DIAGNOSIS — N12 Tubulo-interstitial nephritis, not specified as acute or chronic: Secondary | ICD-10-CM | POA: Diagnosis not present

## 2024-02-03 HISTORY — PX: CYSTOSCOPY WITH STENT PLACEMENT: SHX5790

## 2024-02-03 LAB — CBC
HCT: 24.9 % — ABNORMAL LOW (ref 36.0–46.0)
Hemoglobin: 8.3 g/dL — ABNORMAL LOW (ref 12.0–15.0)
MCH: 27.9 pg (ref 26.0–34.0)
MCHC: 33.3 g/dL (ref 30.0–36.0)
MCV: 83.8 fL (ref 80.0–100.0)
Platelets: 670 10*3/uL — ABNORMAL HIGH (ref 150–400)
RBC: 2.97 MIL/uL — ABNORMAL LOW (ref 3.87–5.11)
RDW: 14.5 % (ref 11.5–15.5)
WBC: 11.7 10*3/uL — ABNORMAL HIGH (ref 4.0–10.5)
nRBC: 0 % (ref 0.0–0.2)

## 2024-02-03 LAB — BASIC METABOLIC PANEL WITH GFR
Anion gap: 10 (ref 5–15)
BUN: 20 mg/dL (ref 8–23)
CO2: 27 mmol/L (ref 22–32)
Calcium: 9.5 mg/dL (ref 8.9–10.3)
Chloride: 96 mmol/L — ABNORMAL LOW (ref 98–111)
Creatinine, Ser: 0.83 mg/dL (ref 0.44–1.00)
GFR, Estimated: >60 mL/min (ref >60)
Glucose, Bld: 108 mg/dL — ABNORMAL HIGH (ref 70–99)
Potassium: 4.1 mmol/L (ref 3.5–5.1)
Sodium: 133 mmol/L — ABNORMAL LOW (ref 135–145)

## 2024-02-03 SURGERY — CYSTOSCOPY, WITH STENT INSERTION
Anesthesia: General | Site: Ureter | Laterality: Right

## 2024-02-03 MED ORDER — OXYCODONE HCL 5 MG/5ML PO SOLN: 5.0000 mg | Freq: Once | ORAL | Status: DC | PRN

## 2024-02-03 MED ORDER — LACTATED RINGERS IV SOLN: INTRAVENOUS | Status: DC | PRN

## 2024-02-03 MED ORDER — PROPOFOL 10 MG/ML IV BOLUS
INTRAVENOUS | Status: DC | PRN
  Administered 2024-02-03: 40 mg via INTRAVENOUS
  Administered 2024-02-03: 20 mg via INTRAVENOUS

## 2024-02-03 MED ORDER — MIDAZOLAM HCL 2 MG/2ML IJ SOLN
INTRAMUSCULAR | Status: AC
  Filled 2024-02-03: qty 2

## 2024-02-03 MED ORDER — IOHEXOL 180 MG/ML  SOLN
INTRAMUSCULAR | Status: DC | PRN
  Administered 2024-02-03: 10 mL

## 2024-02-03 MED ORDER — PROPOFOL 1000 MG/100ML IV EMUL
INTRAVENOUS | Status: AC
  Filled 2024-02-03: qty 100

## 2024-02-03 MED ORDER — PROPOFOL 10 MG/ML IV BOLUS
INTRAVENOUS | Status: AC
  Filled 2024-02-03: qty 20

## 2024-02-03 MED ORDER — MIDAZOLAM HCL 2 MG/2ML IJ SOLN
INTRAMUSCULAR | Status: DC | PRN
Start: 2024-02-03 — End: 2024-02-03
  Administered 2024-02-03: 2 mg via INTRAVENOUS

## 2024-02-03 MED ORDER — OXYCODONE HCL 5 MG PO TABS: 5.0000 mg | ORAL_TABLET | Freq: Once | ORAL | Status: DC | PRN

## 2024-02-03 MED ORDER — OXYCODONE-ACETAMINOPHEN 5-325 MG PO TABS
1.0000 | ORAL_TABLET | ORAL | Status: DC | PRN
  Administered 2024-02-03 – 2024-02-06 (×8): 1 via ORAL
  Filled 2024-02-03 (×8): qty 1

## 2024-02-03 MED ORDER — LEVOFLOXACIN 750 MG PO TABS
750.0000 mg | ORAL_TABLET | Freq: Every day | ORAL | Status: DC
  Administered 2024-02-03 – 2024-02-04 (×2): 750 mg via ORAL
  Filled 2024-02-03 (×2): qty 1

## 2024-02-03 MED ORDER — KETOROLAC TROMETHAMINE 30 MG/ML IJ SOLN
INTRAMUSCULAR | Status: DC | PRN
  Administered 2024-02-03: 15 mg via INTRAVENOUS

## 2024-02-03 MED ORDER — FENTANYL CITRATE (PF) 100 MCG/2ML IJ SOLN
INTRAMUSCULAR | Status: DC | PRN
  Administered 2024-02-03: 50 ug via INTRAVENOUS

## 2024-02-03 MED ORDER — FENTANYL CITRATE (PF) 100 MCG/2ML IJ SOLN
INTRAMUSCULAR | Status: AC
  Filled 2024-02-03: qty 2

## 2024-02-03 MED ORDER — ACETAMINOPHEN 10 MG/ML IV SOLN: 1000.0000 mg | Freq: Once | INTRAVENOUS | Status: DC | PRN

## 2024-02-03 MED ORDER — FENTANYL CITRATE (PF) 100 MCG/2ML IJ SOLN: 25.0000 ug | INTRAMUSCULAR | Status: DC | PRN

## 2024-02-03 MED ORDER — PROPOFOL 500 MG/50ML IV EMUL
INTRAVENOUS | Status: DC | PRN
  Administered 2024-02-03: 150 ug/kg/min via INTRAVENOUS

## 2024-02-03 MED ORDER — SODIUM CHLORIDE 0.9 % IR SOLN
Status: DC | PRN
Start: 2024-02-03 — End: 2024-02-03
  Administered 2024-02-03: 3000 mL via INTRAVESICAL

## 2024-02-03 MED ORDER — ONDANSETRON HCL 4 MG/2ML IJ SOLN: 4.0000 mg | Freq: Once | INTRAMUSCULAR | Status: DC | PRN

## 2024-02-03 MED ORDER — ONDANSETRON HCL 4 MG/2ML IJ SOLN
INTRAMUSCULAR | Status: DC | PRN
  Administered 2024-02-03: 4 mg via INTRAVENOUS

## 2024-02-03 MED ORDER — LIDOCAINE HCL (PF) 2 % IJ SOLN
INTRAMUSCULAR | Status: AC
  Filled 2024-02-03: qty 5

## 2024-02-03 MED ORDER — DEXAMETHASONE SODIUM PHOSPHATE 10 MG/ML IJ SOLN
INTRAMUSCULAR | Status: DC | PRN
  Administered 2024-02-03: 4 mg via INTRAVENOUS

## 2024-02-03 MED ORDER — KETOROLAC TROMETHAMINE 30 MG/ML IJ SOLN
INTRAMUSCULAR | Status: AC
  Filled 2024-02-03: qty 1

## 2024-02-03 SURGICAL SUPPLY — 18 items
BAG DRAIN SIEMENS DORNER NS (MISCELLANEOUS) ×1 IMPLANT
BRUSH SCRUB EZ 4% CHG (MISCELLANEOUS) ×1 IMPLANT
CATH URETL OPEN 5X70 (CATHETERS) ×1 IMPLANT
GLOVE BIO SURGEON STRL SZ 6.5 (GLOVE) ×1 IMPLANT
GOWN STRL REUS W/ TWL LRG LVL3 (GOWN DISPOSABLE) ×2 IMPLANT
GUIDEWIRE ANG ZIPWIRE 035X150 (WIRE) IMPLANT
GUIDEWIRE STR DUAL SENSOR (WIRE) ×1 IMPLANT
KIT TURNOVER CYSTO (KITS) ×1 IMPLANT
PACK CYSTO AR (MISCELLANEOUS) ×1 IMPLANT
SET CYSTO W/LG BORE CLAMP LF (SET/KITS/TRAYS/PACK) ×1 IMPLANT
SOL .9 NS 3000ML IRR UROMATIC (IV SOLUTION) ×1 IMPLANT
STENT URET 6FRX24 CONTOUR (STENTS) IMPLANT
STENT URET 6FRX26 CONTOUR (STENTS) IMPLANT
SURGILUBE 2OZ TUBE FLIPTOP (MISCELLANEOUS) ×1 IMPLANT
SYR TOOMEY IRRIG 70ML (MISCELLANEOUS) ×1 IMPLANT
SYRINGE TOOMEY IRRIG 70ML (MISCELLANEOUS) ×1 IMPLANT
WATER STERILE IRR 1000ML POUR (IV SOLUTION) ×1 IMPLANT
WATER STERILE IRR 500ML POUR (IV SOLUTION) ×1 IMPLANT

## 2024-02-03 NOTE — Anesthesia Procedure Notes (Signed)
 Procedure Name: MAC Date/Time: 02/03/2024 8:15 AM  Performed by: Nelle Don, CRNAPre-anesthesia Checklist: Patient identified, Emergency Drugs available, Suction available and Patient being monitored Oxygen Delivery Method: Simple face mask

## 2024-02-03 NOTE — Interval H&P Note (Signed)
 History and Physical Interval Note:  02/03/2024 7:48 AM  Belinda Oneill  has presented today for surgery, with the diagnosis of Right ureteral calculus.  The various methods of treatment have been discussed with the patient and family. After consideration of risks, benefits and other options for treatment, the patient has consented to  Procedure(s): CYSTOSCOPY, WITH STENT INSERTION (Right) as a surgical intervention.  The patient's history has been reviewed, patient examined, no change in status, stable for surgery.  I have reviewed the patient's chart and labs.  Questions were answered to the patient's satisfaction.    RRR CTAB  Currnetly on abx   Vanna Scotland

## 2024-02-03 NOTE — Anesthesia Preprocedure Evaluation (Signed)
 Anesthesia Evaluation  Patient identified by MRN, date of birth, ID band Patient awake    Reviewed: Allergy & Precautions, NPO status , Patient's Chart, lab work & pertinent test results  History of Anesthesia Complications Negative for: history of anesthetic complications  Airway Mallampati: III  TM Distance: >3 FB Neck ROM: Full    Dental  (+) Poor Dentition, Chipped, Missing   Pulmonary neg sleep apnea, neg COPD, Patient abstained from smoking.Not current smoker, former smoker   Pulmonary exam normal breath sounds clear to auscultation       Cardiovascular Exercise Tolerance: Good METShypertension, Pt. on medications (-) CAD and (-) Past MI (-) dysrhythmias  Rhythm:Regular Rate:Normal - Systolic murmurs    Neuro/Psych negative neurological ROS  negative psych ROS   GI/Hepatic ,GERD  Controlled,,(+)     (-) substance abuse    Endo/Other  neg diabetes    Renal/GU Renal disease     Musculoskeletal   Abdominal   Peds  Hematology   Anesthesia Other Findings Past Medical History: 01/14/2024: Essential hypertension No date: Kidney stones No date: Kidney stones  Reproductive/Obstetrics                             Anesthesia Physical Anesthesia Plan  ASA: 2  Anesthesia Plan: General   Post-op Pain Management: Minimal or no pain anticipated and Ofirmev IV (intra-op)*   Induction: Intravenous  PONV Risk Score and Plan: 4 or greater and Propofol infusion, TIVA, Ondansetron, Midazolam and Dexamethasone  Airway Management Planned: Nasal Cannula  Additional Equipment: None  Intra-op Plan:   Post-operative Plan:   Informed Consent: I have reviewed the patients History and Physical, chart, labs and discussed the procedure including the risks, benefits and alternatives for the proposed anesthesia with the patient or authorized representative who has indicated his/her understanding and  acceptance.     Dental advisory given  Plan Discussed with: CRNA and Surgeon  Anesthesia Plan Comments: (Patient denies nausea/vomiting. NPO appropriate. Discussed risks of anesthesia with patient, including possibility of difficulty with spontaneous ventilation under anesthesia necessitating airway intervention, PONV, and rare risks such as cardiac or respiratory or neurological events, and allergic reactions. Discussed the role of CRNA in patient's perioperative care. Patient understands.)       Anesthesia Quick Evaluation

## 2024-02-03 NOTE — Progress Notes (Addendum)
 Progress Note   Patient: Belinda Oneill ZOX:[id number] DOB: [birth year] DOA: 01/26/2024     8 DOS: the patient was seen and examined on 02/03/2024   Brief hospital course: Samiksha ANNELLA PROWELL is a 63 y.o. female with history of GERD, renal stones, recent percutaneous nephrostomy placement on 01/15/2024, being set discharged from the hospital on 01/20/2024 after hospitalization for pyelonephritis secondary to Proteus mirabilis and acute unilateral obstructive uropathy due to kidney stone.  She was discharged on Keflex to complete 2-week course of antibiotics. She presented to the hospital again on 01/26/2024 because of back pain and fever.  She was readmitted to the hospital for pyelonephritis. Urology advised IR eval. IR performed Nephrostogram with nephrostomy tube manipulation and exchange  4/5: Change IV Rocephin to oral Duricef, cutting back on Percocet from 2 tablets to 1 tablet, added trazodone for sleep 4/6: IR to evaluate nephrostomy tube tomorrow, patient pulled out nephrostomy tube later in the evening 4/7: Evaluation by IR and urology.  Plan for stenting tomorrow and no nephrostomy tube 4/8: Status post cystoscopy and right ureteral stent placement  Assessment and Plan: * Acute Right Pyonephritis Persistent right large hydronephrosis, suspected urinomas or abscesses on CT abdomen and pelvis, nonobstructing left renal calculi: Right nephrostomy tube in place (placed on 01/15/2024 prior to admission). 4/2 IR performed Nephrostogram with nephrostomy tube manipulation and exchange removed purulent fluid. Follow cultures. Completed 6 days of IV ceftriaxone.  Switched to oral Duricef 4/6 Patient pulled out her nephrostomy tube last night.  Evaluated by urology and IR team.  No further nephrostomy tube placement by IR Status post cystoscopy, right retrograde pyelogram and right ureteral stent placement by Dr. Apolinar Junes on 4/8 Continue Percocet and Dilaudid for pain  control  Hyponatremia- Chronic and stable.  Thrombocytosis Possibly reactive due to infection. Monitor CBC.  Tobacco abuse Smoking cessation counseled. Nicotine patch PRN ordered  Chronic Anemia- Hb - 8.3, No active bleeding. She is on iron supplements. Continue constipation regimen. Monitor daily and transfuse if Hb less than 7.  GERD without esophagitis Continue famotidine 20 mg p.o. twice daily.  Insomnia Continue trazodone.        Diet:  Diet Orders (From admission, onward)     Start     Ordered   02/03/24 1002  Diet regular Fluid consistency: Thin  Diet effective 1000       Question:  Fluid consistency:  Answer:  Thin   02/03/24 1001           DVT prophylaxis: heparin injection 5,000 Units Start: 01/27/24 2200 Place TED hose Start: 01/26/24 1339  Level of care: Telemetry Surgical   Code Status: Full Code  Subjective: Minimal pain.  She is hungry and wants to eat.  Hoping to get her stenting done so that she can start eating  Physical Exam: Vitals:   02/03/24 0900 02/03/24 0907 02/03/24 0915 02/03/24 0936  BP: (!) 89/65 100/75 100/69 98/69  Pulse: 72 82 88 78  Resp: (!) 7 14 13 17   Temp:  98.1 F (36.7 C)  98.1 F (36.7 C)  TempSrc:    Oral  SpO2: 100% 97% 95% 95%  Weight:      Height:        General - Elderly African American female, in distress due to pain. HEENT - PERRLA, EOMI, atraumatic head, non tender sinuses. Lung - Clear, basal rales, rhonchi, wheezes. Heart - S1, S2 heard, no murmurs, rubs, no pedal edema. Abdomen - Soft, non tender Neuro - Alert,  awake and oriented x 3, non focal exam. Skin - Warm and dry.  Data Reviewed:      Latest Ref Rng & Units 02/03/2024    4:09 AM 02/02/2024    4:03 AM 02/01/2024    4:11 AM  CBC  WBC 4.0 - 10.5 K/uL 11.7  13.6  15.4   Hemoglobin 12.0 - 15.0 g/dL 8.3  8.1  7.6   Hematocrit 36.0 - 46.0 % 24.9  24.6  22.8   Platelets 150 - 400 K/uL 670  696  654       Latest Ref Rng & Units 02/03/2024     4:09 AM 02/02/2024    4:03 AM 02/01/2024    4:11 AM  BMP  Glucose 70 - 99 mg/dL 811  914  782   BUN 8 - 23 mg/dL 20  16  15    Creatinine 0.44 - 1.00 mg/dL 9.56  2.13  0.86   Sodium 135 - 145 mmol/L 133  131  131   Potassium 3.5 - 5.1 mmol/L 4.1  4.6  4.8   Chloride 98 - 111 mmol/L 96  93  92   CO2 22 - 32 mmol/L 27  29  28    Calcium 8.9 - 10.3 mg/dL 9.5  9.7  9.4    DG C-Arm 1-60 Min-No Report Result Date: 02/03/2024 Fluoroscopy was utilized by the requesting physician.  No radiographic interpretation.     Family Communication: Discussed with patient, she understand and agree. All questions answered.  No family at bedside  Disposition: Status is: Inpatient Remains inpatient appropriate because: IV pain meds, status post ureteral stent today.  Possible discharge in next 1-2 days depending on pain control.    Planned Discharge Destination: Home with Home Health     Time spent: 35 minutes  Author: Delfino Lovett, MD 02/03/2024 11:50 AM Secure chat 7am to 7pm For on call review [email or web address].

## 2024-02-03 NOTE — Anesthesia Postprocedure Evaluation (Signed)
 Anesthesia Post Note  Patient: Belinda Oneill  Procedure(s) Performed: CYSTOSCOPY, WITH STENT INSERTION (Right: Ureter)  Patient location during evaluation: PACU Anesthesia Type: General Level of consciousness: awake and alert Pain management: pain level controlled Vital Signs Assessment: post-procedure vital signs reviewed and stable Respiratory status: spontaneous breathing, nonlabored ventilation, respiratory function stable and patient connected to nasal cannula oxygen Cardiovascular status: blood pressure returned to baseline and stable Postop Assessment: no apparent nausea or vomiting Anesthetic complications: no   No notable events documented.   Last Vitals:  Vitals:   02/03/24 0915 02/03/24 0936  BP: 100/69 98/69  Pulse: 88 78  Resp: 13 17  Temp:  36.7 C  SpO2: 95% 95%    Last Pain:  Vitals:   02/03/24 1151  TempSrc:   PainSc: 9                  Corinda Gubler

## 2024-02-03 NOTE — Progress Notes (Signed)
 NAME: Belinda Oneill  DOB: 01-Jun-1961  MRN: [id number]  Date/Time: 02/03/2024 2:12 PM  REQUESTING PROVIDER: Dr.Shah Subjective:  REASON FOR CONSULT: Xanthogranulomatous pyelonephritis ? Belinda Oneill is a 63 y.o.female  with a history of renal calculi , was recently in [facility] 01/14/24-01/20/24 for Rt flank pain and diagnosed with multiple large stones, and causing severe hydronephrosis for which she got a nephrostomy tube and was discharged on Po keflex for proteus in urine culture presented  on  3/31 with fever and back pain  01/26/24 09:18  BP 110/80  Temp 97.9 F (36.6 C)  Pulse Rate 97  Resp 17  SpO2 99 %    Latest Reference Range & Units 01/26/24 09:20  WBC 4.0 - 10.5 K/uL 9.3  Hemoglobin 12.0 - 15.0 g/dL 8.9 (L)  HCT 78.2 - 95.6 % 27.7 (L)  Platelets 150 - 400 K/uL 656 (H)  Creatinine 0.44 - 1.00 mg/dL 2.13    CT abdomen and pelvis showed progression of hydronephrosis with multiple perinephric collections in the superior pole likely urinomas or abscesses Satrted on IV antibiotics after blood culture and urine culture sent. On 4/2/underwent nephrostomy tube manipulation and exchange by IR On 02/03/28 Urology performed cystoscopy and rt ureteral stent placement.tooth paste like material expressed from the rt ureter raising concern for XGP I am asked to se epatient for better antibiotic coverage. Pt is currently on cefadroxil for proteus     Past Medical History:  Diagnosis Date   Essential hypertension 01/14/2024   Kidney stones    Kidney stones     Past Surgical History:  Procedure Laterality Date   IR NEPHROSTOMY EXCHANGE RIGHT  01/28/2024   IR NEPHROSTOMY PLACEMENT RIGHT  01/15/2024   LITHOTRIPSY      Social History   Socioeconomic History   Marital status: Single    Spouse name: Not on file   Number of children: Not on file   Years of education: Not on file   Highest education level: Not on file  Occupational History   Not on file  Tobacco Use   Smoking status:  Every Day    Current packs/day: 0.25    Average packs/day: 0.3 packs/day for 40.0 years (10.0 ttl pk-yrs)    Types: Cigarettes   Smokeless tobacco: Never  Substance and Sexual Activity   Alcohol use: Yes    Comment: occ   Drug use: No   Sexual activity: Not Currently  Other Topics Concern   Not on file  Social History Narrative   Not on file   Social Drivers of Health   Financial Resource Strain: Not on file  Food Insecurity: No Food Insecurity (01/26/2024)   Hunger Vital Sign    Worried About Running Out of Food in the Last Year: Never true    Ran Out of Food in the Last Year: Never true  Transportation Needs: No Transportation Needs (01/26/2024)   PRAPARE - Administrator, Civil Service (Medical): No    Lack of Transportation (Non-Medical): No  Physical Activity: Not on file  Stress: Not on file  Social Connections: Unknown (01/14/2024)   Social Connection and Isolation Panel [NHANES]    Frequency of Communication with Friends and Family: More than three times a week    Frequency of Social Gatherings with Friends and Family: More than three times a week    Attends Religious Services: More than 4 times per year    Active Member of Clubs or Organizations: No    Attends  Club or Organization Meetings: Never    Marital Status: Not on file  Intimate Partner Violence: Not At Risk (01/26/2024)   Humiliation, Afraid, Rape, and Kick questionnaire    Fear of Current or Ex-Partner: No    Emotionally Abused: No    Physically Abused: No    Sexually Abused: No    Family History  Problem Relation Age of Onset   Breast cancer Maternal Aunt    No Known Allergies I? Current Facility-Administered Medications  Medication Dose Route Frequency Provider Last Rate Last Admin   acetaminophen (TYLENOL) tablet 650 mg  650 mg Oral Q6H PRN Delfino Lovett, MD   650 mg at 02/01/24 1616   ALPRAZolam (XANAX) tablet 0.5 mg  0.5 mg Oral TID PRN Marcelino Duster, MD   0.5 mg at 02/01/24  0240   docusate sodium (COLACE) capsule 100 mg  100 mg Oral BID Marcelino Duster, MD   100 mg at 02/03/24 1031   famotidine (PEPCID) tablet 20 mg  20 mg Oral BID Cox, Amy N, DO   20 mg at 02/03/24 1031   feeding supplement (ENSURE ENLIVE / ENSURE PLUS) liquid 237 mL  237 mL Oral BID BM Marcelino Duster, MD   237 mL at 02/03/24 1354   heparin injection 5,000 Units  5,000 Units Subcutaneous Q8H Cox, Amy N, DO   5,000 Units at 02/03/24 0734   HYDROmorphone (DILAUDID) injection 1 mg  1 mg Intravenous Q4H PRN Mansy, Jan A, MD   1 mg at 02/03/24 1151   iron polysaccharides (NIFEREX) capsule 150 mg  150 mg Oral Daily Lurene Shadow, MD   150 mg at 02/03/24 1035   levofloxacin (LEVAQUIN) tablet 750 mg  750 mg Oral Daily Lynn Ito, MD   750 mg at 02/03/24 1354   nicotine (NICODERM CQ - dosed in mg/24 hours) patch 14 mg  14 mg Transdermal Daily PRN Cox, Amy N, DO       oxyCODONE-acetaminophen (PERCOCET/ROXICET) 5-325 MG per tablet 1 tablet  1 tablet Oral Q6H PRN Delfino Lovett, MD   1 tablet at 02/02/24 1919   polyethylene glycol (MIRALAX / GLYCOLAX) packet 17 g  17 g Oral Daily Marcelino Duster, MD   17 g at 02/03/24 1031   senna (SENOKOT) tablet 8.6 mg  1 tablet Oral BID Cox, Amy N, DO   8.6 mg at 02/03/24 1031   traZODone (DESYREL) tablet 50 mg  50 mg Oral QHS Delfino Lovett, MD   50 mg at 02/02/24 2123     Abtx:  Anti-infectives (From admission, onward)    Start     Dose/Rate Route Frequency Ordered Stop   02/03/24 1415  levofloxacin (LEVAQUIN) tablet 750 mg        750 mg Oral Daily 02/03/24 1328     02/02/24 1445  cefadroxil (DURICEF) capsule 1,000 mg  Status:  Discontinued        1,000 mg Oral 2 times daily 02/02/24 1350 02/03/24 1326   02/01/24 1000  cefadroxil (DURICEF) capsule 1,000 mg        1,000 mg Oral 2 times daily 01/31/24 1340 02/01/24 2137   01/27/24 1000  cefTRIAXone (ROCEPHIN) 2 g in sodium chloride 0.9 % 100 mL IVPB  Status:  Discontinued        2 g 200  mL/hr over 30 Minutes Intravenous Every 24 hours 01/26/24 1512 01/31/24 1340   01/26/24 1315  cefTRIAXone (ROCEPHIN) 2 g in sodium chloride 0.9 % 100 mL IVPB  2 g 200 mL/hr over 30 Minutes Intravenous  Once 01/26/24 1307 01/26/24 1358       REVIEW OF SYSTEMS:  Const:  fever,  chills, negative weight loss Eyes: negative diplopia or visual changes, negative eye pain ENT: negative coryza, negative sore throat Resp: negative cough, hemoptysis, dyspnea Cards: negative for chest pain, palpitations, lower extremity edema GU: rt flank pain GI: Negative for abdominal pain, diarrhea, bleeding, constipation Skin: negative for rash and pruritus Heme: negative for easy bruising and gum/nose bleeding MS: negative for myalgias, arthralgias, back pain and muscle weakness Neurolo:negative for headaches, dizziness, vertigo, memory problems  Psych: negative for feelings of anxiety, depression  Endocrine: negative for thyroid, diabetes Allergy/Immunology- negative for any medication or food allergies ?  Objective:  VITALS:  BP 98/69 (BP Location: Right Arm)   Pulse 78   Temp 98.1 F (36.7 C) (Oral)   Resp 17   Ht 5' 4.5" (1.638 m)   Wt 63 kg   SpO2 95%   BMI 23.47 kg/m   PHYSICAL EXAM:  General: Alert, cooperative, no distress, pale appears stated age.  Head: Normocephalic, without obvious abnormality, atraumatic. Eyes: Conjunctivae clear, anicteric sclerae. Pupils are equal ENT Nares normal. No drainage or sinus tenderness. Lips, mucosa, and tongue normal. No Thrush Neck: Supple, symmetrical, no adenopathy, thyroid: non tender no carotid bruit and no JVD. Back:rt CVA tenderness Nephrostomy tube Lungs: Clear to auscultation bilaterally. No Wheezing or Rhonchi. No rales. Heart: Regular rate and rhythm, no murmur, rub or gallop. Abdomen: Soft, non-tender,not distended. Bowel sounds normal. No masses Extremities: atraumatic, no cyanosis. No edema. No clubbing Skin: No rashes or  lesions. Or bruising Lymph: Cervical, supraclavicular normal. Neurologic: Grossly non-focal Pertinent Labs Lab Results CBC    Component Value Date/Time   WBC 11.7 (H) 02/03/2024 0409   RBC 2.97 (L) 02/03/2024 0409   HGB 8.3 (L) 02/03/2024 0409   HGB 12.3 11/04/2013 0835   HCT 24.9 (L) 02/03/2024 0409   HCT 36.6 11/04/2013 0835   PLT 670 (H) 02/03/2024 0409   PLT 272 11/04/2013 0835   MCV 83.8 02/03/2024 0409   MCV 91 11/04/2013 0835   MCH 27.9 02/03/2024 0409   MCHC 33.3 02/03/2024 0409   RDW 14.5 02/03/2024 0409   RDW 13.7 11/04/2013 0835   LYMPHSABS 1.3 01/14/2024 1549   MONOABS 0.7 01/14/2024 1549   EOSABS 0.0 01/14/2024 1549   BASOSABS 0.0 01/14/2024 1549       Latest Ref Rng & Units 02/03/2024    4:09 AM 02/02/2024    4:03 AM 02/01/2024    4:11 AM  CMP  Glucose 70 - 99 mg/dL 409  811  914   BUN 8 - 23 mg/dL 20  16  15    Creatinine 0.44 - 1.00 mg/dL 7.82  9.56  2.13   Sodium 135 - 145 mmol/L 133  131  131   Potassium 3.5 - 5.1 mmol/L 4.1  4.6  4.8   Chloride 98 - 111 mmol/L 96  93  92   CO2 22 - 32 mmol/L 27  29  28    Calcium 8.9 - 10.3 mg/dL 9.5  9.7  9.4       Microbiology: Recent Results (from the past 240 hours)  Urine Culture     Status: None   Collection Time: 01/26/24 12:10 PM   Specimen: Urine, Random  Result Value Ref Range Status   Specimen Description   Final    URINE, RANDOM Performed at [facility], [address], [place], [place] [address]    Special Requests   Final    NONE Reflexed from [id number] Performed at [facility], [address] [place], [place] [address]    Culture   Final    NO GROWTH Performed at [facility], [address] [place], [place] [address]    Report Status 01/27/2024 FINAL  Final  Aerobic/Anaerobic Culture w Gram Stain (surgical/deep wound)     Status: None   Collection Time: 01/28/24  1:44 PM   Specimen: Abscess  Result Value Ref Range Status   Specimen Description   Final     ABSCESS Performed at [facility], [address], [place], [place] [address]    Special Requests   Final    NONE Performed at [facility], [address] [place], [place] [address]    Gram Stain   Final    FEW WBC PRESENT, PREDOMINANTLY PMN NO ORGANISMS SEEN    Culture   Final    RARE PROTEUS MIRABILIS NO ANAEROBES ISOLATED Performed at [facility], [address] [place], [place] [address]    Report Status 02/02/2024 FINAL  Final   Organism ID, Bacteria PROTEUS MIRABILIS  Final      Susceptibility   Proteus mirabilis - MIC*    AMPICILLIN <=2 SENSITIVE Sensitive     CEFEPIME <=0.12 SENSITIVE Sensitive     CEFTAZIDIME <=1 SENSITIVE Sensitive     CEFTRIAXONE <=0.25 SENSITIVE Sensitive     CIPROFLOXACIN <=0.25 SENSITIVE Sensitive     GENTAMICIN <=1 SENSITIVE Sensitive     IMIPENEM 4 SENSITIVE Sensitive     TRIMETH/SULFA <=20 SENSITIVE Sensitive     AMPICILLIN/SULBACTAM <=2 SENSITIVE Sensitive     PIP/TAZO <=4 SENSITIVE Sensitive ug/mL    * RARE PROTEUS MIRABILIS    IMAGING RESULTS:  I have personally reviewed the films ?severe rt hydronephrosis Multiple rt perinephric collections suggestive of urinomas or abscesses   Impression/Recommendation ?complicated UTI due to right obstructive nephropathy from ureteric calculi causing severe hydronephrosis, multiple peripnephric urinomas or abscesses or superior pole, pyonephrosis rt kidney S/p nphrostomy and rt ureteric stent 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 There is highly likely chance  of having nephrectomy to eradicate infection Sje will need atleast 4 weeks of antibiotic  Anemia  Thrombocytosis reactive  HTN ? ? Discussed the management with the patient and her daughter and care team

## 2024-02-03 NOTE — Transfer of Care (Signed)
 Immediate Anesthesia Transfer of Care Note  Patient: Ovetta KENNADEE WALTHOUR  Procedure(s) Performed: CYSTOSCOPY, WITH STENT INSERTION (Right: Ureter)  Patient Location: PACU  Anesthesia Type:MAC  Level of Consciousness: drowsy  Airway & Oxygen Therapy: Patient Spontanous Breathing and Patient connected to face mask oxygen  Post-op Assessment: Report given to RN, Post -op Vital signs reviewed and stable, and Patient moving all extremities X 4  Post vital signs: Reviewed and stable  Last Vitals:  Vitals Value Taken Time  BP 86/61   Temp    Pulse 77 02/03/24 0845  Resp 9 02/03/24 0845  SpO2 100 % 02/03/24 0845  Vitals shown include unfiled device data.  Last Pain:  Vitals:   02/03/24 0752  TempSrc:   PainSc: 7       Patients Stated Pain Goal: 0 (01/29/24 2020)  Complications: No notable events documented.

## 2024-02-03 NOTE — Op Note (Signed)
 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  Thick purulent drainage with right ureteral manipulation.  Some difficulty with stent placement but ultimately successful.  EBL: none  Specimens: none  Drains: 6 x 24 French double-J ureteral stent on right  Indication: Belinda Oneill is a 63 y.o. patient with severe hydronephrosis secondary to obstructing ureteral calculus, likely XGP kidney who has failed percutaneous nephrostomy tube secondary to intolerance and dislodgment.  She presents today for attempted right ureteral stent placement.  After reviewing the management options for treatment, she elected to proceed with the above surgical procedure(s). We have discussed the potential benefits and risks of the procedure, side effects of the proposed treatment, the likelihood of the patient achieving the goals of the procedure, and any potential problems that might occur during the procedure or recuperation. Informed consent has been obtained.  Description of procedure:  The patient was taken to the operating room and general anesthesia was induced.  The patient was placed in the dorsal lithotomy position, prepped and draped in the usual sterile fashion, and preoperative antibiotics were administered. A preoperative time-out was performed.   A 21 French cystoscope was advanced per urethra into the bladder.  The bladder appeared relatively normal with some anterior descent, albeit minimal.  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  Attention was turned to the right ureteral orifice.  There was purulent material just at the opening of the orifice.  The orifice was then cannulated using a 5 [place] open-ended ureteral catheter which advanced easily to the level of the mid ureter.  A retrograde pyelogram was then performed which showed multiple filling defects in the proximal ureter as well as renal pelvis with persistent severe hydronephrosis and somewhat irregular collecting system.  I then was able to with some mild difficulty navigate the wire up into the collecting system.  His 6 x 24 French double-J ureteral stent was then positioned such that the coil was in the renal pelvis as well as another coil in the bladder.  See PACS for imaging.  Notably, upon placement of the wire initially, there was first thin purulent material and with further manipulation, the material became thickened, purulent, almost toothpaste in quality.  This is consistent with ongoing pyelonephrosis.  Patient was then cleaned and dried after her bladder was empty, reversed from sedation and taken to the PACU in stable condition.  Plan: Continue oral antibiotics.  May be beneficial to have infectious disease weigh in on duration of antibiotics.  Ultimately, she will likely need a nephrectomy.  Vanna Scotland, M.D.

## 2024-02-03 NOTE — Consult Note (Incomplete)
 NAME: Belinda Oneill  DOB: 1961-01-15  MRN: [id number]  Date/Time: 02/03/2024 2:12 PM  REQUESTING PROVIDER: Dr.Shah Subjective:  REASON FOR CONSULT: Xanthogranulomatous pyelonephritis   Belinda Oneill is a 63 y.o.female  with a history of renal calculi , was recently in [facility] 01/14/24-01/20/24 for Rt flank pain and diagnosed with multiple large stones   ID   Steroid/immune suppressants/splenectomy/Hardware Recent Procedure Surgery Injections Trauma Sick contacts Travel Antibiotic use Food- raw/exotic Animal bites Tick exposure Water sports Fishing/hunting/animal bird exposure Past Medical History:  Diagnosis Date  . Essential hypertension 01/14/2024  . Kidney stones   . Kidney stones     Past Surgical History:  Procedure Laterality Date  . IR NEPHROSTOMY EXCHANGE RIGHT  01/28/2024  . IR NEPHROSTOMY PLACEMENT RIGHT  01/15/2024  . LITHOTRIPSY      Social History   Socioeconomic History  . Marital status: Single    Spouse name: Not on file  . Number of children: Not on file  . Years of education: Not on file  . Highest education level: Not on file  Occupational History  . Not on file  Tobacco Use  . Smoking status: Every Day    Current packs/day: 0.25    Average packs/day: 0.3 packs/day for 40.0 years (10.0 ttl pk-yrs)    Types: Cigarettes  . Smokeless tobacco: Never  Substance and Sexual Activity  . Alcohol use: Yes    Comment: occ  . Drug use: No  . Sexual activity: Not Currently  Other Topics Concern  . Not on file  Social History Narrative  . Not on file   Social Drivers of Health   Financial Resource Strain: Not on file  Food Insecurity: No Food Insecurity (01/26/2024)   Hunger Vital Sign   . Worried About Programme researcher, broadcasting/film/video in the Last Year: Never true   . Ran Out of Food in the Last Year: Never true  Transportation Needs: No Transportation Needs (01/26/2024)   PRAPARE - Transportation   . Lack of Transportation (Medical): No   . Lack of  Transportation (Non-Medical): No  Physical Activity: Not on file  Stress: Not on file  Social Connections: Unknown (01/14/2024)   Social Connection and Isolation Panel [NHANES]   . Frequency of Communication with Friends and Family: More than three times a week   . Frequency of Social Gatherings with Friends and Family: More than three times a week   . Attends Religious Services: More than 4 times per year   . Active Member of Clubs or Organizations: No   . Attends Banker Meetings: Never   . Marital Status: Not on file  Intimate Partner Violence: Not At Risk (01/26/2024)   Humiliation, Afraid, Rape, and Kick questionnaire   . Fear of Current or Ex-Partner: No   . Emotionally Abused: No   . Physically Abused: No   . Sexually Abused: No    Family History  Problem Relation Age of Onset  . Breast cancer Maternal Aunt    No Known Allergies I  Current Facility-Administered Medications  Medication Dose Route Frequency Provider Last Rate Last Admin  . acetaminophen (TYLENOL) tablet 650 mg  650 mg Oral Q6H PRN Delfino Lovett, MD   650 mg at 02/01/24 1616  . ALPRAZolam Prudy Feeler) tablet 0.5 mg  0.5 mg Oral TID PRN Marcelino Duster, MD   0.5 mg at 02/01/24 0240  . docusate sodium (COLACE) capsule 100 mg  100 mg Oral BID Marcelino Duster, MD   100 mg  at 02/03/24 1031  . famotidine (PEPCID) tablet 20 mg  20 mg Oral BID Cox, Amy N, DO   20 mg at 02/03/24 1031  . feeding supplement (ENSURE ENLIVE / ENSURE PLUS) liquid 237 mL  237 mL Oral BID BM Sreeram, Narendranath, MD   237 mL at 02/03/24 1354  . heparin injection 5,000 Units  5,000 Units Subcutaneous Q8H Cox, Amy N, DO   5,000 Units at 02/03/24 0734  . HYDROmorphone (DILAUDID) injection 1 mg  1 mg Intravenous Q4H PRN Mansy, Jan A, MD   1 mg at 02/03/24 1151  . iron polysaccharides (NIFEREX) capsule 150 mg  150 mg Oral Daily Lurene Shadow, MD   150 mg at 02/03/24 1035  . levofloxacin (LEVAQUIN) tablet 750 mg  750 mg Oral Daily  Lynn Ito, MD   750 mg at 02/03/24 1354  . nicotine (NICODERM CQ - dosed in mg/24 hours) patch 14 mg  14 mg Transdermal Daily PRN Cox, Amy N, DO      . oxyCODONE-acetaminophen (PERCOCET/ROXICET) 5-325 MG per tablet 1 tablet  1 tablet Oral Q6H PRN Delfino Lovett, MD   1 tablet at 02/02/24 1919  . polyethylene glycol (MIRALAX / GLYCOLAX) packet 17 g  17 g Oral Daily Marcelino Duster, MD   17 g at 02/03/24 1031  . senna (SENOKOT) tablet 8.6 mg  1 tablet Oral BID Cox, Amy N, DO   8.6 mg at 02/03/24 1031  . traZODone (DESYREL) tablet 50 mg  50 mg Oral QHS Delfino Lovett, MD   50 mg at 02/02/24 2123     Abtx:  Anti-infectives (From admission, onward)    Start     Dose/Rate Route Frequency Ordered Stop   02/03/24 1415  levofloxacin (LEVAQUIN) tablet 750 mg        750 mg Oral Daily 02/03/24 1328     02/02/24 1445  cefadroxil (DURICEF) capsule 1,000 mg  Status:  Discontinued        1,000 mg Oral 2 times daily 02/02/24 1350 02/03/24 1326   02/01/24 1000  cefadroxil (DURICEF) capsule 1,000 mg        1,000 mg Oral 2 times daily 01/31/24 1340 02/01/24 2137   01/27/24 1000  cefTRIAXone (ROCEPHIN) 2 g in sodium chloride 0.9 % 100 mL IVPB  Status:  Discontinued        2 g 200 mL/hr over 30 Minutes Intravenous Every 24 hours 01/26/24 1512 01/31/24 1340   01/26/24 1315  cefTRIAXone (ROCEPHIN) 2 g in sodium chloride 0.9 % 100 mL IVPB        2 g 200 mL/hr over 30 Minutes Intravenous  Once 01/26/24 1307 01/26/24 1358       REVIEW OF SYSTEMS:  Const: negative fever, negative chills, negative weight loss Eyes: negative diplopia or visual changes, negative eye pain ENT: negative coryza, negative sore throat Resp: negative cough, hemoptysis, dyspnea Cards: negative for chest pain, palpitations, lower extremity edema GU: negative for frequency, dysuria and hematuria GI: Negative for abdominal pain, diarrhea, bleeding, constipation Skin: negative for rash and pruritus Heme: negative for easy  bruising and gum/nose bleeding MS: negative for myalgias, arthralgias, back pain and muscle weakness Neurolo:negative for headaches, dizziness, vertigo, memory problems  Psych: negative for feelings of anxiety, depression  Endocrine: negative for thyroid, diabetes Allergy/Immunology- negative for any medication or food allergies   Pertinent Positives include : Objective:  VITALS:  BP 98/69 (BP Location: Right Arm)   Pulse 78   Temp 98.1 F (36.7 C) (Oral)  Resp 17   Ht 5' 4.5" (1.638 m)   Wt 63 kg   SpO2 95%   BMI 23.47 kg/m  LDA Foley Central line Other drainage tubes PHYSICAL EXAM:  General: Alert, cooperative, no distress, appears stated age.  Head: Normocephalic, without obvious abnormality, atraumatic. Eyes: Conjunctivae clear, anicteric sclerae. Pupils are equal ENT Nares normal. No drainage or sinus tenderness. Lips, mucosa, and tongue normal. No Thrush Neck: Supple, symmetrical, no adenopathy, thyroid: non tender no carotid bruit and no JVD. Back: No CVA tenderness. Lungs: Clear to auscultation bilaterally. No Wheezing or Rhonchi. No rales. Heart: Regular rate and rhythm, no murmur, rub or gallop. Abdomen: Soft, non-tender,not distended. Bowel sounds normal. No masses Extremities: atraumatic, no cyanosis. No edema. No clubbing Skin: No rashes or lesions. Or bruising Lymph: Cervical, supraclavicular normal. Neurologic: Grossly non-focal Pertinent Labs Lab Results CBC    Component Value Date/Time   WBC 11.7 (H) 02/03/2024 0409   RBC 2.97 (L) 02/03/2024 0409   HGB 8.3 (L) 02/03/2024 0409   HGB 12.3 11/04/2013 0835   HCT 24.9 (L) 02/03/2024 0409   HCT 36.6 11/04/2013 0835   PLT 670 (H) 02/03/2024 0409   PLT 272 11/04/2013 0835   MCV 83.8 02/03/2024 0409   MCV 91 11/04/2013 0835   MCH 27.9 02/03/2024 0409   MCHC 33.3 02/03/2024 0409   RDW 14.5 02/03/2024 0409   RDW 13.7 11/04/2013 0835   LYMPHSABS 1.3 01/14/2024 1549   MONOABS 0.7 01/14/2024 1549    EOSABS 0.0 01/14/2024 1549   BASOSABS 0.0 01/14/2024 1549       Latest Ref Rng & Units 02/03/2024    4:09 AM 02/02/2024    4:03 AM 02/01/2024    4:11 AM  CMP  Glucose 70 - 99 mg/dL 433  295  188   BUN 8 - 23 mg/dL 20  16  15    Creatinine 0.44 - 1.00 mg/dL 4.16  6.06  3.01   Sodium 135 - 145 mmol/L 133  131  131   Potassium 3.5 - 5.1 mmol/L 4.1  4.6  4.8   Chloride 98 - 111 mmol/L 96  93  92   CO2 22 - 32 mmol/L 27  29  28    Calcium 8.9 - 10.3 mg/dL 9.5  9.7  9.4       Microbiology: Recent Results (from the past 240 hours)  Urine Culture     Status: None   Collection Time: 01/26/24 12:10 PM   Specimen: Urine, Random  Result Value Ref Range Status   Specimen Description   Final    URINE, RANDOM Performed at [facility], [address] [place], [place] [address]    Special Requests   Final    NONE Reflexed from [id number] Performed at [facility], [address], [place], [place] [address]    Culture   Final    NO GROWTH Performed at [facility], [address], [place], [place] [address]    Report Status 01/27/2024 FINAL  Final  Aerobic/Anaerobic Culture w Gram Stain (surgical/deep wound)     Status: None   Collection Time: 01/28/24  1:44 PM   Specimen: Abscess  Result Value Ref Range Status   Specimen Description   Final    ABSCESS Performed at [facility], [address], [place], [place] [address]    Special Requests   Final    NONE Performed at [facility], [address], [place], [place] [address]  Gram Stain   Final    FEW WBC PRESENT, PREDOMINANTLY PMN NO ORGANISMS SEEN    Culture   Final    RARE PROTEUS MIRABILIS NO ANAEROBES ISOLATED Performed at [facility], [address] [place], [place] [address]    Report Status 02/02/2024 FINAL  Final   Organism ID, Bacteria PROTEUS MIRABILIS  Final      Susceptibility   Proteus mirabilis - MIC*    AMPICILLIN <=2 SENSITIVE Sensitive     CEFEPIME <=0.12  SENSITIVE Sensitive     CEFTAZIDIME <=1 SENSITIVE Sensitive     CEFTRIAXONE <=0.25 SENSITIVE Sensitive     CIPROFLOXACIN <=0.25 SENSITIVE Sensitive     GENTAMICIN <=1 SENSITIVE Sensitive     IMIPENEM 4 SENSITIVE Sensitive     TRIMETH/SULFA <=20 SENSITIVE Sensitive     AMPICILLIN/SULBACTAM <=2 SENSITIVE Sensitive     PIP/TAZO <=4 SENSITIVE Sensitive ug/mL    * RARE PROTEUS MIRABILIS    IMAGING RESULTS: I have personally reviewed the films   Impression/Recommendation       ___I have personally spent  ---minutes involved in face-to-face and non-face-to-face activities for this patient on the day of the visit. Professional time spent includes the following activities: Preparing to see the patient (review of tests), Obtaining and/or reviewing separately obtained history (admission/discharge record), Performing a medically appropriate examination and/or evaluation , Ordering medications/tests/procedures, referring and communicating with other health care professionals, Documenting clinical information in the EMR, Independently interpreting results (not separately reported), Communicating results to the patient/family/caregiver, Counseling and educating the patient/family/caregiver and Care coordination (not separately reported).    ________________________________________________ Discussed with patient, requesting provider Note:  This document was prepared using Dragon voice recognition software and may include unintentional dictation errors.

## 2024-02-03 NOTE — Plan of Care (Signed)

## 2024-02-04 ENCOUNTER — Encounter: Payer: Self-pay | Admitting: Urology

## 2024-02-04 DIAGNOSIS — N390 Urinary tract infection, site not specified: Secondary | ICD-10-CM | POA: Diagnosis not present

## 2024-02-04 DIAGNOSIS — N12 Tubulo-interstitial nephritis, not specified as acute or chronic: Secondary | ICD-10-CM | POA: Diagnosis not present

## 2024-02-04 DIAGNOSIS — N139 Obstructive and reflux uropathy, unspecified: Secondary | ICD-10-CM | POA: Diagnosis not present

## 2024-02-04 DIAGNOSIS — D75839 Thrombocytosis, unspecified: Secondary | ICD-10-CM | POA: Diagnosis not present

## 2024-02-04 LAB — BASIC METABOLIC PANEL WITH GFR
Anion gap: 10 (ref 5–15)
BUN: 25 mg/dL — ABNORMAL HIGH (ref 8–23)
CO2: 26 mmol/L (ref 22–32)
Calcium: 10 mg/dL (ref 8.9–10.3)
Chloride: 97 mmol/L — ABNORMAL LOW (ref 98–111)
Creatinine, Ser: 0.91 mg/dL (ref 0.44–1.00)
GFR, Estimated: >60 mL/min
Glucose, Bld: 118 mg/dL — ABNORMAL HIGH (ref 70–99)
Potassium: 5 mmol/L (ref 3.5–5.1)
Sodium: 133 mmol/L — ABNORMAL LOW (ref 135–145)

## 2024-02-04 LAB — CBC
HCT: 23 % — ABNORMAL LOW (ref 36.0–46.0)
Hemoglobin: 7.5 g/dL — ABNORMAL LOW (ref 12.0–15.0)
MCH: 27.6 pg (ref 26.0–34.0)
MCHC: 32.6 g/dL (ref 30.0–36.0)
MCV: 84.6 fL (ref 80.0–100.0)
Platelets: 656 K/uL — ABNORMAL HIGH (ref 150–400)
RBC: 2.72 MIL/uL — ABNORMAL LOW (ref 3.87–5.11)
RDW: 14.6 % (ref 11.5–15.5)
WBC: 10.9 K/uL — ABNORMAL HIGH (ref 4.0–10.5)
nRBC: 0 % (ref 0.0–0.2)

## 2024-02-04 MED ORDER — OXYBUTYNIN CHLORIDE 5 MG PO TABS
5.0000 mg | ORAL_TABLET | Freq: Three times a day (TID) | ORAL | Status: DC | PRN
  Administered 2024-02-04 – 2024-02-05 (×2): 5 mg via ORAL
  Filled 2024-02-04 (×2): qty 1

## 2024-02-04 MED ORDER — TAMSULOSIN HCL 0.4 MG PO CAPS
0.4000 mg | ORAL_CAPSULE | Freq: Every day | ORAL | Status: DC
  Administered 2024-02-04 – 2024-02-05 (×2): 0.4 mg via ORAL
  Filled 2024-02-04 (×2): qty 1

## 2024-02-04 NOTE — Plan of Care (Signed)

## 2024-02-04 NOTE — Progress Notes (Signed)
 Urology Consult Follow Up  Subjective: She underwent right ureteral stent placement yesterday with Dr. Apolinar Junes.  She states she is having right-sided tenderness and it feels like she is having abdominal cramping.  She states it feels like she is starting to have a very bad menstrual cycle, but she has not had her period in years.  She states she is having milky cloudy urine.  VSS afebrile   Serum creatinine is stable at 0.91.  Her WBC has decreased somewhat to 10.9 from 11.7 yesterday.    She is not wanting to go home today secondary to her pain and also anxiety about being readmitted.  Anti-infectives: Anti-infectives (From admission, onward)    Start     Dose/Rate Route Frequency Ordered Stop   02/03/24 1415  levofloxacin (LEVAQUIN) tablet 750 mg        750 mg Oral Daily 02/03/24 1328     02/02/24 1445  cefadroxil (DURICEF) capsule 1,000 mg  Status:  Discontinued        1,000 mg Oral 2 times daily 02/02/24 1350 02/03/24 1326   02/01/24 1000  cefadroxil (DURICEF) capsule 1,000 mg        1,000 mg Oral 2 times daily 01/31/24 1340 02/01/24 2137   01/27/24 1000  cefTRIAXone (ROCEPHIN) 2 g in sodium chloride 0.9 % 100 mL IVPB  Status:  Discontinued        2 g 200 mL/hr over 30 Minutes Intravenous Every 24 hours 01/26/24 1512 01/31/24 1340   01/26/24 1315  cefTRIAXone (ROCEPHIN) 2 g in sodium chloride 0.9 % 100 mL IVPB        2 g 200 mL/hr over 30 Minutes Intravenous  Once 01/26/24 1307 01/26/24 1358       Current Facility-Administered Medications  Medication Dose Route Frequency Provider Last Rate Last Admin   acetaminophen (TYLENOL) tablet 650 mg  650 mg Oral Q6H PRN Delfino Lovett, MD   650 mg at 02/01/24 1616   ALPRAZolam (XANAX) tablet 0.5 mg  0.5 mg Oral TID PRN Marcelino Duster, MD   0.5 mg at 02/03/24 2338   docusate sodium (COLACE) capsule 100 mg  100 mg Oral BID Marcelino Duster, MD   100 mg at 02/03/24 2113   famotidine (PEPCID) tablet 20 mg  20 mg Oral BID Cox,  Amy N, DO   20 mg at 02/03/24 2113   feeding supplement (ENSURE ENLIVE / ENSURE PLUS) liquid 237 mL  237 mL Oral BID BM Marcelino Duster, MD   237 mL at 02/03/24 1354   heparin injection 5,000 Units  5,000 Units Subcutaneous Q8H Cox, Amy N, DO   5,000 Units at 02/03/24 2335   HYDROmorphone (DILAUDID) injection 1 mg  1 mg Intravenous Q4H PRN Mansy, Jan A, MD   1 mg at 02/04/24 1610   iron polysaccharides (NIFEREX) capsule 150 mg  150 mg Oral Daily Lurene Shadow, MD   150 mg at 02/03/24 1035   levofloxacin (LEVAQUIN) tablet 750 mg  750 mg Oral Daily Lynn Ito, MD   750 mg at 02/03/24 1354   nicotine (NICODERM CQ - dosed in mg/24 hours) patch 14 mg  14 mg Transdermal Daily PRN Cox, Amy N, DO       oxyCODONE-acetaminophen (PERCOCET/ROXICET) 5-325 MG per tablet 1 tablet  1 tablet Oral Q4H PRN Delfino Lovett, MD   1 tablet at 02/03/24 1930   polyethylene glycol (MIRALAX / GLYCOLAX) packet 17 g  17 g Oral Daily Marcelino Duster, MD   17 g at  02/03/24 1031   senna (SENOKOT) tablet 8.6 mg  1 tablet Oral BID Cox, Amy N, DO   8.6 mg at 02/03/24 2113   traZODone (DESYREL) tablet 50 mg  50 mg Oral QHS Delfino Lovett, MD   50 mg at 02/03/24 2113     Objective: Vital signs in last 24 hours: Temp:  [97.5 F (36.4 C)-98.4 F (36.9 C)] 97.5 F (36.4 C) (04/09 0729) Pulse Rate:  [68-91] 73 (04/09 0729) Resp:  [7-18] 18 (04/09 0729) BP: (86-125)/(60-77) 110/71 (04/09 0729) SpO2:  [91 %-100 %] 98 % (04/09 0729)  Intake/Output from previous day: 04/08 0701 - 04/09 0700 In: 350 [I.V.:150] Out: -  Intake/Output this shift: No intake/output data recorded.   Physical Exam Vitals and nursing note reviewed.  Constitutional:      Appearance: Normal appearance. She is normal weight.  HENT:     Head: Normocephalic.     Nose: Nose normal.     Mouth/Throat:     Mouth: Mucous membranes are moist.     Pharynx: Oropharynx is clear.     Comments: Missing some teeth Eyes:     Extraocular  Movements: Extraocular movements intact.     Conjunctiva/sclera: Conjunctivae normal.     Pupils: Pupils are equal, round, and reactive to light.  Pulmonary:     Effort: Pulmonary effort is normal.  Abdominal:     General: Abdomen is flat.     Palpations: Abdomen is soft.     Tenderness: There is right CVA tenderness.     Comments: Nephrostomy dressing clean and dry  Musculoskeletal:        General: Normal range of motion.     Cervical back: Normal range of motion.  Skin:    General: Skin is warm and dry.  Neurological:     Mental Status: She is alert.     Lab Results:  Recent Labs    02/03/24 0409 02/04/24 0345  WBC 11.7* 10.9*  HGB 8.3* 7.5*  HCT 24.9* 23.0*  PLT 670* 656*   BMET Recent Labs    02/03/24 0409 02/04/24 0345  NA 133* 133*  K 4.1 5.0  CL 96* 97*  CO2 27 26  GLUCOSE 108* 118*  BUN 20 25*  CREATININE 0.83 0.91  CALCIUM 9.5 10.0   PT/INR No results for input(s): "LABPROT", "INR" in the last 72 hours. ABG No results for input(s): "PHART", "HCO3" in the last 72 hours.  Invalid input(s): "PCO2", "PO2"  Studies/Results: DG C-Arm 1-60 Min-No Report Result Date: 02/03/2024 Fluoroscopy was utilized by the requesting physician.  No radiographic interpretation.     Assessment: 63 year old female status post right nephrostomy tube placement and adjustment for management of right hydronephrosis/right XGP secondary to an obstructing right renal pelvis/proximal ureteral stones and pyelonephritis who experienced right nephrostomy tube dislodgment.  Patient ultimately pulled out the nephrostomy tube.  - She has been seen and evaluated by infectious disease and they have recommended 4 weeks of Levaquin 750 mg daily   -Serum creatinine is stable  -WBC count trending downwards   Plan: - Recommend tamsulosin 0.4 mg daily and oxybutynin IR 5 mg 3 times daily as needed for stent discomfort - Continue Levaquin 750 mg daily for 4 weeks - She will need  outpatient follow-up for ureteral stent management and likely undergo a right nephrectomy   LOS: 9 days    [facility] 02/04/2024

## 2024-02-04 NOTE — Plan of Care (Signed)

## 2024-02-04 NOTE — Progress Notes (Signed)
 Date of Admission:  01/26/2024      ID: Belinda Oneill is a 63 y.o. female    Principal Problem:   Pyelonephritis Active Problems:   GERD without esophagitis   Tobacco abuse   Thrombocytosis   Hydronephrosis of right kidney   Back pain    Subjective: Pt c/o abdominal pain and flank pain worse today  Medications:   docusate sodium  100 mg Oral BID   famotidine  20 mg Oral BID   feeding supplement  237 mL Oral BID BM   heparin  5,000 Units Subcutaneous Q8H   iron polysaccharides  150 mg Oral Daily   levofloxacin  750 mg Oral Daily   polyethylene glycol  17 g Oral Daily   senna  1 tablet Oral BID   tamsulosin  0.4 mg Oral QPC supper   traZODone  50 mg Oral QHS    Objective: Vital signs in last 24 hours: Patient Vitals for the past 24 hrs:  BP Temp Temp src Pulse Resp SpO2  02/04/24 1930 119/76 97.7 F (36.5 C) Oral 91 20 98 %  02/04/24 1500 107/79 98.3 F (36.8 C) Oral 81 18 99 %  02/04/24 0729 110/71 (!) 97.5 F (36.4 C) Oral 73 18 98 %  02/04/24 0430 100/74 -- -- 77 18 98 %  02/04/24 0355 125/77 -- -- 68 18 98 %      PHYSICAL EXAM:  General: awake but not comfortable   Back: rt CVA tenderness. Lungs: b/l air entry Heart:  s1s2 Abdomen: Soft, tender rt side Extremities: atraumatic, no cyanosis. No edema. No clubbing Skin: No rashes or lesions. Or bruising Lymph: Cervical, supraclavicular normal. Neurologic: Grossly non-focal  Lab Results    Latest Ref Rng & Units 02/04/2024    3:45 AM 02/03/2024    4:09 AM 02/02/2024    4:03 AM  CBC  WBC 4.0 - 10.5 K/uL 10.9  11.7  13.6   Hemoglobin 12.0 - 15.0 g/dL 7.5  8.3  8.1   Hematocrit 36.0 - 46.0 % 23.0  24.9  24.6   Platelets 150 - 400 K/uL 656  670  696        Latest Ref Rng & Units 02/04/2024    3:45 AM 02/03/2024    4:09 AM 02/02/2024    4:03 AM  CMP  Glucose 70 - 99 mg/dL 098  119  147   BUN 8 - 23 mg/dL 25  20  16    Creatinine 0.44 - 1.00 mg/dL 8.29  5.62  1.30   Sodium 135 - 145 mmol/L 133  133  131    Potassium 3.5 - 5.1 mmol/L 5.0  4.1  4.6   Chloride 98 - 111 mmol/L 97  96  93   CO2 22 - 32 mmol/L 26  27  29    Calcium 8.9 - 10.3 mg/dL 86.5  9.5  9.7       Microbiology:    Assessment/Plan: Complicated UTI due to right obstructive nephropathy from ureteric calculi causing severe hydronephrosis, multiple peripnephric urinomas or abscesses or superior pole, pyonephrosis rt kidney S/p nephrostomy and rt ureteric stent 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 ?  Discussed the management with the patient and care team

## 2024-02-04 NOTE — Progress Notes (Signed)
 Progress Note    RENDI MAPEL  NGE:[id number] DOB: 03/10/1961  DOA: 01/26/2024 PCP: Pcp, No      Brief Narrative:    Medical records reviewed and are as summarized below:  Belinda Oneill is a 63 y.o. female [name] is a 63 y.o. female with history of GERD, renal stones, recent percutaneous nephrostomy placement on 01/15/2024, being set discharged from the hospital on 01/20/2024 after hospitalization for pyelonephritis secondary to Proteus mirabilis and acute unilateral obstructive uropathy due to kidney stone.  She was discharged on Keflex to complete 2-week course of antibiotics. She presented to the hospital again on 01/26/2024 because of back pain and fever.   She was readmitted to the hospital for pyelonephritis.   Vitals in the ED showed temperature of 98.3, respiration rate 17, heart rate 97, blood pressure 110/80, SpO2 99% on room air.  Serum sodium is 136, potassium 3.6, chloride 101, bicarb 23, BUN of 9, serum creatinine 0.90, EGFR greater than 60, nonfasting blood glucose 125, WBC 9.3, hemoglobin 8.9, platelets of 656.  CT abdomen pelvis w contrast: Read as interval placement of a percutaneous right nephrostomy with pigtail tip in the region of ureteropelvic junction.  Interval progression of right hydronephrosis since prior CT.  Findings of right pyelonephritis and multiple perinephric collections adjacent to the superior pole of the right kidney, likely urinomas or abscesses.  ED treatment: Sodium chloride 1 L bolus, ceftriaxone 2 g IV one-time dose, oxycodone 5-325 mg p.o. one-time dose, Dilaudid 0.5 mg IV one-time dose.     Assessment/Plan:   Principal Problem:   Pyelonephritis Active Problems:   Thrombocytosis   Tobacco abuse   GERD without esophagitis   Hydronephrosis of right kidney   Back pain   Body mass index is 23.47 kg/m.   Acute right pyelonephritis: Completed 6 days of IV ceftriaxone.  Will switch to cefadroxil on 02/01/2024 for 2 days  and then started oral Levaquin on 02/03/2024.   ID recommended oral Levaquin for 4 weeks. Analgesics as needed for pain. Recent urine culture from 01/15/2024 showed Proteus mirabilis which was pansensitive.     Persistent right large hydronephrosis, suspected urinomas or abscesses on CT abdomen and pelvis, nonobstructing left renal calculi: S/p cystoscopy, right ureteral pyelogram and right ureteral stent placement on 02/03/2024 Patient pulled out right nephrostomy tube (placed on 01/15/2024 prior to admission) on 02/02/2024.  Nephrostomy tube was not replaced.   Urologist recommended tamsulosin 0.4 mg daily and oxybutynin IR 5 mg 3 times daily as needed for stent discomfort   Hyponatremia: Fluctuating but stable sodium levels.     Leukocytosis: Improved Thrombocytosis: Probably reactive.   Monitor CBC in the outpatient setting     Comorbidities include GERD, tobacco use disorder, chronic anemia, insomnia    Patient is concerned about going home today.  She is concerned about the overnight pain she experienced.  She prefers to stay another night in the hospital.  She does not want to go home and come back with complications just like what happened recently.   Diet Order             Diet regular Fluid consistency: Thin  Diet effective 1000                            Consultants: Urologist Interventional radiologist Infectious disease specialist  Procedures: Nephrostomy tube manipulation and exchange on 01/28/2024 Cystoscopy, right ureteral pyelogram and right ureteral stent  placement on 02/03/2024    Medications:    docusate sodium  100 mg Oral BID   famotidine  20 mg Oral BID   feeding supplement  237 mL Oral BID BM   heparin  5,000 Units Subcutaneous Q8H   iron polysaccharides  150 mg Oral Daily   levofloxacin  750 mg Oral Daily   polyethylene glycol  17 g Oral Daily   senna  1 tablet Oral BID   tamsulosin  0.4 mg Oral QPC supper   traZODone  50 mg Oral QHS    Continuous Infusions:   Anti-infectives (From admission, onward)    Start     Dose/Rate Route Frequency Ordered Stop   02/03/24 1415  levofloxacin (LEVAQUIN) tablet 750 mg        750 mg Oral Daily 02/03/24 1328     02/02/24 1445  cefadroxil (DURICEF) capsule 1,000 mg  Status:  Discontinued        1,000 mg Oral 2 times daily 02/02/24 1350 02/03/24 1326   02/01/24 1000  cefadroxil (DURICEF) capsule 1,000 mg        1,000 mg Oral 2 times daily 01/31/24 1340 02/01/24 2137   01/27/24 1000  cefTRIAXone (ROCEPHIN) 2 g in sodium chloride 0.9 % 100 mL IVPB  Status:  Discontinued        2 g 200 mL/hr over 30 Minutes Intravenous Every 24 hours 01/26/24 1512 01/31/24 1340   01/26/24 1315  cefTRIAXone (ROCEPHIN) 2 g in sodium chloride 0.9 % 100 mL IVPB        2 g 200 mL/hr over 30 Minutes Intravenous  Once 01/26/24 1307 01/26/24 1358              Family Communication/Anticipated D/C date and plan/Code Status   DVT prophylaxis: heparin injection 5,000 Units Start: 01/27/24 2200 Place TED hose Start: 01/26/24 1339     Code Status: Full Code  Family Communication: None Disposition Plan: Plan to discharge home   Status is: Inpatient Remains inpatient appropriate because: Pyelonephritis       Subjective:   Interval is noted.  She complains of right flank pain, right mid back pain and lower abdominal pain.  Objective:    Vitals:   02/03/24 1926 02/04/24 0355 02/04/24 0430 02/04/24 0729  BP: 108/75 125/77 100/74 110/71  Pulse: 79 68 77 73  Resp: 18 18 18 18   Temp: 97.9 F (36.6 C)   (!) 97.5 F (36.4 C)  TempSrc: Oral   Oral  SpO2: 96% 98% 98% 98%  Weight:      Height:       No data found.   Intake/Output Summary (Last 24 hours) at 02/04/2024 1229 Last data filed at 02/03/2024 1900 Gross per 24 hour  Intake 0 ml  Output --  Net 0 ml   Filed Weights   01/26/24 0918  Weight: 63 kg    Exam:  GEN: NAD SKIN: Warm and dry EYES: No pallor or icterus ENT:  MMM CV: RRR PULM: CTA B ABD: soft, ND, NT, +BS CNS: AAO x 3, non focal EXT: No edema or tenderness GU: Mild right CVA tenderness        Data Reviewed:   I have personally reviewed following labs and imaging studies:  Labs: Labs show the following:   Basic Metabolic Panel: Recent Labs  Lab 01/31/24 0551 02/01/24 0411 02/02/24 0403 02/03/24 0409 02/04/24 0345  NA 137 131* 131* 133* 133*  K 4.6 4.8 4.6 4.1 5.0  CL 99  92* 93* 96* 97*  CO2 29 28 29 27 26   GLUCOSE 91 108* 100* 108* 118*  BUN 14 15 16 20  25*  CREATININE 0.87 0.89 0.93 0.83 0.91  CALCIUM 9.6 9.4 9.7 9.5 10.0   GFR Estimated Creatinine Clearance: 55.8 mL/min (by C-G formula based on SCr of 0.91 mg/dL). Liver Function Tests: No results for input(s): "AST", "ALT", "ALKPHOS", "BILITOT", "PROT", "ALBUMIN" in the last 168 hours. No results for input(s): "LIPASE", "AMYLASE" in the last 168 hours. No results for input(s): "AMMONIA" in the last 168 hours. Coagulation profile No results for input(s): "INR", "PROTIME" in the last 168 hours.  CBC: Recent Labs  Lab 01/31/24 0551 02/01/24 0411 02/02/24 0403 02/03/24 0409 02/04/24 0345  WBC 8.2 15.4* 13.6* 11.7* 10.9*  HGB 8.1* 7.6* 8.1* 8.3* 7.5*  HCT 24.9* 22.8* 24.6* 24.9* 23.0*  MCV 83.0 83.5 82.0 83.8 84.6  PLT 668* 654* 696* 670* 656*   Cardiac Enzymes: No results for input(s): "CKTOTAL", "CKMB", "CKMBINDEX", "TROPONINI" in the last 168 hours. BNP (last 3 results) No results for input(s): "PROBNP" in the last 8760 hours. CBG: No results for input(s): "GLUCAP" in the last 168 hours. D-Dimer: No results for input(s): "DDIMER" in the last 72 hours. Hgb A1c: No results for input(s): "HGBA1C" in the last 72 hours. Lipid Profile: No results for input(s): "CHOL", "HDL", "LDLCALC", "TRIG", "CHOLHDL", "LDLDIRECT" in the last 72 hours. Thyroid function studies: No results for input(s): "TSH", "T4TOTAL", "T3FREE", "THYROIDAB" in the last 72  hours.  Invalid input(s): "FREET3" Anemia work up: No results for input(s): "VITAMINB12", "FOLATE", "FERRITIN", "TIBC", "IRON", "RETICCTPCT" in the last 72 hours. Sepsis Labs: Recent Labs  Lab 02/01/24 0411 02/02/24 0403 02/03/24 0409 02/04/24 0345  WBC 15.4* 13.6* 11.7* 10.9*    Microbiology Recent Results (from the past 240 hours)  Urine Culture     Status: None   Collection Time: 01/26/24 12:10 PM   Specimen: Urine, Random  Result Value Ref Range Status   Specimen Description   Final    URINE, RANDOM Performed at [facility], [address], [place], [place] [address]    Special Requests   Final    NONE Reflexed from [id number] Performed at [facility], [address], [place], [place] [address]    Culture   Final    NO GROWTH Performed at [facility], [address], [place], [place] [address]    Report Status 01/27/2024 FINAL  Final  Aerobic/Anaerobic Culture w Gram Stain (surgical/deep wound)     Status: None   Collection Time: 01/28/24  1:44 PM   Specimen: Abscess  Result Value Ref Range Status   Specimen Description   Final    ABSCESS Performed at [facility], [address] [place], [place] [address]    Special Requests   Final    NONE Performed at [facility], [address] [place], [place] [address]    Gram Stain   Final    FEW WBC PRESENT, PREDOMINANTLY PMN NO ORGANISMS SEEN    Culture   Final    RARE PROTEUS MIRABILIS NO ANAEROBES ISOLATED Performed at [facility], [address], [place], [place] [address]    Report Status 02/02/2024 FINAL  Final   Organism ID, Bacteria PROTEUS MIRABILIS  Final      Susceptibility   Proteus mirabilis - MIC*    AMPICILLIN <=2 SENSITIVE Sensitive     CEFEPIME <=0.12 SENSITIVE Sensitive     CEFTAZIDIME <=  1 SENSITIVE Sensitive     CEFTRIAXONE <=0.25 SENSITIVE Sensitive     CIPROFLOXACIN <=0.25 SENSITIVE Sensitive     GENTAMICIN <=1 SENSITIVE Sensitive      IMIPENEM 4 SENSITIVE Sensitive     TRIMETH/SULFA <=20 SENSITIVE Sensitive     AMPICILLIN/SULBACTAM <=2 SENSITIVE Sensitive     PIP/TAZO <=4 SENSITIVE Sensitive ug/mL    * RARE PROTEUS MIRABILIS    Procedures and diagnostic studies:  DG C-Arm 1-60 Min-No Report Result Date: 02/03/2024 Fluoroscopy was utilized by the requesting physician.  No radiographic interpretation.               LOS: 9 days   Laderius Valbuena  [facility] [employer] loss adjuster on [email or web address]. If 7PM-7AM, please contact night-coverage at [email or web address]     02/04/2024, 12:29 PM

## 2024-02-04 NOTE — TOC Progression Note (Signed)
 Transition of Care [facility]) - Progression Note    Patient Details  Name: Belinda Oneill MRN: [id number] Date of Birth: 1961-04-19  [facility] CM/SW Contact  Chapman Fitch, RN Phone Number: 02/04/2024, 9:27 AM  Clinical Narrative:      Per ID will need at least 4 weeks of oral antibiotics at discharge.  Plan for meds to bed at discharge       Expected Discharge Plan and Services                                               Social Determinants of Health (SDOH) Interventions SDOH Screenings   Food Insecurity: No Food Insecurity (01/26/2024)  Housing: Low Risk  (01/26/2024)  Transportation Needs: No Transportation Needs (01/26/2024)  Utilities: Not At Risk (01/26/2024)  Social Connections: Unknown (01/14/2024)  Tobacco Use: High Risk (01/26/2024)    Readmission Risk Interventions     No data to display

## 2024-02-05 DIAGNOSIS — N12 Tubulo-interstitial nephritis, not specified as acute or chronic: Secondary | ICD-10-CM | POA: Diagnosis not present

## 2024-02-05 MED ORDER — LEVOFLOXACIN 500 MG PO TABS
500.0000 mg | ORAL_TABLET | Freq: Every day | ORAL | Status: DC
  Administered 2024-02-05 – 2024-02-06 (×2): 500 mg via ORAL
  Filled 2024-02-05 (×2): qty 1

## 2024-02-05 NOTE — Plan of Care (Signed)

## 2024-02-05 NOTE — Progress Notes (Signed)
 Progress Note    Belinda Oneill  ZOX:[id number] DOB: 09-Sep-1961  DOA: 01/26/2024 PCP: Pcp, No      Brief Narrative:    Medical records reviewed and are as summarized below:  Belinda Oneill is a 63 y.o. female Belinda Oneill is a 63 y.o. female with history of GERD, renal stones, recent percutaneous nephrostomy placement on 01/15/2024, being set discharged from the hospital on 01/20/2024 after hospitalization for pyelonephritis secondary to Proteus mirabilis and acute unilateral obstructive uropathy due to kidney stone.  She was discharged on Keflex to complete 2-week course of antibiotics. She presented to the hospital again on 01/26/2024 because of back pain and fever.   She was readmitted to the hospital for pyelonephritis.   Vitals in the ED showed temperature of 98.3, respiration rate 17, heart rate 97, blood pressure 110/80, SpO2 99% on room air.  Serum sodium is 136, potassium 3.6, chloride 101, bicarb 23, BUN of 9, serum creatinine 0.90, EGFR greater than 60, nonfasting blood glucose 125, WBC 9.3, hemoglobin 8.9, platelets of 656.  CT abdomen pelvis w contrast: Read as interval placement of a percutaneous right nephrostomy with pigtail tip in the region of ureteropelvic junction.  Interval progression of right hydronephrosis since prior CT.  Findings of right pyelonephritis and multiple perinephric collections adjacent to the superior pole of the right kidney, likely urinomas or abscesses.  ED treatment: Sodium chloride 1 L bolus, ceftriaxone 2 g IV one-time dose, oxycodone 5-325 mg p.o. one-time dose, Dilaudid 0.5 mg IV one-time dose.     Assessment/Plan:   Principal Problem:   Pyelonephritis Active Problems:   Thrombocytosis   Tobacco abuse   GERD without esophagitis   Hydronephrosis of right kidney   Back pain   Obstructive uropathy   Complicated UTI (urinary tract infection)   Body mass index is 23.47 kg/m.   Acute right pyelonephritis: Completed 6 days  of IV ceftriaxone.  Antibiotics was switch to cefadroxil on 02/01/2024 for 2 days and then started oral Levaquin on 02/03/2024.  Continue Levaquin but decrease dose from 750 to 500 mg daily. ID recommended oral Levaquin for 3 to 4 weeks Analgesics as needed for pain. Recent urine culture from 01/15/2024 showed Proteus mirabilis which was pansensitive.     Persistent right large hydronephrosis, suspected urinomas or abscesses on CT abdomen and pelvis, nonobstructing left renal calculi: S/p cystoscopy, right ureteral pyelogram and right ureteral stent placement on 02/03/2024 Patient pulled out right nephrostomy tube (placed on 01/15/2024 prior to admission) on 02/02/2024.  Nephrostomy tube was not replaced.   Urologist recommended tamsulosin 0.4 mg daily and oxybutynin IR 5 mg 3 times daily as needed for stent discomfort   Hyponatremia: Fluctuating but stable sodium levels.     Leukocytosis: Improved Thrombocytosis: Probably reactive.   Monitor CBC in the outpatient setting     Comorbidities include GERD, tobacco use disorder, chronic anemia, insomnia    We discussed discharge plans.  Patient was informed that she was stable for discharge.  However, she said that she did not feel ready to go home today because she was still in a lot of pain.  I explained that urologist had signed off and she will see ID specialist and urologist as an outpatient for follow-up on 02/17/2024.  It was explained that we were going to use Tylenol and oxycodone as needed for pain control and the goal was to try and minimize the pain but it may be difficult to completely alleviate her  pain given the structural abnormality of the right kidney.  She said she was concerned that she was going to be home alone because her daughter is out of town today.  She also said that she did not have transportation to go home today.  I explained that transition of care team would be able to arrange transportation for her to go home but she  declined.  She has decided to spend another night in the hospital and plans to go home tomorrow.     Diet Order             Diet regular Fluid consistency: Thin  Diet effective 1000                            Consultants: Urologist Interventional radiologist Infectious disease specialist  Procedures: Nephrostomy tube manipulation and exchange on 01/28/2024 Cystoscopy, right ureteral pyelogram and right ureteral stent placement on 02/03/2024    Medications:    docusate sodium  100 mg Oral BID   famotidine  20 mg Oral BID   feeding supplement  237 mL Oral BID BM   heparin  5,000 Units Subcutaneous Q8H   iron polysaccharides  150 mg Oral Daily   levofloxacin  500 mg Oral Daily   polyethylene glycol  17 g Oral Daily   senna  1 tablet Oral BID   tamsulosin  0.4 mg Oral QPC supper   traZODone  50 mg Oral QHS   Continuous Infusions:   Anti-infectives (From admission, onward)    Start     Dose/Rate Route Frequency Ordered Stop   02/05/24 1000  levofloxacin (LEVAQUIN) tablet 500 mg        500 mg Oral Daily 02/05/24 0731     02/03/24 1415  levofloxacin (LEVAQUIN) tablet 750 mg  Status:  Discontinued        750 mg Oral Daily 02/03/24 1328 02/05/24 0731   02/02/24 1445  cefadroxil (DURICEF) capsule 1,000 mg  Status:  Discontinued        1,000 mg Oral 2 times daily 02/02/24 1350 02/03/24 1326   02/01/24 1000  cefadroxil (DURICEF) capsule 1,000 mg        1,000 mg Oral 2 times daily 01/31/24 1340 02/01/24 2137   01/27/24 1000  cefTRIAXone (ROCEPHIN) 2 g in sodium chloride 0.9 % 100 mL IVPB  Status:  Discontinued        2 g 200 mL/hr over 30 Minutes Intravenous Every 24 hours 01/26/24 1512 01/31/24 1340   01/26/24 1315  cefTRIAXone (ROCEPHIN) 2 g in sodium chloride 0.9 % 100 mL IVPB        2 g 200 mL/hr over 30 Minutes Intravenous  Once 01/26/24 1307 01/26/24 1358              Family Communication/Anticipated D/C date and plan/Code Status   DVT  prophylaxis: heparin injection 5,000 Units Start: 01/27/24 2200 Place TED hose Start: 01/26/24 1339     Code Status: Full Code  Family Communication: None Disposition Plan: Plan to discharge home   Status is: Inpatient Remains inpatient appropriate because: Pyelonephritis       Subjective:   Interval events noted.  She complains of right flank pain, right mid back pain and lower abdominal pain.  She said pain is quite severe and she does not feel comfortable going home today.  Objective:    Vitals:   02/04/24 1500 02/04/24 1930 02/05/24 0336 02/05/24 0737  BP:  107/79 119/76 99/71 102/61  Pulse: 81 91 90 96  Resp: 18 20 18 16   Temp: 98.3 F (36.8 C) 97.7 F (36.5 C) 99.5 F (37.5 C) 99.3 F (37.4 C)  TempSrc: Oral Oral Oral   SpO2: 99% 98% 97% 95%  Weight:      Height:       No data found.  No intake or output data in the 24 hours ending 02/05/24 1258  Filed Weights   01/26/24 0918  Weight: 63 kg    Exam:  GEN: NAD SKIN: Warm and dry EYES: No pallor or icterus ENT: MMM CV: RRR PULM: CTA B ABD: soft, ND, suprapubic tenderness without rebound tenderness or guarding, +BS CNS: AAO x 3, non focal EXT: No edema or tenderness GU: Right CVA tenderness         Data Reviewed:   I have personally reviewed following labs and imaging studies:  Labs: Labs show the following:   Basic Metabolic Panel: Recent Labs  Lab 01/31/24 0551 02/01/24 0411 02/02/24 0403 02/03/24 0409 02/04/24 0345  NA 137 131* 131* 133* 133*  K 4.6 4.8 4.6 4.1 5.0  CL 99 92* 93* 96* 97*  CO2 29 28 29 27 26   GLUCOSE 91 108* 100* 108* 118*  BUN 14 15 16 20  25*  CREATININE 0.87 0.89 0.93 0.83 0.91  CALCIUM 9.6 9.4 9.7 9.5 10.0   GFR Estimated Creatinine Clearance: 55.8 mL/min (by C-G formula based on SCr of 0.91 mg/dL). Liver Function Tests: No results for input(s): "AST", "ALT", "ALKPHOS", "BILITOT", "PROT", "ALBUMIN" in the last 168 hours. No results for input(s):  "LIPASE", "AMYLASE" in the last 168 hours. No results for input(s): "AMMONIA" in the last 168 hours. Coagulation profile No results for input(s): "INR", "PROTIME" in the last 168 hours.  CBC: Recent Labs  Lab 01/31/24 0551 02/01/24 0411 02/02/24 0403 02/03/24 0409 02/04/24 0345  WBC 8.2 15.4* 13.6* 11.7* 10.9*  HGB 8.1* 7.6* 8.1* 8.3* 7.5*  HCT 24.9* 22.8* 24.6* 24.9* 23.0*  MCV 83.0 83.5 82.0 83.8 84.6  PLT 668* 654* 696* 670* 656*   Cardiac Enzymes: No results for input(s): "CKTOTAL", "CKMB", "CKMBINDEX", "TROPONINI" in the last 168 hours. BNP (last 3 results) No results for input(s): "PROBNP" in the last 8760 hours. CBG: No results for input(s): "GLUCAP" in the last 168 hours. D-Dimer: No results for input(s): "DDIMER" in the last 72 hours. Hgb A1c: No results for input(s): "HGBA1C" in the last 72 hours. Lipid Profile: No results for input(s): "CHOL", "HDL", "LDLCALC", "TRIG", "CHOLHDL", "LDLDIRECT" in the last 72 hours. Thyroid function studies: No results for input(s): "TSH", "T4TOTAL", "T3FREE", "THYROIDAB" in the last 72 hours.  Invalid input(s): "FREET3" Anemia work up: No results for input(s): "VITAMINB12", "FOLATE", "FERRITIN", "TIBC", "IRON", "RETICCTPCT" in the last 72 hours. Sepsis Labs: Recent Labs  Lab 02/01/24 0411 02/02/24 0403 02/03/24 0409 02/04/24 0345  WBC 15.4* 13.6* 11.7* 10.9*    Microbiology Recent Results (from the past 240 hours)  Aerobic/Anaerobic Culture w Gram Stain (surgical/deep wound)     Status: None   Collection Time: 01/28/24  1:44 PM   Specimen: Abscess  Result Value Ref Range Status   Specimen Description   Final    ABSCESS Performed at [facility], [address] [place], [place] [address]    Special Requests   Final    NONE Performed at [facility], [address], [place], [place] [address]    Gram Stain   Final  FEW WBC PRESENT, PREDOMINANTLY PMN NO ORGANISMS SEEN    Culture   Final     RARE PROTEUS MIRABILIS NO ANAEROBES ISOLATED Performed at [facility], [address] [place], [place] [address]    Report Status 02/02/2024 FINAL  Final   Organism ID, Bacteria PROTEUS MIRABILIS  Final      Susceptibility   Proteus mirabilis - MIC*    AMPICILLIN <=2 SENSITIVE Sensitive     CEFEPIME <=0.12 SENSITIVE Sensitive     CEFTAZIDIME <=1 SENSITIVE Sensitive     CEFTRIAXONE <=0.25 SENSITIVE Sensitive     CIPROFLOXACIN <=0.25 SENSITIVE Sensitive     GENTAMICIN <=1 SENSITIVE Sensitive     IMIPENEM 4 SENSITIVE Sensitive     TRIMETH/SULFA <=20 SENSITIVE Sensitive     AMPICILLIN/SULBACTAM <=2 SENSITIVE Sensitive     PIP/TAZO <=4 SENSITIVE Sensitive ug/mL    * RARE PROTEUS MIRABILIS    Procedures and diagnostic studies:  No results found.              LOS: 10 days   Zacheriah Stumpe  [facility]   Pager on [email or web address]. If 7PM-7AM, please contact night-coverage at [email or web address]     02/05/2024, 12:58 PM

## 2024-02-06 ENCOUNTER — Other Ambulatory Visit: Payer: Self-pay

## 2024-02-06 DIAGNOSIS — N12 Tubulo-interstitial nephritis, not specified as acute or chronic: Secondary | ICD-10-CM | POA: Diagnosis not present

## 2024-02-06 MED ORDER — OXYCODONE-ACETAMINOPHEN 5-325 MG PO TABS
1.0000 | ORAL_TABLET | Freq: Three times a day (TID) | ORAL | 0 refills | Status: DC | PRN
  Filled 2024-02-06: qty 10, 4d supply, fill #0

## 2024-02-06 MED ORDER — TRAZODONE HCL 50 MG PO TABS
50.0000 mg | ORAL_TABLET | Freq: Every evening | ORAL | 0 refills | Status: DC | PRN
  Filled 2024-02-06: qty 7, 7d supply, fill #0

## 2024-02-06 MED ORDER — LEVOFLOXACIN 500 MG PO TABS
500.0000 mg | ORAL_TABLET | Freq: Every day | ORAL | 0 refills | Status: DC
Start: 2024-02-06 — End: 2024-02-12
  Filled 2024-02-06: qty 19, 19d supply, fill #0

## 2024-02-06 MED ORDER — TAMSULOSIN HCL 0.4 MG PO CAPS: 0.4000 mg | ORAL_CAPSULE | Freq: Every day | ORAL | Status: DC

## 2024-02-06 MED ORDER — OXYBUTYNIN CHLORIDE 5 MG PO TABS
5.0000 mg | ORAL_TABLET | Freq: Three times a day (TID) | ORAL | 0 refills | Status: DC | PRN
  Filled 2024-02-06: qty 30, 10d supply, fill #0

## 2024-02-06 NOTE — Discharge Summary (Signed)
 Physician Discharge Summary   Patient: Belinda Oneill MRN: [id number] DOB: 1961-06-08  Admit date:     01/26/2024  Discharge date: 02/06/24  Discharge Physician: Lurene Shadow   PCP: Pcp, No   Recommendations at discharge:   Follow-up with Dr. Rivka Safer, ID specialist, on 02/17/2024 at 11:30 AM. Follow-up with Dr. Apolinar Junes, urologist, on 02/17/2024, at 3:30 PM  Discharge Diagnoses: Principal Problem:   Pyelonephritis Active Problems:   Thrombocytosis   Tobacco abuse   GERD without esophagitis   Hydronephrosis of right kidney   Back pain   Obstructive uropathy   Complicated UTI (urinary tract infection)  Resolved Problems:   * No resolved hospital problems. *  Hospital Course:  Belinda Oneill is a 63 y.o. female Belinda Oneill is a 63 y.o. female with history of GERD, renal stones, recent percutaneous nephrostomy placement on 01/15/2024, being set discharged from the hospital on 01/20/2024 after hospitalization for pyelonephritis secondary to Proteus mirabilis and acute unilateral obstructive uropathy due to kidney stone.  She was discharged on Keflex to complete 2-week course of antibiotics. She presented to the hospital again on 01/26/2024 because of back pain and fever.   She was readmitted to the hospital for pyelonephritis.     Vitals in the ED showed temperature of 98.3, respiration rate 17, heart rate 97, blood pressure 110/80, SpO2 99% on room air.   Serum sodium is 136, potassium 3.6, chloride 101, bicarb 23, BUN of 9, serum creatinine 0.90, EGFR greater than 60, nonfasting blood glucose 125, WBC 9.3, hemoglobin 8.9, platelets of 656.   CT abdomen pelvis w contrast: Read as interval placement of a percutaneous right nephrostomy with pigtail tip in the region of ureteropelvic junction.  Interval progression of right hydronephrosis since prior CT.  Findings of right pyelonephritis and multiple perinephric collections adjacent to the superior pole of the right kidney,  likely urinomas or abscesses.   ED treatment: Sodium chloride 1 L bolus, ceftriaxone 2 g IV one-time dose, oxycodone 5-325 mg p.o. one-time dose, Dilaudid 0.5 mg IV one-time dose.      Assessment and Plan:   Acute right pyelonephritis: Completed 6 days of IV ceftriaxone.  Antibiotics was switch to cefadroxil on 02/01/2024 for 2 days and then started oral Levaquin on 02/03/2024.   She will be discharged on Levaquin 750 mg daily for 3 weeks based on ID specialist's recommendation. ID specialist will decide if she needs additional antibiotics at follow-up visit. Analgesics as needed for pain. Recent urine culture from 01/15/2024 showed Proteus mirabilis which was pansensitive.     Persistent right large hydronephrosis, suspected urinomas or abscesses on CT abdomen and pelvis, nonobstructing left renal calculi: S/p cystoscopy, right ureteral pyelogram and right ureteral stent placement on 02/03/2024 Patient pulled out right nephrostomy tube (placed on 01/15/2024 prior to admission) on 02/02/2024.  Nephrostomy tube was not replaced.   Urologist recommended tamsulosin 0.4 mg daily and oxybutynin IR 5 mg 3 times daily as needed for stent discomfort     Hyponatremia: Fluctuating but stable sodium levels.     Leukocytosis: Improved Thrombocytosis: Probably reactive.   Monitor CBC in the outpatient setting     Comorbidities include GERD, tobacco use disorder, chronic anemia, insomnia     Her condition has improved and she is deemed stable for discharge to home today.      Pain control - [employer] Controlled Substance Reporting System database was reviewed. and patient was instructed, not to drive, operate heavy machinery, perform activities at heights,  swimming or participation in water activities or provide baby-sitting services while on Pain, Sleep and Anxiety Medications; until their outpatient Physician has advised to do so again. Also recommended to not to take more than prescribed Pain,  Sleep and Anxiety Medications.  Consultants: Urologist, ID specialist, interventional radiologist Procedures performed: Nephrostomy tube manipulation and exchange on 01/28/2024 Cystoscopy, right ureteral pyelogram and right ureteral stent placement on 02/03/2024  Disposition: Home Diet recommendation:  Discharge Diet Orders (From admission, onward)     Start     Ordered   02/06/24 0000  Diet - low sodium heart healthy        02/06/24 0856           Cardiac diet DISCHARGE MEDICATION: Allergies as of 02/06/2024   No Known Allergies      Medication List     STOP taking these medications    cephALEXin 500 MG capsule Commonly known as: KEFLEX       TAKE these medications    acetaminophen 500 MG tablet Commonly known as: TYLENOL Take 2 tablets (1,000 mg total) by mouth every 6 (six) hours as needed.   famotidine 20 MG tablet Commonly known as: PEPCID Take 1 tablet (20 mg total) by mouth 2 (two) times daily.   Ferrex 150 150 MG capsule Generic drug: iron polysaccharides Take 1 capsule (150 mg total) by mouth daily.   levofloxacin 500 MG tablet Commonly known as: LEVAQUIN Take 1 tablet (500 mg total) by mouth daily.   oxybutynin 5 MG tablet Commonly known as: DITROPAN Take 1 tablet (5 mg total) by mouth every 8 (eight) hours as needed for bladder spasms.   oxyCODONE-acetaminophen 5-325 MG tablet Commonly known as: PERCOCET/ROXICET Take 1-2 tablets by mouth every 4 (four) hours as needed for moderate pain (pain score 4-6).   pantoprazole 40 MG tablet Commonly known as: Protonix Take 1 tablet (40 mg total) by mouth daily.   Stimulant Laxative 8.6-50 MG tablet Generic drug: senna-docusate Take 2 tablets by mouth 2 (two) times daily as needed for mild constipation.   tamsulosin 0.4 MG Caps capsule Commonly known as: FLOMAX Take 1 capsule (0.4 mg total) by mouth daily after supper. What changed: when to take this   traZODone 50 MG tablet Commonly known as:  DESYREL Take 1 tablet (50 mg total) by mouth at bedtime as needed for up to 7 days for sleep.        Discharge Exam: Filed Weights   01/26/24 0918  Weight: 63 kg   GEN: NAD SKIN: Warm and dry EYES: No pallor or icterus ENT: MMM CV: RRR PULM: CTA B ABD: soft, ND, NT, +BS CNS: AAO x 3, non focal EXT: No edema or tenderness GU: Mild right CVA tenderness  Condition at discharge: good  The results of significant diagnostics from this hospitalization (including imaging, microbiology, ancillary and laboratory) are listed below for reference.   Imaging Studies: DG C-Arm 1-60 Min-No Report Result Date: 02/03/2024 Fluoroscopy was utilized by the requesting physician.  No radiographic interpretation.   IR NEPHROSTOMY EXCHANGE RIGHT Result Date: 01/28/2024 INDICATION: 63 year old with obstructed right kidney secondary to stones. Concern for chronic infection. Percutaneous nephrostomy tube was placed on 01/15/2024. Follow-up imaging demonstrates persistent right hydronephrosis with new perinephric collections along the upper pole. Plan for nephrostogram through existing catheter with catheter exchange and manipulation. EXAM: 1. NEPHROSTOGRAM THROUGH EXISTING NEPHROSTOMY TUBE 2. NEPHROSTOMY TUBE EXCHANGE WITH FLUOROSCOPY Physician: Rachelle Hora. Henn, MD COMPARISON:  CT abdomen and pelvis 01/26/2024 MEDICATIONS: 1% lidocaine  for local anesthetic ANESTHESIA/SEDATION: None CONTRAST:  40mL OMNIPAQUE IOHEXOL 300 MG/ML SOLN - administered into the collecting system(s) FLUOROSCOPY: Radiation Exposure Index (as provided by the fluoroscopic device): 72 mGy Kerma COMPLICATIONS: None immediate. PROCEDURE: The procedure was explained to the patient. The risks and benefits of the procedure were discussed and the patient's questions were addressed. Informed consent was obtained from the patient. Patient was placed prone. The right nephrostomy tube and surrounding skin were prepped and draped in sterile fashion. Maximal  barrier sterile technique was utilized including caps, mask, sterile gowns, sterile gloves, sterile drape, hand hygiene and skin antiseptic. Contrast was injected through the existing nephrostomy tube. Yellow purulent fluid was aspirated from the collecting system. Skin around the nephrostomy tube was anesthetized using 1% lidocaine. Catheter was cut and removed over a Bentson wire. Kumpe catheter was manipulated into the upper pole calices and into the lower pole calices using a Bentson wire and superstiff Amplatz wire. Additional purulent fluid was aspirated. A new 12 [place] multipurpose drain was advanced over a wire into the renal collecting system and a large amount of thick purulent fluid was removed from both the upper and lower pole collecting systems. Collecting system was rigorously irrigated with sterile saline. Greater than 100 mL of purulent fluid was removed. 12 French drain was removed at one point in order to do more catheter manipulation within the collecting system. 12 French drain was replaced and the tip was placed near the renal pelvis extending into the lower pole collecting system. At one point, wire was able to be advanced down the right ureter. Catheter was sutured to skin and attached to a new gravity bag. Fluid was sent for culture. Fluoroscopic images were taken and saved for this procedure. FINDINGS: The nephrostogram demonstrated a very irregular collecting system related to complex purulent fluid within the collecting system. The initial nephrostogram demonstrated 2 areas of presumed contrast extravasation from the upper pole calices into the perinephric space. Suspect these are related to the new perinephric fluid collections. Greater than 100 mL purulent fluid was removed from the collecting system. Initially, the fluid was yellow in color. The residual fluid was bloody by the end of the procedure. Again noted are stones in the renal pelvis and proximal ureter. A wire did pass beyond  the stones in the right ureter. IMPRESSION: 1. Greater than 100 mL of purulent fluid was removed from the right renal collecting system after catheter manipulation and rigorous irrigation. 2. Successful right nephrostomy tube exchange using fluoroscopy. Patient has a 12 [place] multipurpose drain. 3. Bifid collecting system and the collecting systems are all communicating with each other. 4. Two areas are suspicious for contrast extravasation into the perinephric space along the upper pole. This likely explains the new perinephric fluid collections. Electronically Signed   By: Richarda Overlie M.D.   On: 01/28/2024 15:29   CT ABDOMEN PELVIS W CONTRAST Result Date: 01/26/2024 CLINICAL DATA:  History of kidney stone. Concern for pyelonephritis. EXAM: CT ABDOMEN AND PELVIS WITH CONTRAST TECHNIQUE: Multidetector CT imaging of the abdomen and pelvis was performed using the standard protocol following bolus administration of intravenous contrast. RADIATION DOSE REDUCTION: This exam was performed according to the departmental dose-optimization program which includes automated exposure control, adjustment of the mA and/or kV according to patient size and/or use of iterative reconstruction technique. CONTRAST:  OMNIPAQUE IOHEXOL 300 MG/ML  SOLN COMPARISON:  CT abdomen pelvis dated 01/05/2024. FINDINGS: Lower chest: Bibasilar linear and patchy atelectasis. Pneumonia is not excluded. Small  pericardial effusion measuring 4 mm in thickness. No intra-abdominal free air or free fluid. Hepatobiliary: The liver is unremarkable. No biliary dilatation. Layering stones in the gallbladder. No pericholecystic fluid or evidence of acute cholecystitis by CT. Pancreas: Unremarkable. No pancreatic ductal dilatation or surrounding inflammatory changes. Spleen: Normal in size without focal abnormality. Adrenals/Urinary Tract: The adrenal glands are unremarkable. Several nonobstructing left renal calculi measure up to 5 mm in the upper pole.  No hydronephrosis on the left. Multiple stones in the proximal right ureter with the largest measuring approximately 12 mm in length. There has been interval placement of a percutaneous right nephrostomy with pigtail tip in the region of the ureteropelvic junction. Interval progression of right hydronephrosis since the prior CT. Several loculated perinephric collections along the upper pole of the right kidney suspicious for urinomas in the setting of caliceal rupture and possible superimposed infection. The largest loculated collection measures approximately 3 x 5 cm. There is slight haziness of the right renal parenchyma and mild perinephric stranding suspicious for pyelonephritis. The urinary bladder is grossly unremarkable. Stomach/Bowel: There is moderate stool throughout the colon. There is no bowel obstruction or active inflammation. The appendix is not visualized with certainty. No inflammatory changes identified in the right lower quadrant. Vascular/Lymphatic: Moderate aortoiliac atherosclerotic disease. The IVC is unremarkable. No portal venous gas. There is no adenopathy. Reproductive: The uterus is retroverted and grossly unremarkable. No suspicious adnexal masses. Other: None Musculoskeletal: No acute or significant osseous findings. IMPRESSION: 1. Interval placement of a percutaneous right nephrostomy with pigtail tip in the region of the ureteropelvic junction. Interval progression of right hydronephrosis since the prior CT. 2. Findings of right pyelonephritis and multiple perinephric collections adjacent to the superior pole of the right kidney, likely urinomas or abscesses. 3. Nonobstructing left renal calculi. No hydronephrosis on the left. 4. Cholelithiasis. 5. No bowel obstruction. Electronically Signed   By: Elgie Collard M.D.   On: 01/26/2024 12:22   IR NEPHROSTOMY PLACEMENT RIGHT Result Date: 01/15/2024 CLINICAL DATA:  Obstructed right kidney secondary to central and proximal ureteral  calculi. Suspected chronic obstruction with infection. EXAM: 1. ULTRASOUND GUIDANCE FOR PUNCTURE OF THE RIGHT RENAL COLLECTING SYSTEM. 2. RIGHT PERCUTANEOUS NEPHROSTOMY TUBE PLACEMENT. COMPARISON:  None Available. ANESTHESIA/SEDATION: Moderate (conscious) sedation was employed during this procedure. A total of Versed 2.5 mg and Fentanyl 125 mcg was administered intravenously. Moderate Sedation Time: 25 minutes. The patient's level of consciousness and vital signs were monitored continuously by radiology nursing throughout the procedure under my direct supervision. CONTRAST:  12 mL Omnipaque 300 MEDICATIONS: A scheduled dose of 2 g of IV Rocephin was given just prior to the procedure. FLUOROSCOPY TIME:  50 seconds.  4.0 mGy. PROCEDURE: The procedure, risks, benefits, and alternatives were explained to the patient. Questions regarding the procedure were encouraged and answered. The patient understands and consents to the procedure. A time-out was performed prior to initiating the procedure. The left flank region was prepped with chlorhexidine in a sterile fashion, and a sterile drape was applied covering the operative field. A sterile gown and sterile gloves were used for the procedure. Local anesthesia was provided with 1% Lidocaine. Ultrasound was used to localize the right kidney. Under ultrasound image was saved and recorded. Under direct ultrasound guidance, an 18 gauge trocar needle was advanced into the right renal collecting system. Ultrasound image documentation was performed. Aspiration of urine sample was performed followed by contrast injection. Percutaneous tract dilatation was then performed over the guidewire. A 10-French percutaneous nephrostomy tube  was then advanced and formed in the collecting system. Catheter position was confirmed by fluoroscopy after contrast injection. The catheter was secured at the skin with a Prolene retention suture and Stat-Lock device. The nephrostomy tube was attached to  a gravity bag. COMPLICATIONS: None. FINDINGS: Ultrasound demonstrates massive hydronephrosis of the right kidney with cortical thinning. Aspiration at the level of the collecting system demonstrates grossly purulent urine return. 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 The nephrostomy tube was formed at the level of the renal pelvis. There is return of purulent urine after tube placement. IMPRESSION: Right percutaneous nephrostomy tube placement. Grossly purulent urine return noted with a sample sent for culture analysis. A 10 French nephrostomy tube was placed and formed in the renal pelvis. This will be kept to gravity bag drainage. Electronically Signed   By: Irish Lack M.D.   On: 01/15/2024 15:51   DG Chest 2 View Result Date: 01/14/2024 CLINICAL DATA:  Upper abdominal pain. EXAM: CHEST - 2 VIEW COMPARISON:  Chest radiograph 05/21/2022. Included lung bases from abdominopelvic CT 01/05/2024 FINDINGS: The cardiomediastinal contours are normal. The lungs are clear. Pulmonary vasculature is normal. No consolidation, pleural effusion, or pneumothorax. No acute osseous abnormalities are seen. Left breast calcification. IMPRESSION: No active cardiopulmonary disease. Electronically Signed   By: Narda Rutherford M.D.   On: 01/14/2024 17:23    Microbiology: Results for orders placed or performed during the hospital encounter of 01/26/24  Urine Culture     Status: None   Collection Time: 01/26/24 12:10 PM   Specimen: Urine, Random  Result Value Ref Range Status   Specimen Description   Final    URINE, RANDOM Performed at [facility], [address], [place], [place] [address]    Special Requests   Final    NONE Reflexed from [id number] Performed at [facility], [address] [place], [place] [address]    Culture   Final    NO GROWTH Performed at [facility], [address], [place], [place] [address]    Report Status 01/27/2024 FINAL   Final  Aerobic/Anaerobic Culture w Gram Stain (surgical/deep wound)     Status: None   Collection Time: 01/28/24  1:44 PM   Specimen: Abscess  Result Value Ref Range Status   Specimen Description   Final    ABSCESS Performed at [facility], [address], [place], [place] [address]    Special Requests   Final    NONE Performed at [facility], [address] [place], [place] [address]    Gram Stain   Final    FEW WBC PRESENT, PREDOMINANTLY PMN NO ORGANISMS SEEN    Culture   Final    RARE PROTEUS MIRABILIS NO ANAEROBES ISOLATED Performed at [facility], [address] [place], [place] [address]    Report Status 02/02/2024 FINAL  Final   Organism ID, Bacteria PROTEUS MIRABILIS  Final      Susceptibility   Proteus mirabilis - MIC*    AMPICILLIN <=2 SENSITIVE Sensitive     CEFEPIME <=0.12 SENSITIVE Sensitive     CEFTAZIDIME <=1 SENSITIVE Sensitive     CEFTRIAXONE <=0.25 SENSITIVE Sensitive     CIPROFLOXACIN <=0.25 SENSITIVE Sensitive     GENTAMICIN <=1 SENSITIVE Sensitive     IMIPENEM 4 SENSITIVE Sensitive     TRIMETH/SULFA <=20 SENSITIVE Sensitive     AMPICILLIN/SULBACTAM <=2 SENSITIVE Sensitive     PIP/TAZO <=4 SENSITIVE Sensitive ug/mL    *  RARE PROTEUS MIRABILIS    Labs: CBC: Recent Labs  Lab 01/31/24 0551 02/01/24 0411 02/02/24 0403 02/03/24 0409 02/04/24 0345  WBC 8.2 15.4* 13.6* 11.7* 10.9*  HGB 8.1* 7.6* 8.1* 8.3* 7.5*  HCT 24.9* 22.8* 24.6* 24.9* 23.0*  MCV 83.0 83.5 82.0 83.8 84.6  PLT 668* 654* 696* 670* 656*   Basic Metabolic Panel: Recent Labs  Lab 01/31/24 0551 02/01/24 0411 02/02/24 0403 02/03/24 0409 02/04/24 0345  NA 137 131* 131* 133* 133*  K 4.6 4.8 4.6 4.1 5.0  CL 99 92* 93* 96* 97*  CO2 29 28 29 27 26   GLUCOSE 91 108* 100* 108* 118*  BUN 14 15 16 20  25*  CREATININE 0.87 0.89 0.93 0.83 0.91  CALCIUM 9.6 9.4 9.7 9.5 10.0   Liver Function Tests: No results for input(s): "AST", "ALT", "ALKPHOS",  "BILITOT", "PROT", "ALBUMIN" in the last 168 hours. CBG: No results for input(s): "GLUCAP" in the last 168 hours.  Discharge time spent: greater than 30 minutes.  Signed: Lurene Shadow, MD [facility] 02/06/2024

## 2024-02-06 NOTE — TOC Progression Note (Signed)
 Transition of Care [facility]) - Progression Note    Patient Details  Name: Belinda Oneill MRN: [id number] Date of Birth: 09/27/1961  [facility] of [facility]) CM/SW Contact  Chapman Fitch, RN Phone Number: 02/06/2024, 10:12 AM  Clinical Narrative:      Patient to be discharged today Discharge medications will be delivered prior to discharge to bedside       Expected Discharge Plan and Services         Expected Discharge Date: 02/05/24                                     Social Determinants of Health (SDOH) Interventions SDOH Screenings   Food Insecurity: No Food Insecurity (01/26/2024)  Housing: Low Risk  (01/26/2024)  Transportation Needs: No Transportation Needs (01/26/2024)  Utilities: Not At Risk (01/26/2024)  Social Connections: Unknown (01/14/2024)  Tobacco Use: High Risk (01/26/2024)    Readmission Risk Interventions     No data to display

## 2024-02-06 NOTE — Plan of Care (Signed)
  Problem: Education: Goal: Knowledge of General Education information will improve Description: Including pain rating scale, medication(s)/side effects and non-pharmacologic comfort measures Outcome: Adequate for Discharge   Problem: Health Behavior/Discharge Planning: Goal: Ability to manage health-related needs will improve Outcome: Adequate for Discharge   Problem: Clinical Measurements: Goal: Ability to maintain clinical measurements within normal limits will improve Outcome: Adequate for Discharge Goal: Will remain free from infection Outcome: Adequate for Discharge Goal: Diagnostic test results will improve Outcome: Adequate for Discharge Goal: Respiratory complications will improve Outcome: Adequate for Discharge Goal: Cardiovascular complication will be avoided Outcome: Adequate for Discharge   Problem: Activity: Goal: Risk for activity intolerance will decrease Outcome: Adequate for Discharge   Problem: Nutrition: Goal: Adequate nutrition will be maintained Outcome: Adequate for Discharge   Problem: Coping: Goal: Level of anxiety will decrease Outcome: Adequate for Discharge   Problem: Elimination: Goal: Will not experience complications related to bowel motility Outcome: Adequate for Discharge Goal: Will not experience complications related to urinary retention Outcome: Adequate for Discharge   Problem: Pain Managment: Goal: General experience of comfort will improve and/or be controlled Outcome: Adequate for Discharge   Problem: Safety: Goal: Ability to remain free from injury will improve Outcome: Adequate for Discharge   Problem: Skin Integrity: Goal: Risk for impaired skin integrity will decrease Outcome: Adequate for Discharge   Problem: Urinary Elimination: Goal: Signs and symptoms of infection will decrease Outcome: Adequate for Discharge

## 2024-02-07 ENCOUNTER — Emergency Department: Payer: Self-pay

## 2024-02-07 ENCOUNTER — Other Ambulatory Visit: Payer: Self-pay

## 2024-02-07 ENCOUNTER — Inpatient Hospital Stay
Admission: EM | Admit: 2024-02-07 | Discharge: 2024-02-12 | DRG: 690 | Disposition: A | Discharge status: Home / Self Care | Payer: Self-pay | Attending: Internal Medicine | Admitting: Internal Medicine

## 2024-02-07 DIAGNOSIS — Z803 Family history of malignant neoplasm of breast: Secondary | ICD-10-CM

## 2024-02-07 DIAGNOSIS — A498 Other bacterial infections of unspecified site: Secondary | ICD-10-CM | POA: Diagnosis not present

## 2024-02-07 DIAGNOSIS — N12 Tubulo-interstitial nephritis, not specified as acute or chronic: Principal | ICD-10-CM | POA: Diagnosis present

## 2024-02-07 DIAGNOSIS — N139 Obstructive and reflux uropathy, unspecified: Secondary | ICD-10-CM

## 2024-02-07 DIAGNOSIS — N151 Renal and perinephric abscess: Secondary | ICD-10-CM

## 2024-02-07 DIAGNOSIS — Z79899 Other long term (current) drug therapy: Secondary | ICD-10-CM

## 2024-02-07 DIAGNOSIS — A419 Sepsis, unspecified organism: Secondary | ICD-10-CM

## 2024-02-07 DIAGNOSIS — F17210 Nicotine dependence, cigarettes, uncomplicated: Secondary | ICD-10-CM | POA: Diagnosis present

## 2024-02-07 DIAGNOSIS — N200 Calculus of kidney: Secondary | ICD-10-CM

## 2024-02-07 DIAGNOSIS — N1330 Unspecified hydronephrosis: Secondary | ICD-10-CM | POA: Diagnosis present

## 2024-02-07 DIAGNOSIS — R911 Solitary pulmonary nodule: Secondary | ICD-10-CM | POA: Diagnosis present

## 2024-02-07 DIAGNOSIS — R52 Pain, unspecified: Principal | ICD-10-CM

## 2024-02-07 DIAGNOSIS — Z905 Acquired absence of kidney: Secondary | ICD-10-CM

## 2024-02-07 DIAGNOSIS — Z960 Presence of urogenital implants: Secondary | ICD-10-CM

## 2024-02-07 DIAGNOSIS — N390 Urinary tract infection, site not specified: Secondary | ICD-10-CM | POA: Diagnosis present

## 2024-02-07 DIAGNOSIS — N136 Pyonephrosis: Principal | ICD-10-CM | POA: Diagnosis present

## 2024-02-07 DIAGNOSIS — D75839 Thrombocytosis, unspecified: Secondary | ICD-10-CM | POA: Diagnosis present

## 2024-02-07 DIAGNOSIS — D649 Anemia, unspecified: Secondary | ICD-10-CM | POA: Diagnosis present

## 2024-02-07 DIAGNOSIS — I10 Essential (primary) hypertension: Secondary | ICD-10-CM | POA: Diagnosis present

## 2024-02-07 DIAGNOSIS — Z720 Tobacco use: Secondary | ICD-10-CM | POA: Diagnosis present

## 2024-02-07 DIAGNOSIS — J9811 Atelectasis: Secondary | ICD-10-CM | POA: Diagnosis present

## 2024-02-07 DIAGNOSIS — K8020 Calculus of gallbladder without cholecystitis without obstruction: Secondary | ICD-10-CM | POA: Diagnosis present

## 2024-02-07 DIAGNOSIS — K219 Gastro-esophageal reflux disease without esophagitis: Secondary | ICD-10-CM | POA: Diagnosis present

## 2024-02-07 DIAGNOSIS — Z87442 Personal history of urinary calculi: Secondary | ICD-10-CM

## 2024-02-07 DIAGNOSIS — G4700 Insomnia, unspecified: Secondary | ICD-10-CM | POA: Diagnosis present

## 2024-02-07 DIAGNOSIS — E871 Hypo-osmolality and hyponatremia: Secondary | ICD-10-CM | POA: Diagnosis present

## 2024-02-07 LAB — CBC WITH DIFFERENTIAL/PLATELET
Abs Immature Granulocytes: 0.62 10*3/uL — ABNORMAL HIGH (ref 0.00–0.07)
Basophils Absolute: 0 10*3/uL (ref 0.0–0.1)
Basophils Relative: 0 %
Eosinophils Absolute: 0 10*3/uL (ref 0.0–0.5)
Eosinophils Relative: 0 %
HCT: 27.4 % — ABNORMAL LOW (ref 36.0–46.0)
Hemoglobin: 8.8 g/dL — ABNORMAL LOW (ref 12.0–15.0)
Immature Granulocytes: 4 %
Lymphocytes Relative: 13 %
Lymphs Abs: 1.9 10*3/uL (ref 0.7–4.0)
MCH: 27.1 pg (ref 26.0–34.0)
MCHC: 32.1 g/dL (ref 30.0–36.0)
MCV: 84.3 fL (ref 80.0–100.0)
Monocytes Absolute: 1.1 10*3/uL — ABNORMAL HIGH (ref 0.1–1.0)
Monocytes Relative: 8 %
Neutro Abs: 10.6 10*3/uL — ABNORMAL HIGH (ref 1.7–7.7)
Neutrophils Relative %: 75 %
Platelets: 660 10*3/uL — ABNORMAL HIGH (ref 150–400)
RBC: 3.25 MIL/uL — ABNORMAL LOW (ref 3.87–5.11)
RDW: 14.9 % (ref 11.5–15.5)
WBC: 14.2 10*3/uL — ABNORMAL HIGH (ref 4.0–10.5)
nRBC: 0 % (ref 0.0–0.2)

## 2024-02-07 LAB — COMPREHENSIVE METABOLIC PANEL WITH GFR
ALT: 94 U/L — ABNORMAL HIGH (ref 0–44)
AST: 36 U/L (ref 15–41)
Albumin: 2.8 g/dL — ABNORMAL LOW (ref 3.5–5.0)
Alkaline Phosphatase: 122 U/L (ref 38–126)
Anion gap: 13 (ref 5–15)
BUN: 18 mg/dL (ref 8–23)
CO2: 26 mmol/L (ref 22–32)
Calcium: 10.1 mg/dL (ref 8.9–10.3)
Chloride: 94 mmol/L — ABNORMAL LOW (ref 98–111)
Creatinine, Ser: 1.06 mg/dL — ABNORMAL HIGH (ref 0.44–1.00)
GFR, Estimated: 59 mL/min — ABNORMAL LOW (ref >60)
Glucose, Bld: 121 mg/dL — ABNORMAL HIGH (ref 70–99)
Potassium: 4.3 mmol/L (ref 3.5–5.1)
Sodium: 133 mmol/L — ABNORMAL LOW (ref 135–145)
Total Bilirubin: 0.5 mg/dL (ref 0.0–1.2)
Total Protein: 9 g/dL — ABNORMAL HIGH (ref 6.5–8.1)

## 2024-02-07 LAB — URINALYSIS, W/ REFLEX TO CULTURE (INFECTION SUSPECTED)
Bilirubin Urine: NEGATIVE
Glucose, UA: NEGATIVE mg/dL
Ketones, ur: NEGATIVE mg/dL
Nitrite: NEGATIVE
Protein, ur: 30 mg/dL — AB
Specific Gravity, Urine: 1.027 (ref 1.005–1.030)
WBC, UA: >50 WBC/hpf (ref 0–5)
pH: 7 (ref 5.0–8.0)

## 2024-02-07 LAB — LACTIC ACID, PLASMA: Lactic Acid, Venous: 1.1 mmol/L (ref 0.5–1.9)

## 2024-02-07 LAB — TROPONIN I (HIGH SENSITIVITY): Troponin I (High Sensitivity): 4 ng/L (ref <18)

## 2024-02-07 LAB — CORTISOL: Cortisol, Plasma: 14.9 ug/dL

## 2024-02-07 LAB — LIPASE, BLOOD: Lipase: 19 U/L (ref 11–51)

## 2024-02-07 LAB — PROTIME-INR
INR: 1.3 — ABNORMAL HIGH (ref 0.8–1.2)
Prothrombin Time: 16 s — ABNORMAL HIGH (ref 11.4–15.2)

## 2024-02-07 MED ORDER — MORPHINE SULFATE (PF) 4 MG/ML IV SOLN
4.0000 mg | Freq: Once | INTRAVENOUS | Status: AC
  Administered 2024-02-07: 4 mg via INTRAVENOUS
  Filled 2024-02-07: qty 1

## 2024-02-07 MED ORDER — KETOROLAC TROMETHAMINE 30 MG/ML IJ SOLN
15.0000 mg | Freq: Once | INTRAMUSCULAR | Status: AC
Start: 2024-02-07 — End: 2024-02-07
  Administered 2024-02-07: 15 mg via INTRAVENOUS
  Filled 2024-02-07: qty 1

## 2024-02-07 MED ORDER — ENOXAPARIN SODIUM 40 MG/0.4ML IJ SOSY
40.0000 mg | PREFILLED_SYRINGE | INTRAMUSCULAR | Status: DC
  Administered 2024-02-07 – 2024-02-11 (×4): 40 mg via SUBCUTANEOUS
  Filled 2024-02-07 (×6): qty 0.4

## 2024-02-07 MED ORDER — ONDANSETRON HCL 4 MG/2ML IJ SOLN: 4.0000 mg | Freq: Four times a day (QID) | INTRAMUSCULAR | Status: DC | PRN

## 2024-02-07 MED ORDER — TRAZODONE HCL 50 MG PO TABS
50.0000 mg | ORAL_TABLET | Freq: Every evening | ORAL | Status: DC | PRN
  Administered 2024-02-07 – 2024-02-12 (×5): 50 mg via ORAL
  Filled 2024-02-07 (×5): qty 1

## 2024-02-07 MED ORDER — HYDROMORPHONE HCL 1 MG/ML IJ SOLN: 1.0000 mg | INTRAMUSCULAR | Status: DC | PRN

## 2024-02-07 MED ORDER — IOHEXOL 300 MG/ML  SOLN
100.0000 mL | Freq: Once | INTRAMUSCULAR | Status: AC | PRN
  Administered 2024-02-07: 100 mL via INTRAVENOUS

## 2024-02-07 MED ORDER — LACTATED RINGERS IV BOLUS (SEPSIS)
1000.0000 mL | Freq: Once | INTRAVENOUS | Status: AC
  Administered 2024-02-07: 1000 mL via INTRAVENOUS

## 2024-02-07 MED ORDER — HYDROMORPHONE HCL 1 MG/ML IJ SOLN
1.0000 mg | INTRAMUSCULAR | Status: DC | PRN
  Administered 2024-02-07 (×3): 1 mg via INTRAVENOUS
  Filled 2024-02-07 (×3): qty 1

## 2024-02-07 MED ORDER — OXYCODONE HCL 5 MG PO TABS
5.0000 mg | ORAL_TABLET | Freq: Four times a day (QID) | ORAL | Status: DC | PRN
  Administered 2024-02-07 – 2024-02-10 (×6): 5 mg via ORAL
  Filled 2024-02-07 (×7): qty 1

## 2024-02-07 MED ORDER — LEVOFLOXACIN IN D5W 750 MG/150ML IV SOLN
750.0000 mg | INTRAVENOUS | Status: DC
  Administered 2024-02-07: 750 mg via INTRAVENOUS
  Filled 2024-02-07: qty 150

## 2024-02-07 MED ORDER — ACETAMINOPHEN 325 MG PO TABS
650.0000 mg | ORAL_TABLET | Freq: Four times a day (QID) | ORAL | Status: DC | PRN
  Administered 2024-02-08 – 2024-02-10 (×2): 650 mg via ORAL
  Filled 2024-02-07 (×2): qty 2

## 2024-02-07 MED ORDER — PANTOPRAZOLE SODIUM 40 MG PO TBEC
40.0000 mg | DELAYED_RELEASE_TABLET | Freq: Every day | ORAL | Status: DC
  Administered 2024-02-07 – 2024-02-12 (×5): 40 mg via ORAL
  Filled 2024-02-07 (×5): qty 1

## 2024-02-07 MED ORDER — HYDROMORPHONE HCL 1 MG/ML IJ SOLN
1.0000 mg | INTRAMUSCULAR | Status: DC | PRN
  Administered 2024-02-07 – 2024-02-10 (×14): 1 mg via INTRAVENOUS
  Filled 2024-02-07 (×15): qty 1

## 2024-02-07 MED ORDER — ACETAMINOPHEN 325 MG PO TABS
650.0000 mg | ORAL_TABLET | Freq: Four times a day (QID) | ORAL | Status: DC | PRN
  Administered 2024-02-07: 650 mg via ORAL
  Filled 2024-02-07: qty 2

## 2024-02-07 MED ORDER — SODIUM CHLORIDE 0.9 % IV SOLN
1.5000 mg/kg | Freq: Once | INTRAVENOUS | Status: AC
  Administered 2024-02-07: 90 mg via INTRAVENOUS
  Filled 2024-02-07: qty 4.5

## 2024-02-07 MED ORDER — OXYBUTYNIN CHLORIDE 5 MG PO TABS
5.0000 mg | ORAL_TABLET | Freq: Three times a day (TID) | ORAL | Status: DC | PRN
  Administered 2024-02-11 – 2024-02-12 (×3): 5 mg via ORAL
  Filled 2024-02-07 (×4): qty 1

## 2024-02-07 MED ORDER — LACTATED RINGERS IV SOLN
150.0000 mL/h | INTRAVENOUS | Status: AC
  Administered 2024-02-07 (×2): 150 mL/h via INTRAVENOUS

## 2024-02-07 MED ORDER — ONDANSETRON HCL 4 MG PO TABS
4.0000 mg | ORAL_TABLET | Freq: Four times a day (QID) | ORAL | Status: DC | PRN
  Administered 2024-02-08 – 2024-02-11 (×2): 4 mg via ORAL
  Filled 2024-02-07 (×2): qty 1

## 2024-02-07 MED ORDER — ONDANSETRON HCL 4 MG/2ML IJ SOLN
4.0000 mg | INTRAMUSCULAR | Status: AC
  Administered 2024-02-07: 4 mg via INTRAVENOUS
  Filled 2024-02-07: qty 2

## 2024-02-07 MED ORDER — SODIUM CHLORIDE 0.9 % IV SOLN: INTRAVENOUS | Status: DC

## 2024-02-07 MED ORDER — IPRATROPIUM-ALBUTEROL 0.5-2.5 (3) MG/3ML IN SOLN
3.0000 mL | RESPIRATORY_TRACT | Status: DC | PRN
  Filled 2024-02-07: qty 3

## 2024-02-07 MED ORDER — TAMSULOSIN HCL 0.4 MG PO CAPS
0.4000 mg | ORAL_CAPSULE | Freq: Every day | ORAL | Status: DC
Start: 2024-02-07 — End: 2024-02-12
  Administered 2024-02-07 – 2024-02-11 (×5): 0.4 mg via ORAL
  Filled 2024-02-07 (×5): qty 1

## 2024-02-07 NOTE — Assessment & Plan Note (Signed)
 Noted recent complicated UTI in setting of Persistent right large hydronephrosis, suspected urinomas or abscesses, nonobstructing left renal calculi, s/p cystoscopy, right ureteral pyelogram and right ureteral stent placement on 02/03/2024 March 2025 urine culture growing pansensitive Proteus Was discharged on extended course of oral Levaquin Will transition to IV equivalent Repeat urine culture in the setting of sepsis evaluation Monitor Reconsult infectious disease as appropriate

## 2024-02-07 NOTE — Consult Note (Signed)
 I have been asked to see the patient by Dr. Alfonso Angles, for evaluation and management of right XGP kidney.  History of present illness: 63 year old female who initially presented in March for right XGP kidney had right nephrostomy tube placed on April 8 the patient had her right nephrostomy tube removed and had a right ureteral stent placed yesterday the patient started having significant right back pain and trouble breathing.  She presented the ER where CT demonstrated stent in proper placement concerns for right lower lobe pneumonia as well as decreased size of fluid collections in the perinephric space compared to previous CTs.  The patient's labs are within normal limits other than a slight increase in white count.  The patient states she is not have any fevers or chills she is having significant shortness of breath as well as right sided back pain.  She denies gross hematuria she denies dysuria she denies any bladder spasms.   Review of systems: A 12 point comprehensive review of systems was obtained and is negative unless otherwise stated in the history of present illness.  Patient Active Problem List   Diagnosis Date Noted   Sepsis (HCC) 02/07/2024   Obstructive uropathy 02/04/2024   Complicated UTI (urinary tract infection) 02/04/2024   Pyelonephritis 01/26/2024   Thrombocytosis 01/26/2024   Hydronephrosis of right kidney 01/26/2024   Back pain 01/26/2024   Reactive thrombocytosis 01/15/2024   Acute unilateral obstructive uropathy 01/14/2024   GERD without esophagitis 01/14/2024   Elevated blood pressure reading 01/14/2024   Tobacco abuse 01/14/2024   Acute lower UTI 01/14/2024    No current facility-administered medications on file prior to encounter.   Current Outpatient Medications on File Prior to Encounter  Medication Sig Dispense Refill   acetaminophen (TYLENOL) 500 MG tablet Take 2 tablets (1,000 mg total) by mouth every 6 (six) hours as needed. 100 tablet 2    famotidine (PEPCID) 20 MG tablet Take 1 tablet (20 mg total) by mouth 2 (two) times daily. 120 tablet 0   iron polysaccharides (NIFEREX) 150 MG capsule Take 1 capsule (150 mg total) by mouth daily. 30 capsule 0   levofloxacin (LEVAQUIN) 500 MG tablet Take 1 tablet (500 mg total) by mouth daily. 19 tablet 0   oxybutynin (DITROPAN) 5 MG tablet Take 1 tablet (5 mg total) by mouth every 8 (eight) hours as needed for bladder spasms. 30 tablet 0   oxyCODONE-acetaminophen (PERCOCET/ROXICET) 5-325 MG tablet Take 1 tablet by mouth every 8 (eight) hours as needed for moderate pain (pain score 4-6). 10 tablet 0   pantoprazole (PROTONIX) 40 MG tablet Take 1 tablet (40 mg total) by mouth daily. 30 tablet 1   senna-docusate (SENOKOT-S) 8.6-50 MG tablet Take 2 tablets by mouth 2 (two) times daily as needed for mild constipation. 100 tablet 0   tamsulosin (FLOMAX) 0.4 MG CAPS capsule Take 1 capsule (0.4 mg total) by mouth daily after supper.     traZODone (DESYREL) 50 MG tablet Take 1 tablet (50 mg total) by mouth at bedtime as needed for up to 7 days for sleep. 7 tablet 0    Past Medical History:  Diagnosis Date   Essential hypertension 01/14/2024   Kidney stones    Kidney stones     Past Surgical History:  Procedure Laterality Date   CYSTOSCOPY WITH STENT PLACEMENT Right 02/03/2024   Procedure: CYSTOSCOPY, WITH STENT INSERTION;  Surgeon: [name], MD;  Location: [facility] ORS;  Service: Urology;  Laterality: Right;   IR NEPHROSTOMY EXCHANGE RIGHT  01/28/2024   IR NEPHROSTOMY PLACEMENT RIGHT  01/15/2024   LITHOTRIPSY      Social History   Tobacco Use   Smoking status: Every Day    Current packs/day: 0.25    Average packs/day: 0.3 packs/day for 40.0 years (10.0 ttl pk-yrs)    Types: Cigarettes   Smokeless tobacco: Never  Substance Use Topics   Alcohol use: Yes    Comment: occ   Drug use: No    Family History  Problem Relation Age of Onset   Breast cancer Maternal Aunt     PE: Vitals:    02/07/24 0949 02/07/24 1147 02/07/24 1635 02/07/24 1743  BP: 111/77 134/80 102/71 112/72  Pulse: 83 90 91 95  Resp: 16  16   Temp: 98.1 F (36.7 C)  98.5 F (36.9 C)   TempSrc:      SpO2: 100%  98% 98%  Weight:      Height:       Patient appears to be mild distress. Respiratory: Increased work of breathing on nasal cannula Cardiovascular: Regular rate and rhythm per monitor Abdomen: Soft nontender nondistended GU: Significant right sided flank pain  Recent Labs    02/07/24 0255  WBC 14.2*  HGB 8.8*  HCT 27.4*   Recent Labs    02/07/24 0410  NA 133*  K 4.3  CL 94*  CO2 26  GLUCOSE 121*  BUN 18  CREATININE 1.06*  CALCIUM 10.1   Recent Labs    02/07/24 0256  INR 1.3*   No results for input(s): "LABURIN" in the last 72 hours. Results for orders placed or performed during the hospital encounter of 02/07/24  Blood Culture (routine x 2)     Status: None (Preliminary result)   Collection Time: 02/07/24  4:29 AM   Specimen: BLOOD  Result Value Ref Range Status   Specimen Description BLOOD BLOOD LEFT ARM  Final   Special Requests   Final    BOTTLES DRAWN AEROBIC AND ANAEROBIC Blood Culture results may not be optimal due to an inadequate volume of blood received in culture bottles   Culture   Final    NO GROWTH < 12 HOURS Performed at [facility], [address] [place], [place] [address]    Report Status PENDING  Incomplete  Blood Culture (routine x 2)     Status: None (Preliminary result)   Collection Time: 02/07/24  4:29 AM   Specimen: BLOOD  Result Value Ref Range Status   Specimen Description BLOOD BLOOD LEFT ARM  Final   Special Requests   Final    BOTTLES DRAWN AEROBIC AND ANAEROBIC Blood Culture adequate volume   Culture   Final    NO GROWTH < 12 HOURS Performed at [facility], [address] [place], [place] [address]    Report Status PENDING  Incomplete    Imaging: CT/12 IMPRESSION: 1. Marked right-sided hydronephrosis is  similar to prior. The extrarenal fluid collections seen along the upper pole right kidney persist but are slightly smaller with dominant collection measuring 3.7 x 2.2 cm today compared to 4.9 x 2.6 cm previously. There is a small gas bubble associated with this dominant collection which may be related to the percutaneous nephrostomy tube that was visible on the previous study although superinfection of this collection could also have this appearance. 2. Double-J right internal ureteral stent device in place with 3 mm stone in the low right renal pelvis and a 10 x 6 x 16 mm stone  in the proximal right ureter adjacent to the stent. 3. Multiple right-sided renal stones again noted including a dominant 14 mm interpolar stone and another dominant 10 mm stone in the lower pole region. 4. Nonobstructing stones left kidney. 5. Cholelithiasis. 6. Enlargement of the pulmonary outflow tract/main pulmonary arteries suggests pulmonary arterial hypertension. 7. Interval progression of collapse/consolidative disease in both lower lungs, right greater than left. Findings may reflect simple atelectasis although pneumonia is not excluded. 8. 2 mm left upper lobe pulmonary nodule. No follow-up needed if patient is low-risk.This recommendation follows the consensus statement: Guidelines for Management of Incidental Pulmonary Nodules  Imp: 63 year old female with history of right XGP kidney status post nephrostomy tube in March now status post nephrostomy tube removal and right ureteral stent placement on 02/03/2024.  She now presents with shortness of breath and flank pain.  # Shortness of breath Likely due to pneumonia unclear if this was aspirational or if the nephrostomy tube had accidentally punctured some of the lower lobe pleura. No indication for urologic intervention Recommend repeating CT chest on Monday to see if there is worsening fluid collection in the lower lobe then patient may need chest  tube Continue antibiotics  # XGP kidney Stent is in correct place there is no hydronephrosis fluid collections in the perinephric space are getting no concern for any active infection No intervention from urology needed. Continue antibiotics  Urology will continue to see    Thank you for involving me in this patient's care, I will continue to follow along.Please page with any further questions or concerns. Thelbert Finner

## 2024-02-07 NOTE — Assessment & Plan Note (Addendum)
 History of obstructive uropathy Persistent right-sided flank and upper chest pain with noted recent issues including Persistent right large hydronephrosis, suspected urinomas or abscesses, nonobstructing left renal calculi, s/p cystoscopy, right ureteral pyelogram and right ureteral stent placement on 02/03/2024 Patient noted to have pulled out right nephrostomy tube (placed on 01/15/2024 prior to admission) on 02/02/2024.  Nephrostomy tube was not replaced.  Imaging today with right-sided hydronephrosis is similar to prior. The extrarenal fluid collection seen along the upper pole right kidney persist but slightly smaller, gas bubbles in nephrostomy tube concerning for superinfection, stable double J ureteral stent with multiple stones present.  Will transition from oral levaquin to IV in setting of recent proteus urine culture 12/2023 per ID recommendations  Dr. Cathi Cluster w/ urology consulted given highly complicated urological presentation  Pain control  Antiemetics  Follow up urology recommendations

## 2024-02-07 NOTE — Progress Notes (Signed)
 Pt having sharp shooting pains in right rib cage around to her back.  She is only able to take short breaths.  Right side lungs sounds are crackles and diminished and left side is clear throughout.  Oxygen at 2 liters n/c applied.  Pt states pain is intermittent and lasts minutes.  She states the oxygen has helped.  Discussed with Elisabeth Guild NP who is covering and pain medications adjusted.      02/07/24 1932 02/07/24 1933 02/07/24 1956  Vitals  Temp 100.3 F (37.9 C)  --   --   Temp Source Oral  --   --   BP (!) 142/70  --   --   MAP (mmHg) 92  --   --   BP Location Right Arm  --   --   BP Method Automatic  --   --   Patient Position (if appropriate) Lying  --   --   Pulse Rate 97  --   --   Resp (!) 28  --  20  MEWS COLOR  MEWS Score Color Yellow  --  Green  Oxygen Therapy  SpO2 94 %  --   --   O2 Device Room Air Nasal Cannula  --   O2 Flow Rate (L/min)  --  2 L/min  --   Pain Assessment  Pain Scale 0-10  --   --   Pain Score 10  --   --   Pain Type Acute pain  --   --   Pain Location Rib cage  --   --   Pain Orientation Right;Anterior;Posterior  --   --   Pain Radiating Towards back  --   --   Pain Descriptors / Indicators Sharp;Shooting  --   --   Pain Frequency Intermittent  --   --   Pain Onset On-going (with deep breaths)  --   --   POSS Scale (Pasero Opioid Sedation Scale)  POSS *See Group Information* 1-Acceptable,Awake and alert  --   --    Sheela Denmark BSN RN [facility] 02/07/2024, 8:46 PM

## 2024-02-07 NOTE — ED Provider Notes (Signed)
 [facility] Provider Note    Event Date/Time   First MD Initiated Contact with Patient 02/07/24 [phone]     (approximate)   History   Chest Pain and Back Pain   HPI Belinda Oneill is a 63 y.o. female who has had a difficult recent medical course.  As per her recent hospital discharge summary from yesterday:  "recent percutaneous nephrostomy placement on 01/15/2024, being set discharged from the hospital on 01/20/2024 after hospitalization for pyelonephritis secondary to Proteus mirabilis and acute unilateral obstructive uropathy due to kidney stone.  She was discharged on Keflex to complete 2-week course of antibiotics. She presented to the hospital again on 01/26/2024 because of back pain and fever."  She was readmitted to the hospital for pyelonephritis on 01/26/2024, and according to the discharge summary, she had a right ureteral stent placed and completed 6 days of IV ceftriaxone and was discharged yesterday on Levaquin 750 mg daily for 3 weeks based on infectious disease recommendations.  She presents tonight for worsening middle and right sided pain all throughout her abdomen and up into her chest which she says is new.  The pain is taking her breath away but she does not feel acutely short of breath.  She said the pain is worse than it was before.  She denies fever.  She states that she was having pain before she was discharged from the hospital but this seems a little bit worse.     Physical Exam   Triage Vital Signs: ED Triage Vitals  Encounter Vitals Group     BP 02/07/24 0246 115/66     Systolic BP Percentile --      Diastolic BP Percentile --      Pulse Rate 02/07/24 0246 (!) 105     Resp 02/07/24 0246 (!) 39     Temp 02/07/24 0246 98.6 F (37 C)     Temp Source 02/07/24 0246 Oral     SpO2 02/07/24 0246 99 %     Weight 02/07/24 0251 59.8 kg (131 lb 13.4 oz)     Height 02/07/24 0251 1.626 m (5\' 4" )     Head Circumference --      Peak Flow --       Pain Score 02/07/24 0249 10     Pain Loc --      Pain Education --      Exclude from Growth Chart --     Most recent vital signs: Vitals:   02/07/24 0412 02/07/24 0600  BP:  (!) 120/94  Pulse:  98  Resp:  (!) 24  Temp:    SpO2: 100% 97%    General: Awake, alert, appears to be in quite a bit of discomfort. CV:  Good peripheral perfusion.  Borderline tachycardia, regular rhythm, normal heart sounds. Resp:  Patient is splinting due to the pain, taking rapid and very shallow breaths.  However her lungs are clear and I encouraged her to take a longer deep slow breaths. Abd:  No distention.  Tenderness to palpation all throughout the abdomen, worse on the right, but not localizable.     ED Results / Procedures / Treatments   Labs (all labs ordered are listed, but only abnormal results are displayed) Labs Reviewed  CBC WITH DIFFERENTIAL/PLATELET - Abnormal; Notable for the following components:      Result Value   WBC 14.2 (*)    RBC 3.25 (*)    Hemoglobin 8.8 (*)    HCT 27.4 (*)  Platelets 660 (*)    Neutro Abs 10.6 (*)    Monocytes Absolute 1.1 (*)    Abs Immature Granulocytes 0.62 (*)    All other components within normal limits  COMPREHENSIVE METABOLIC PANEL WITH GFR - Abnormal; Notable for the following components:   Sodium 133 (*)    Chloride 94 (*)    Glucose, Bld 121 (*)    Creatinine, Ser 1.06 (*)    Total Protein 9.0 (*)    Albumin 2.8 (*)    ALT 94 (*)    GFR, Estimated 59 (*)    All other components within normal limits  URINALYSIS, W/ REFLEX TO CULTURE (INFECTION SUSPECTED) - Abnormal; Notable for the following components:   Color, Urine YELLOW (*)    APPearance CLOUDY (*)    Hgb urine dipstick SMALL (*)    Protein, ur 30 (*)    Leukocytes,Ua LARGE (*)    Bacteria, UA RARE (*)    All other components within normal limits  PROTIME-INR - Abnormal; Notable for the following components:   Prothrombin Time 16.0 (*)    INR 1.3 (*)    All other components  within normal limits  CULTURE, BLOOD (ROUTINE X 2)  CULTURE, BLOOD (ROUTINE X 2)  LIPASE, BLOOD  LACTIC ACID, PLASMA  TROPONIN I (HIGH SENSITIVITY)     EKG  ED ECG REPORT I, Lynnda Sas, the attending physician, personally viewed and interpreted this ECG.  Date: 02/07/2024 EKG Time: 2:46 AM Rate: 103 Rhythm: Mild sinus tachycardia QRS Axis: normal Intervals: normal ST/T Wave abnormalities: Non-specific ST segment / T-wave changes, but no clear evidence of acute ischemia. Narrative Interpretation: no definitive evidence of acute ischemia; does not meet STEMI criteria.    RADIOLOGY I viewed and interpreted the patient's two-view chest x-ray and I see no obvious lobar pneumonia.  Radiologist mention worsening atelectasis, which I believe is likely due to her splinting as documented above.  See ED course for details regarding CT scan   PROCEDURES:  Critical Care performed: No  .1-3 Lead EKG Interpretation  Performed by: Lynnda Sas, MD Authorized by: Lynnda Sas, MD     Interpretation: normal     ECG rate:  98   ECG rate assessment: normal     Rhythm: sinus rhythm     Ectopy: none     Conduction: normal       IMPRESSION / MDM / ASSESSMENT AND PLAN / ED COURSE  I reviewed the triage vital signs and the nursing notes.                              Differential diagnosis includes, but is not limited to, persistent renal colic, worsening UTI/pyelonephritis, development of perinephric or periureteral abscess or fluid collection, other iatrogenic complication due to recent instrumentation such as perforation, healthcare associated pneumonia, PE, ACS.  Patient's presentation is most consistent with acute presentation with potential threat to life or bodily function.  Labs/studies ordered: CMP, CBC with differential, lipase, pro time-INR, high-sensitivity troponin, urinalysis, lactic acid, blood cultures x 2, two-view chest x-ray, CT  chest/abdomen/pelvis  Interventions/Medications given:  Medications  lidocaine (XYLOCAINE) 90 mg in sodium chloride 0.9 % 100 mL IVPB (has no administration in time range)  lactated ringers bolus 1,000 mL (0 mLs Intravenous Stopped 02/07/24 0510)  morphine (PF) 4 MG/ML injection 4 mg (4 mg Intravenous Given 02/07/24 0414)  ondansetron (ZOFRAN) injection 4 mg (4 mg Intravenous Given 02/07/24 0414)  ketorolac (TORADOL) 30 MG/ML injection 15 mg (15 mg Intravenous Given 02/07/24 0414)  iohexol (OMNIPAQUE) 300 MG/ML solution 100 mL (100 mLs Intravenous Contrast Given 02/07/24 0523)  morphine (PF) 4 MG/ML injection 4 mg (4 mg Intravenous Given 02/07/24 0612)    (Note:  hospital course my include additional interventions and/or labs/studies not listed above.)   Most likely the patient is having persistent pain due to all the recent instrumentation and ongoing infection for which she is still receiving outpatient management.  However, the pain is making it difficult for her to breeze and I will initiate a "possible sepsis" workup.  Morphine 4 mg IV, Zofran 4 mg IV, and Toradol 15 mg IV as documented above.  Will continue to treat the patient's symptoms while further evaluating the possibility of acute infection or surgical complication.  Labs are generally stable although her leukocytosis has increased to few points to 14.2.  Creatinine has worsened slightly to 1.06 with changes minor.  Otherwise labs are generally appropriate.  No ischemia on EKG, normal high-sensitivity troponin.  Will obtain CT chest/abdomen/pelvis for further assessment.  The patient is on the cardiac monitor to evaluate for evidence of arrhythmia and/or significant heart rate changes.   Clinical Course as of 02/07/24 0649  Sat Feb 07, 2024  0604 Patient still having substantial pain.  I ordered another dose of morphine 4 mg IV and ordered lidocaine IV renal colic protocol which I think will likely be very beneficial for her given the  nature of her discomfort and recent instrumentation. [CF]  [id number] I viewed and interpreted the patient's CT of the chest/abdomen/pelvis.  Persistent right-sided hydronephrosis.  Radiology commented on development of atelectasis, persistent abscesses below the hemidiaphragm, persistent and essentially unchanged hydronephrosis, stent placement, etc.  The radiologist also mention some evidence of pulmonary arterial hypertension.  I called and consulted by phone with Dr. Garon Kaiser the radiologist to get his opinion about whether the symptoms could be the result of a PE.  He specifically waited and was able to see no evidence of large central pulmonary emboli, and he feels relatively confident that the patient is not having any peripheral PEs either.  He did not feel that it was likely enough that she would benefit from a CTA chest.  The patient is still uncomfortable but feeling a little bit better.  However, given no clinical improvement and in fact clinical worsening based on the irritation of her hemidiaphragm making it difficult for her to breathe, splinting, developing atelectasis, and intractable pain, I will consult the hospitalist service for admission and additional management.  She may also benefit from additional urology consultation given no interval change in the hydronephrosis. [CF]  [id number] I consulted with Dr. Vallarie Gauze with the hospitalist service who will put in admission orders and her daytime colleague will see the patient. [CF]    Clinical Course User Index [CF] Lynnda Sas, MD     FINAL CLINICAL IMPRESSION(S) / ED DIAGNOSES   Final diagnoses:  None     Rx / DC Orders   ED Discharge Orders     None        Note:  This document was prepared using Dragon voice recognition software and may include unintentional dictation errors.   Lynnda Sas, MD 02/07/24 [phone]

## 2024-02-07 NOTE — Assessment & Plan Note (Addendum)
  Meeting sepsis criteria with heart rate 100s, systolic pressures into the 80s White count 14 on presentation Noted complicated recent urological history including Persistent right large hydronephrosis, suspected urinomas or abscesses, nonobstructing left renal calculi, s/p cystoscopy, right ureteral pyelogram and right ureteral stent placement on 02/03/2024 and pansensitive Proteus urine culture ?  Ureteral stent superinfection on CT imaging Also with?  Pneumonia Lactate within normal limits Clinically dry Will continue IV Levaquin for respiratory as well as urinary coverage noted prior ID recommendations Panculture LR IV fluid resuscitation Dr. Cathi Cluster with urology consulted for further recommendations in setting of complicated urological history

## 2024-02-07 NOTE — ED Triage Notes (Addendum)
 Pt arrives via EMS from home for complaints of 10/10 chest pains radiating to back. Pt discharged yesterday after stent placed in kidney. A/ox4, breathing shallow and tachypneic.

## 2024-02-07 NOTE — Assessment & Plan Note (Signed)
Patient states she is no longer smoking

## 2024-02-07 NOTE — H&P (Signed)
 History and Physical    Patient: Belinda Oneill ZOX:[id number] DOB: Feb 18, 1961 DOA: 02/07/2024 DOS: the patient was seen and examined on 02/07/2024 PCP: Pcp, No  Patient coming from: Home  Chief Complaint:  Chief Complaint  Patient presents with   Chest Pain   Back Pain   HPI: Belinda Oneill is a 63 y.o. female with medical history significant of GERD, renal stones, recent percutaneous nephrostomy placement on 01/15/2024, being set discharged from the hospital on 01/20/2024 after hospitalization for pyelonephritis secondary to Proteus mirabilis and acute unilateral obstructive uropathy due to kidney stone with readmission for similar issues 3/31-4/11 presenting w/ sepsis, pyelonphritis, ? PNA.  Patient noted to have been discharged yesterday for issues including acute right pyelonephritis.  Had completed course of IV Rocephin.  Also a formal ID evaluation.  Discharged on Levaquin for 3 weeks given recent Proteus urine culture from March 2025 admission.  Has cystoscopy as well as right ureteral pyelogram and right ureteral stent placed on April 8.  Patient noted to have pulled out her prior right nephrostomy tube on April 7 which was not replaced.  Patient was discharged on Flomax as well as oxybutynin.  Patient reports severe worsening right-sided flank pain as well as right-sided rib pain since discharge.  No fevers or chills.  No nausea or vomiting.  Mild abdominal pain.  No reported diarrhea.  No longer smoking.  No reported illicit drug use. Presented to the ER afebrile, heart rate 100s, respirations into the upper 20s, BP stable satting 100% on room air.  White count 14.2, hemoglobin 8.8, platelets 660, creatinine 1.06, glucose 121.  Lactate within normal limits.  Urinalysis indicative of infection. Review of Systems: As mentioned in the history of present illness. All other systems reviewed and are negative. Past Medical History:  Diagnosis Date   Essential hypertension 01/14/2024   Kidney  stones    Kidney stones    Past Surgical History:  Procedure Laterality Date   CYSTOSCOPY WITH STENT PLACEMENT Right 02/03/2024   Procedure: CYSTOSCOPY, WITH STENT INSERTION;  Surgeon: [name], MD;  Location: [facility] ORS;  Service: Urology;  Laterality: Right;   IR NEPHROSTOMY EXCHANGE RIGHT  01/28/2024   IR NEPHROSTOMY PLACEMENT RIGHT  01/15/2024   LITHOTRIPSY     Social History:  reports that she has been smoking cigarettes. She has a 10 pack-year smoking history. She has never used smokeless tobacco. She reports current alcohol use. She reports that she does not use drugs.  No Known Allergies  Family History  Problem Relation Age of Onset   Breast cancer Maternal Aunt     Prior to Admission medications   Medication Sig Start Date End Date Taking? Authorizing Provider  acetaminophen (TYLENOL) 500 MG tablet Take 2 tablets (1,000 mg total) by mouth every 6 (six) hours as needed. 01/05/24 01/04/25 Yes Evans, Alexandra, PA-C  famotidine (PEPCID) 20 MG tablet Take 1 tablet (20 mg total) by mouth 2 (two) times daily. 04/02/20 02/07/24 Yes Clevester Dally., MD  iron polysaccharides (NIFEREX) 150 MG capsule Take 1 capsule (150 mg total) by mouth daily. 01/21/24  Yes Donaciano Frizzle, MD  levofloxacin (LEVAQUIN) 500 MG tablet Take 1 tablet (500 mg total) by mouth daily. 02/06/24  Yes Sheril Dines, MD  oxybutynin (DITROPAN) 5 MG tablet Take 1 tablet (5 mg total) by mouth every 8 (eight) hours as needed for bladder spasms. 02/06/24  Yes Sheril Dines, MD  oxyCODONE-acetaminophen (PERCOCET/ROXICET) 5-325 MG tablet Take 1 tablet by mouth every 8 (eight)  hours as needed for moderate pain (pain score 4-6). 02/06/24  Yes Sheril Dines, MD  pantoprazole (PROTONIX) 40 MG tablet Take 1 tablet (40 mg total) by mouth daily. 08/09/19 02/07/24 Yes Ruth Cove, MD  senna-docusate (SENOKOT-S) 8.6-50 MG tablet Take 2 tablets by mouth 2 (two) times daily as needed for mild constipation. 01/20/24  Yes Zhang, Dekui, MD   tamsulosin (FLOMAX) 0.4 MG CAPS capsule Take 1 capsule (0.4 mg total) by mouth daily after supper. 02/06/24  Yes Sheril Dines, MD  traZODone (DESYREL) 50 MG tablet Take 1 tablet (50 mg total) by mouth at bedtime as needed for up to 7 days for sleep. 02/06/24 02/13/24 Yes Sheril Dines, MD    Physical Exam: Vitals:   02/07/24 0600 02/07/24 0600 02/07/24 0740 02/07/24 0949  BP: (!) 120/94 (!) 120/94  111/77  Pulse: 79 98  83  Resp: (!) 23 (!) 24  16  Temp:   98.3 F (36.8 C) 98.1 F (36.7 C)  TempSrc:   Oral   SpO2: 100% 97%  100%  Weight:      Height:       .Physical Exam Constitutional:      Appearance: She is normal weight.  HENT:     Head: Normocephalic and atraumatic.     Nose: Nose normal.     Mouth/Throat:     Mouth: Mucous membranes are dry.  Eyes:     Pupils: Pupils are equal, round, and reactive to light.  Cardiovascular:     Rate and Rhythm: Normal rate and regular rhythm.  Pulmonary:     Effort: Pulmonary effort is normal.     Comments: Decreased breath sounds in RLL   Abdominal:     Comments: Marked R sided flank pain TTP  + bowel sounds    Musculoskeletal:        General: Normal range of motion.  Skin:    General: Skin is dry.  Neurological:     General: No focal deficit present.  Psychiatric:        Mood and Affect: Mood normal.     Data Reviewed:  There are no new results to review at this time.  CT CHEST ABDOMEN PELVIS W CONTRAST CLINICAL DATA:  Worsening pain throughout the chest and abdomen. Multiple recent urological procedures.  EXAM: CT CHEST, ABDOMEN, AND PELVIS WITH CONTRAST  TECHNIQUE: Multidetector CT imaging of the chest, abdomen and pelvis was performed following the standard protocol during bolus administration of intravenous contrast.  RADIATION DOSE REDUCTION: This exam was performed according to the departmental dose-optimization program which includes automated exposure control, adjustment of the mA and/or kV  according to patient size and/or use of iterative reconstruction technique.  CONTRAST:  100mL OMNIPAQUE IOHEXOL 300 MG/ML  SOLN  COMPARISON:  Abdomen and pelvis CT 01/26/2024.  FINDINGS: CT CHEST FINDINGS  Cardiovascular: The heart size is upper normal to borderline enlarged. No substantial pericardial effusion. No thoracic aortic aneurysm. No substantial atherosclerosis of the thoracic aorta. Enlargement of the pulmonary outflow tract/main pulmonary arteries suggests pulmonary arterial hypertension. No large central pulmonary embolus.  Mediastinum/Nodes: No mediastinal lymphadenopathy. There is no hilar lymphadenopathy. The esophagus has normal imaging features. There is no axillary lymphadenopathy.  Lungs/Pleura: 2 mm left upper lobe nodule identified on image 46/5. Subtle changes of centrilobular emphysema noted. Collapse/consolidation noted in the right lung base, present previously but progressive in the interval. Progressive collapse/consolidation noted left lower lobe as well. Right middle lobe collapse/consolidation evident. No pleural effusion.  Musculoskeletal: No  worrisome lytic or sclerotic osseous abnormality.  CT ABDOMEN PELVIS FINDINGS  Hepatobiliary: No suspicious focal abnormality within the liver parenchyma. Layering tiny calcified gallstones evident. No intrahepatic or extrahepatic biliary dilation.  Pancreas: No focal mass lesion. No dilatation of the main duct. No intraparenchymal cyst. No peripancreatic edema.  Spleen: No splenomegaly. No suspicious focal mass lesion.  Adrenals/Urinary Tract: No adrenal nodule or mass. Marked right-sided hydronephrosis is similar to prior. The extrarenal fluid collection seen along the upper pole right kidney persist but are slightly smaller with dominant collection measuring 3.7 x 2.2 cm today compared to 4.9 x 2.6 cm previously. There is a small gas bubble associated with this dominant collection (45/3) which may  be related to the percutaneous nephrostomy tube that was visible on the previous study although superinfection of this collection could also have this appearance. Multiple right-sided renal stones again noted including a dominant 14 mm interpolar stone and another dominant 10 mm stone in the lower pole region. A double-J right internal ureteral stent device is noted with 3 mm stone in the low right renal pelvis (65/3) and a 10 x 6 x 16 mm stone in the proximal right ureter adjacent to the stent on 72/3 and coronal 40/6. Nonobstructing stones again noted left kidney. No left ureteral stones. No bladder stones.  Stomach/Bowel: Stomach is unremarkable. No gastric wall thickening. No evidence of outlet obstruction. Duodenum is normally positioned as is the ligament of Treitz. No small bowel wall thickening. No small bowel dilatation. The terminal ileum is normal. The appendix is not well visualized, but there is no edema or inflammation in the region of the cecal tip to suggest appendicitis. No gross colonic mass. No colonic wall thickening.  Vascular/Lymphatic: There is moderate atherosclerotic calcification of the abdominal aorta without aneurysm. Upper normal retroperitoneal lymph nodes are evident in the right para-aortic and retrocaval space. No pelvic sidewall lymphadenopathy.  Reproductive: There is no adnexal mass.  Other: No intraperitoneal free fluid.  Musculoskeletal: No worrisome lytic or sclerotic osseous abnormality.  IMPRESSION: 1. Marked right-sided hydronephrosis is similar to prior. The extrarenal fluid collections seen along the upper pole right kidney persist but are slightly smaller with dominant collection measuring 3.7 x 2.2 cm today compared to 4.9 x 2.6 cm previously. There is a small gas bubble associated with this dominant collection which may be related to the percutaneous nephrostomy tube that was visible on the previous study although superinfection of  this collection could also have this appearance. 2. Double-J right internal ureteral stent device in place with 3 mm stone in the low right renal pelvis and a 10 x 6 x 16 mm stone in the proximal right ureter adjacent to the stent. 3. Multiple right-sided renal stones again noted including a dominant 14 mm interpolar stone and another dominant 10 mm stone in the lower pole region. 4. Nonobstructing stones left kidney. 5. Cholelithiasis. 6. Enlargement of the pulmonary outflow tract/main pulmonary arteries suggests pulmonary arterial hypertension. 7. Interval progression of collapse/consolidative disease in both lower lungs, right greater than left. Findings may reflect simple atelectasis although pneumonia is not excluded. 8. 2 mm left upper lobe pulmonary nodule. No follow-up needed if patient is low-risk.This recommendation follows the consensus statement: Guidelines for Management of Incidental Pulmonary Nodules Detected on CT Images: From the Fleischner Society 2017; Radiology 2017; 284:228-243.  Electronically Signed   By: Donnal Fusi M.D.   On: 02/07/2024 06:08 DG Chest 2 View CLINICAL DATA:  Chest pain  EXAM: CHEST -  2 VIEW  COMPARISON:  01/14/2024 and abdominal CT 01/26/2024  FINDINGS: Bands of opacity at the lung bases. No edema, effusion, or pneumothorax. Normal heart size. Artifact from EKG leads.  IMPRESSION: Bands of atelectasis at the lung bases, increased from abdominal CT 01/26/2024, cannot exclude superimposed infection.  Electronically Signed   By: Ronnette Coke M.D.   On: 02/07/2024 04:37  Lab Results  Component Value Date   WBC 14.2 (H) 02/07/2024   HGB 8.8 (L) 02/07/2024   HCT 27.4 (L) 02/07/2024   MCV 84.3 02/07/2024   PLT 660 (H) 02/07/2024   Last metabolic panel Lab Results  Component Value Date   GLUCOSE 121 (H) 02/07/2024   NA 133 (L) 02/07/2024   K 4.3 02/07/2024   CL 94 (L) 02/07/2024   CO2 26 02/07/2024   BUN 18 02/07/2024    CREATININE 1.06 (H) 02/07/2024   GFRNONAA 59 (L) 02/07/2024   CALCIUM 10.1 02/07/2024   PHOS 3.7 01/19/2024   PROT 9.0 (H) 02/07/2024   ALBUMIN 2.8 (L) 02/07/2024   BILITOT 0.5 02/07/2024   ALKPHOS 122 02/07/2024   AST 36 02/07/2024   ALT 94 (H) 02/07/2024   ANIONGAP 13 02/07/2024    Assessment and Plan: * Pyelonephritis History of obstructive uropathy Persistent right-sided flank and upper chest pain with noted recent issues including Persistent right large hydronephrosis, suspected urinomas or abscesses, nonobstructing left renal calculi, s/p cystoscopy, right ureteral pyelogram and right ureteral stent placement on 02/03/2024 Patient noted to have pulled out right nephrostomy tube (placed on 01/15/2024 prior to admission) on 02/02/2024.  Nephrostomy tube was not replaced.  Imaging today with right-sided hydronephrosis is similar to prior. The extrarenal fluid collection seen along the upper pole right kidney persist but slightly smaller, gas bubbles in nephrostomy tube concerning for superinfection, stable double J ureteral stent with multiple stones present.  Will transition from oral levaquin to IV in setting of recent proteus urine culture 12/2023 per ID recommendations  Dr. Cathi Cluster w/ urology consulted given highly complicated urological presentation  Pain control  Antiemetics  Follow up urology recommendations   Sepsis [facility])  Meeting sepsis criteria with heart rate 100s, systolic pressures into the 80s White count 14 on presentation Noted complicated recent urological history including Persistent right large hydronephrosis, suspected urinomas or abscesses, nonobstructing left renal calculi, s/p cystoscopy, right ureteral pyelogram and right ureteral stent placement on 02/03/2024 and pansensitive Proteus urine culture ?  Ureteral stent superinfection on CT imaging Also with?  Pneumonia Lactate within normal limits Clinically dry Will continue IV Levaquin for respiratory as well  as urinary coverage noted prior ID recommendations Panculture LR IV fluid resuscitation Dr. Cathi Cluster with urology consulted for further recommendations in setting of complicated urological history   Complicated UTI (urinary tract infection) Noted recent complicated UTI in setting of Persistent right large hydronephrosis, suspected urinomas or abscesses, nonobstructing left renal calculi, s/p cystoscopy, right ureteral pyelogram and right ureteral stent placement on 02/03/2024 March 2025 urine culture growing pansensitive Proteus Was discharged on extended course of oral Levaquin Will transition to IV equivalent Repeat urine culture in the setting of sepsis evaluation Monitor Reconsult infectious disease as appropriate  Tobacco abuse Patient states she is no longer smoking  GERD without esophagitis PPI      Advance Care Planning:   Code Status: Full Code   Consults: Urology   Family Communication: No family at the bedside   Severity of Illness: The appropriate patient status for this patient is OBSERVATION. Observation status is  judged to be reasonable and necessary in order to provide the required intensity of service to ensure the patient's safety. The patient's presenting symptoms, physical exam findings, and initial radiographic and laboratory data in the context of their medical condition is felt to place them at decreased risk for further clinical deterioration. Furthermore, it is anticipated that the patient will be medically stable for discharge from the hospital within 2 midnights of admission.   Author: Corrinne Din, MD 02/07/2024 9:50 AM  For on call review [email or web address].

## 2024-02-07 NOTE — Assessment & Plan Note (Signed)
 PPI ?

## 2024-02-08 DIAGNOSIS — N12 Tubulo-interstitial nephritis, not specified as acute or chronic: Secondary | ICD-10-CM | POA: Diagnosis not present

## 2024-02-08 LAB — COMPREHENSIVE METABOLIC PANEL WITH GFR
ALT: 64 U/L — ABNORMAL HIGH (ref 0–44)
AST: 34 U/L (ref 15–41)
Albumin: 2.2 g/dL — ABNORMAL LOW (ref 3.5–5.0)
Alkaline Phosphatase: 98 U/L (ref 38–126)
Anion gap: 10 (ref 5–15)
BUN: 12 mg/dL (ref 8–23)
CO2: 25 mmol/L (ref 22–32)
Calcium: 9.1 mg/dL (ref 8.9–10.3)
Chloride: 94 mmol/L — ABNORMAL LOW (ref 98–111)
Creatinine, Ser: 0.95 mg/dL (ref 0.44–1.00)
GFR, Estimated: >60 mL/min (ref >60)
Glucose, Bld: 106 mg/dL — ABNORMAL HIGH (ref 70–99)
Potassium: 4.4 mmol/L (ref 3.5–5.1)
Sodium: 129 mmol/L — ABNORMAL LOW (ref 135–145)
Total Bilirubin: 0.6 mg/dL (ref 0.0–1.2)
Total Protein: 6.9 g/dL (ref 6.5–8.1)

## 2024-02-08 LAB — CBC
HCT: 21.1 % — ABNORMAL LOW (ref 36.0–46.0)
Hemoglobin: 7 g/dL — ABNORMAL LOW (ref 12.0–15.0)
MCH: 26.9 pg (ref 26.0–34.0)
MCHC: 33.2 g/dL (ref 30.0–36.0)
MCV: 81.2 fL (ref 80.0–100.0)
Platelets: 633 10*3/uL — ABNORMAL HIGH (ref 150–400)
RBC: 2.6 MIL/uL — ABNORMAL LOW (ref 3.87–5.11)
RDW: 14.8 % (ref 11.5–15.5)
WBC: 17.2 10*3/uL — ABNORMAL HIGH (ref 4.0–10.5)
nRBC: 0 % (ref 0.0–0.2)

## 2024-02-08 LAB — ABO/RH: ABO/RH(D): O NEG

## 2024-02-08 LAB — PREPARE RBC (CROSSMATCH)

## 2024-02-08 MED ORDER — LEVOFLOXACIN IN D5W 750 MG/150ML IV SOLN
750.0000 mg | INTRAVENOUS | Status: DC
  Administered 2024-02-08 – 2024-02-10 (×3): 750 mg via INTRAVENOUS
  Filled 2024-02-08 (×3): qty 150

## 2024-02-08 MED ORDER — SODIUM CHLORIDE 0.9% IV SOLUTION: Freq: Once | INTRAVENOUS | Status: AC

## 2024-02-08 NOTE — Progress Notes (Signed)
 Progress Note    Belinda Oneill  NFA:[id number] DOB: 06-Feb-1961  DOA: 02/07/2024 PCP: Pcp, No      Brief Narrative:    Medical records reviewed and are as summarized below:  Belinda Oneill is a 63 y.o. female with medical history significant for GERD, renal stones, recent percutaneous nephrostomy placement on 01/15/2024 and discharged from the hospital on 01/20/2024 after hospitalization for pyelonephritis secondary to Proteus mirabilis and acute unilateral obstructive uropathy due to kidney stone.  She was discharged on 2-week course of Keflex at that time. She presented to the hospital again on 01/26/2024 because of back pain and fever.  She was readmitted to the hospital for right-sided pyelonephritis and hydronephrosis.  She was discharged home 02/06/2024 on a 3-week course of Levaquin.  However, she developed significant right-sided back and flank pain when she got home.  She came back to the hospital on 02/07/2024 because of uncontrolled pain.  Chest x-ray impression Bands of atelectasis at the lung bases, increased from abdominal CT 01/26/2024, cannot exclude superimposed infection.   CT chest abdomen and pelvis impression 1. Marked right-sided hydronephrosis is similar to prior. The extrarenal fluid collections seen along the upper pole right kidney persist but are slightly smaller with dominant collection measuring 3.7 x 2.2 cm today compared to 4.9 x 2.6 cm previously. There is a small gas bubble associated with this dominant collection which may be related to the percutaneous nephrostomy tube that was visible on the previous study although superinfection of this collection could also have this appearance. 2. Double-J right internal ureteral stent device in place with 3 mm stone in the low right renal pelvis and a 10 x 6 x 16 mm stone in the proximal right ureter adjacent to the stent. 3. Multiple right-sided renal stones again noted including a dominant 14 mm  interpolar stone and another dominant 10 mm stone in the lower pole region. 4. Nonobstructing stones left kidney. 5. Cholelithiasis. 6. Enlargement of the pulmonary outflow tract/main pulmonary arteries suggests pulmonary arterial hypertension. 7. Interval progression of collapse/consolidative disease in both lower lungs, right greater than left. Findings may reflect simple atelectasis although pneumonia is not excluded. 8. 2 mm left upper lobe pulmonary nodule. No follow-up needed if patient is low-risk.This recommendation follows the consensus statement: Guidelines for Management of Incidental Pulmonary Nodules Detected on CT Images: From the Fleischner Society 2017; Radiology 2017; 284:228-243.       Assessment/Plan:   Principal Problem:   Pyelonephritis Active Problems:   Complicated UTI (urinary tract infection)   Sepsis (HCC)   Tobacco abuse   GERD without esophagitis    Body mass index is 22.63 kg/m.    Acute right pyelonephritis: Continue IV Levaquin.  No sepsis.  Recent discharge from the hospital on 02/06/2024 after completing 6 days of IV ceftriaxone for 2 days of cefadroxil and 2 days of Levaquin. Recent urine culture from 01/15/2024 showed Proteus mirabilis which was pansensitive.    Persistent right large hydronephrosis, extrarenal fluid collections along the upper pole right kidney, double-J right ureteral stent in place with proximal right ureteral stone adjacent to stent, known bilateral renal calculi: He was evaluated by Dr. Cathi Cluster, the urologist.  Plan for repeat CT chest with contrast tomorrow.  Plan discussed with Dr. Cathi Cluster via secure chat. Continue Flomax and oxybutynin as needed for stent discomfort S/p right ureteral stent placed on 02/03/2024 (prior to this admission) Previously had right nephrostomy tube was placed on 01/15/2024 but this accidentally came  out on 02/01/2024 while ambulating.     Unable to exclude pneumonia: Continue IV  Levaquin.   Acute on chronic anemia: Hemoglobin down to 7 from 8.8.  Transfuse 1 unit of PRBCs because of shortness of breath.  Discussed risks and benefits of blood transfusion and she is agreeable to transfusion.  Leukocytosis: Persistent.  Likely from underlying infection Thrombocytosis: This is likely reactive from underlying infection   Shortness of breath: Likely multifactorial.  May be related to pain and worsening anemia.  Unable to exclude pneumonia. Breathing is better today.   Hyponatremia: Serum level is trending down.  This appears to be chronic.  She she has had fluctuating sodium level.      Comorbidities include GERD, tobacco use disorder, chronic anemia, insomnia              Diet Order             Diet NPO time specified  Diet effective midnight           Diet full liquid Room service appropriate? Yes; Fluid consistency: Thin  Diet effective now                            Consultants: Urologist  Procedures: None    Medications:    sodium chloride   Intravenous Once   enoxaparin (LOVENOX) injection  40 mg Subcutaneous Q24H   pantoprazole  40 mg Oral Daily   tamsulosin  0.4 mg Oral QPC supper   Continuous Infusions:  levofloxacin (LEVAQUIN) IV       Anti-infectives (From admission, onward)    Start     Dose/Rate Route Frequency Ordered Stop   02/08/24 1000  levofloxacin (LEVAQUIN) IVPB 750 mg        750 mg 100 mL/hr over 90 Minutes Intravenous Every 24 hours 02/08/24 0815     02/07/24 1000  levofloxacin (LEVAQUIN) IVPB 750 mg  Status:  Discontinued        750 mg 100 mL/hr over 90 Minutes Intravenous Every 48 hours 02/07/24 0914 02/08/24 0815              Family Communication/Anticipated D/C date and plan/Code Status   DVT prophylaxis: enoxaparin (LOVENOX) injection 40 mg Start: 02/07/24 1000     Code Status: Full Code  Family Communication: None Disposition Plan: Plan to discharge home   Status is:  Inpatient Remains inpatient appropriate because: Hydronephrosis, pyelonephritis       Subjective:   Interval events noted.  She complains of right flank and right mid back pain.  Objective:    Vitals:   02/08/24 0139 02/08/24 0335 02/08/24 0506 02/08/24 0757  BP: 100/61 116/80 117/70 (!) 117/91  Pulse: 99 (!) 108 (!) 110 100  Resp: 20 20 20 17   Temp: 99.6 F (37.6 C) 99.9 F (37.7 C) 99.2 F (37.3 C) 98.4 F (36.9 C)  TempSrc: Oral  Oral Oral  SpO2: 99% 93% 98% 98%  Weight:      Height:       No data found.   Intake/Output Summary (Last 24 hours) at 02/08/2024 1135 Last data filed at 02/08/2024 0513 Gross per 24 hour  Intake 2858.31 ml  Output --  Net 2858.31 ml   Filed Weights   02/07/24 0251  Weight: 59.8 kg    Exam:  GEN: NAD SKIN: Warm and dry EYES: No pallor or icterus ENT: MMM CV: RRR PULM: CTA B ABD: soft, ND, NT, +  BS CNS: AAO x 3, non focal EXT: No edema or tenderness GU: Right CVA tenderness       Data Reviewed:   I have personally reviewed following labs and imaging studies:  Labs: Labs show the following:   Basic Metabolic Panel: Recent Labs  Lab 02/02/24 0403 02/03/24 0409 02/04/24 0345 02/07/24 0410 02/08/24 0402  NA 131* 133* 133* 133* 129*  K 4.6 4.1 5.0 4.3 4.4  CL 93* 96* 97* 94* 94*  CO2 29 27 26 26 25   GLUCOSE 100* 108* 118* 121* 106*  BUN 16 20 25* 18 12  CREATININE 0.93 0.83 0.91 1.06* 0.95  CALCIUM 9.7 9.5 10.0 10.1 9.1   GFR Estimated Creatinine Clearance: 52.3 mL/min (by C-G formula based on SCr of 0.95 mg/dL). Liver Function Tests: Recent Labs  Lab 02/07/24 0410 02/08/24 0402  AST 36 34  ALT 94* 64*  ALKPHOS 122 98  BILITOT 0.5 0.6  PROT 9.0* 6.9  ALBUMIN 2.8* 2.2*   Recent Labs  Lab 02/07/24 0255  LIPASE 19   No results for input(s): "AMMONIA" in the last 168 hours. Coagulation profile Recent Labs  Lab 02/07/24 0256  INR 1.3*    CBC: Recent Labs  Lab 02/02/24 0403 02/03/24 0409  02/04/24 0345 02/07/24 0255 02/08/24 0402  WBC 13.6* 11.7* 10.9* 14.2* 17.2*  NEUTROABS  --   --   --  10.6*  --   HGB 8.1* 8.3* 7.5* 8.8* 7.0*  HCT 24.6* 24.9* 23.0* 27.4* 21.1*  MCV 82.0 83.8 84.6 84.3 81.2  PLT 696* 670* 656* 660* 633*   Cardiac Enzymes: No results for input(s): "CKTOTAL", "CKMB", "CKMBINDEX", "TROPONINI" in the last 168 hours. BNP (last 3 results) No results for input(s): "PROBNP" in the last 8760 hours. CBG: No results for input(s): "GLUCAP" in the last 168 hours. D-Dimer: No results for input(s): "DDIMER" in the last 72 hours. Hgb A1c: No results for input(s): "HGBA1C" in the last 72 hours. Lipid Profile: No results for input(s): "CHOL", "HDL", "LDLCALC", "TRIG", "CHOLHDL", "LDLDIRECT" in the last 72 hours. Thyroid function studies: No results for input(s): "TSH", "T4TOTAL", "T3FREE", "THYROIDAB" in the last 72 hours.  Invalid input(s): "FREET3" Anemia work up: No results for input(s): "VITAMINB12", "FOLATE", "FERRITIN", "TIBC", "IRON", "RETICCTPCT" in the last 72 hours. Sepsis Labs: Recent Labs  Lab 02/03/24 0409 02/04/24 0345 02/07/24 0255 02/07/24 0429 02/08/24 0402  WBC 11.7* 10.9* 14.2*  --  17.2*  LATICACIDVEN  --   --   --  1.1  --     Microbiology Recent Results (from the past 240 hours)  Blood Culture (routine x 2)     Status: None (Preliminary result)   Collection Time: 02/07/24  4:29 AM   Specimen: BLOOD  Result Value Ref Range Status   Specimen Description BLOOD BLOOD LEFT ARM  Final   Special Requests   Final    BOTTLES DRAWN AEROBIC AND ANAEROBIC Blood Culture results may not be optimal due to an inadequate volume of blood received in culture bottles   Culture   Final    NO GROWTH 1 DAY Performed at [facility], [address] [place], [place] [address]    Report Status PENDING  Incomplete  Blood Culture (routine x 2)     Status: None (Preliminary result)   Collection Time: 02/07/24  4:29 AM   Specimen: BLOOD   Result Value Ref Range Status   Specimen Description BLOOD BLOOD LEFT ARM  Final   Special Requests   Final  BOTTLES DRAWN AEROBIC AND ANAEROBIC Blood Culture adequate volume   Culture   Final    NO GROWTH 1 DAY Performed at [facility], [address] [place], [place] [address]    Report Status PENDING  Incomplete    Procedures and diagnostic studies:  CT CHEST ABDOMEN PELVIS W CONTRAST Result Date: 02/07/2024 CLINICAL DATA:  Worsening pain throughout the chest and abdomen. Multiple recent urological procedures. EXAM: CT CHEST, ABDOMEN, AND PELVIS WITH CONTRAST TECHNIQUE: Multidetector CT imaging of the chest, abdomen and pelvis was performed following the standard protocol during bolus administration of intravenous contrast. RADIATION DOSE REDUCTION: This exam was performed according to the departmental dose-optimization program which includes automated exposure control, adjustment of the mA and/or kV according to patient size and/or use of iterative reconstruction technique. CONTRAST:  100mL OMNIPAQUE IOHEXOL 300 MG/ML  SOLN COMPARISON:  Abdomen and pelvis CT 01/26/2024. FINDINGS: CT CHEST FINDINGS Cardiovascular: The heart size is upper normal to borderline enlarged. No substantial pericardial effusion. No thoracic aortic aneurysm. No substantial atherosclerosis of the thoracic aorta. Enlargement of the pulmonary outflow tract/main pulmonary arteries suggests pulmonary arterial hypertension. No large central pulmonary embolus. Mediastinum/Nodes: No mediastinal lymphadenopathy. There is no hilar lymphadenopathy. The esophagus has normal imaging features. There is no axillary lymphadenopathy. Lungs/Pleura: 2 mm left upper lobe nodule identified on image 46/5. Subtle changes of centrilobular emphysema noted. Collapse/consolidation noted in the right lung base, present previously but progressive in the interval. Progressive collapse/consolidation noted left lower lobe as well. Right  middle lobe collapse/consolidation evident. No pleural effusion. Musculoskeletal: No worrisome lytic or sclerotic osseous abnormality. CT ABDOMEN PELVIS FINDINGS Hepatobiliary: No suspicious focal abnormality within the liver parenchyma. Layering tiny calcified gallstones evident. No intrahepatic or extrahepatic biliary dilation. Pancreas: No focal mass lesion. No dilatation of the main duct. No intraparenchymal cyst. No peripancreatic edema. Spleen: No splenomegaly. No suspicious focal mass lesion. Adrenals/Urinary Tract: No adrenal nodule or mass. Marked right-sided hydronephrosis is similar to prior. The extrarenal fluid collection seen along the upper pole right kidney persist but are slightly smaller with dominant collection measuring 3.7 x 2.2 cm today compared to 4.9 x 2.6 cm previously. There is a small gas bubble associated with this dominant collection (45/3) which may be related to the percutaneous nephrostomy tube that was visible on the previous study although superinfection of this collection could also have this appearance. Multiple right-sided renal stones again noted including a dominant 14 mm interpolar stone and another dominant 10 mm stone in the lower pole region. A double-J right internal ureteral stent device is noted with 3 mm stone in the low right renal pelvis (65/3) and a 10 x 6 x 16 mm stone in the proximal right ureter adjacent to the stent on 72/3 and coronal 40/6. Nonobstructing stones again noted left kidney. No left ureteral stones. No bladder stones. Stomach/Bowel: Stomach is unremarkable. No gastric wall thickening. No evidence of outlet obstruction. Duodenum is normally positioned as is the ligament of Treitz. No small bowel wall thickening. No small bowel dilatation. The terminal ileum is normal. The appendix is not well visualized, but there is no edema or inflammation in the region of the cecal tip to suggest appendicitis. No gross colonic mass. No colonic wall thickening.  Vascular/Lymphatic: There is moderate atherosclerotic calcification of the abdominal aorta without aneurysm. Upper normal retroperitoneal lymph nodes are evident in the right para-aortic and retrocaval space. No pelvic sidewall lymphadenopathy. Reproductive: There is no adnexal mass. Other: No intraperitoneal free fluid. Musculoskeletal: No  worrisome lytic or sclerotic osseous abnormality. IMPRESSION: 1. Marked right-sided hydronephrosis is similar to prior. The extrarenal fluid collections seen along the upper pole right kidney persist but are slightly smaller with dominant collection measuring 3.7 x 2.2 cm today compared to 4.9 x 2.6 cm previously. There is a small gas bubble associated with this dominant collection which may be related to the percutaneous nephrostomy tube that was visible on the previous study although superinfection of this collection could also have this appearance. 2. Double-J right internal ureteral stent device in place with 3 mm stone in the low right renal pelvis and a 10 x 6 x 16 mm stone in the proximal right ureter adjacent to the stent. 3. Multiple right-sided renal stones again noted including a dominant 14 mm interpolar stone and another dominant 10 mm stone in the lower pole region. 4. Nonobstructing stones left kidney. 5. Cholelithiasis. 6. Enlargement of the pulmonary outflow tract/main pulmonary arteries suggests pulmonary arterial hypertension. 7. Interval progression of collapse/consolidative disease in both lower lungs, right greater than left. Findings may reflect simple atelectasis although pneumonia is not excluded. 8. 2 mm left upper lobe pulmonary nodule. No follow-up needed if patient is low-risk.This recommendation follows the consensus statement: Guidelines for Management of Incidental Pulmonary Nodules Detected on CT Images: From the Fleischner Society 2017; Radiology 2017; 284:228-243. Electronically Signed   By: Donnal Fusi M.D.   On: 02/07/2024 06:08   DG Chest  2 View Result Date: 02/07/2024 CLINICAL DATA:  Chest pain EXAM: CHEST - 2 VIEW COMPARISON:  01/14/2024 and abdominal CT 01/26/2024 FINDINGS: Bands of opacity at the lung bases. No edema, effusion, or pneumothorax. Normal heart size. Artifact from EKG leads. IMPRESSION: Bands of atelectasis at the lung bases, increased from abdominal CT 01/26/2024, cannot exclude superimposed infection. Electronically Signed   By: Ronnette Coke M.D.   On: 02/07/2024 04:37               LOS: 0 days   Demiana Crumbley  [facility]   Pager on [email or web address]. If 7PM-7AM, please contact night-coverage at [email or web address]     02/08/2024, 11:35 AM

## 2024-02-08 NOTE — Progress Notes (Signed)
 Subjective: Doing better this morning, pain is improved, patient's not breathing is heavily.  She states pain is about a 8 but she states is sitting very comfortably.  Objective: Vital signs in last 24 hours: Temp:  [98.4 F (36.9 C)-100.3 F (37.9 C)] 98.4 F (36.9 C) (04/13 0757) Pulse Rate:  [90-110] 100 (04/13 0757) Resp:  [16-28] 17 (04/13 0757) BP: (100-142)/(61-91) 117/91 (04/13 0757) SpO2:  [93 %-99 %] 98 % (04/13 0757)  Intake/Output from previous day: 04/12 0701 - 04/13 0700 In: 2858.3 [P.O.:240; I.V.:2468.3; IV Piggyback:150] Out: -  Intake/Output this shift: No intake/output data recorded.  Physical Exam:  General: Alert and oriented CV: RRR Lungs: Normal work of breathing off oxygen this morning Abdomen: Soft nontender GU: Mild lower back pain, mild CVA tenderness Back: Upper left back at level of rib cage significantly tender area of pneumonia as  Lab Results: Recent Labs    02/07/24 0255 02/08/24 0402  HGB 8.8* 7.0*  HCT 27.4* 21.1*   BMET Recent Labs    02/07/24 0410 02/08/24 0402  NA 133* 129*  K 4.3 4.4  CL 94* 94*  CO2 26 25  GLUCOSE 121* 106*  BUN 18 12  CREATININE 1.06* 0.95  CALCIUM 10.1 9.1     Studies/Results: CT CHEST ABDOMEN PELVIS W CONTRAST Result Date: 02/07/2024 CLINICAL DATA:  Worsening pain throughout the chest and abdomen. Multiple recent urological procedures. EXAM: CT CHEST, ABDOMEN, AND PELVIS WITH CONTRAST TECHNIQUE: Multidetector CT imaging of the chest, abdomen and pelvis was performed following the standard protocol during bolus administration of intravenous contrast. RADIATION DOSE REDUCTION: This exam was performed according to the departmental dose-optimization program which includes automated exposure control, adjustment of the mA and/or kV according to patient size and/or use of iterative reconstruction technique. CONTRAST:  100mL OMNIPAQUE IOHEXOL 300 MG/ML  SOLN COMPARISON:  Abdomen and pelvis CT 01/26/2024.  FINDINGS: CT CHEST FINDINGS Cardiovascular: The heart size is upper normal to borderline enlarged. No substantial pericardial effusion. No thoracic aortic aneurysm. No substantial atherosclerosis of the thoracic aorta. Enlargement of the pulmonary outflow tract/main pulmonary arteries suggests pulmonary arterial hypertension. No large central pulmonary embolus. Mediastinum/Nodes: No mediastinal lymphadenopathy. There is no hilar lymphadenopathy. The esophagus has normal imaging features. There is no axillary lymphadenopathy. Lungs/Pleura: 2 mm left upper lobe nodule identified on image 46/5. Subtle changes of centrilobular emphysema noted. Collapse/consolidation noted in the right lung base, present previously but progressive in the interval. Progressive collapse/consolidation noted left lower lobe as well. Right middle lobe collapse/consolidation evident. No pleural effusion. Musculoskeletal: No worrisome lytic or sclerotic osseous abnormality. CT ABDOMEN PELVIS FINDINGS Hepatobiliary: No suspicious focal abnormality within the liver parenchyma. Layering tiny calcified gallstones evident. No intrahepatic or extrahepatic biliary dilation. Pancreas: No focal mass lesion. No dilatation of the main duct. No intraparenchymal cyst. No peripancreatic edema. Spleen: No splenomegaly. No suspicious focal mass lesion. Adrenals/Urinary Tract: No adrenal nodule or mass. Marked right-sided hydronephrosis is similar to prior. The extrarenal fluid collection seen along the upper pole right kidney persist but are slightly smaller with dominant collection measuring 3.7 x 2.2 cm today compared to 4.9 x 2.6 cm previously. There is a small gas bubble associated with this dominant collection (45/3) which may be related to the percutaneous nephrostomy tube that was visible on the previous study although superinfection of this collection could also have this appearance. Multiple right-sided renal stones again noted including a dominant 14  mm interpolar stone and another dominant 10 mm stone in the lower pole  region. A double-J right internal ureteral stent device is noted with 3 mm stone in the low right renal pelvis (65/3) and a 10 x 6 x 16 mm stone in the proximal right ureter adjacent to the stent on 72/3 and coronal 40/6. Nonobstructing stones again noted left kidney. No left ureteral stones. No bladder stones. Stomach/Bowel: Stomach is unremarkable. No gastric wall thickening. No evidence of outlet obstruction. Duodenum is normally positioned as is the ligament of Treitz. No small bowel wall thickening. No small bowel dilatation. The terminal ileum is normal. The appendix is not well visualized, but there is no edema or inflammation in the region of the cecal tip to suggest appendicitis. No gross colonic mass. No colonic wall thickening. Vascular/Lymphatic: There is moderate atherosclerotic calcification of the abdominal aorta without aneurysm. Upper normal retroperitoneal lymph nodes are evident in the right para-aortic and retrocaval space. No pelvic sidewall lymphadenopathy. Reproductive: There is no adnexal mass. Other: No intraperitoneal free fluid. Musculoskeletal: No worrisome lytic or sclerotic osseous abnormality. IMPRESSION: 1. Marked right-sided hydronephrosis is similar to prior. The extrarenal fluid collections seen along the upper pole right kidney persist but are slightly smaller with dominant collection measuring 3.7 x 2.2 cm today compared to 4.9 x 2.6 cm previously. There is a small gas bubble associated with this dominant collection which may be related to the percutaneous nephrostomy tube that was visible on the previous study although superinfection of this collection could also have this appearance. 2. Double-J right internal ureteral stent device in place with 3 mm stone in the low right renal pelvis and a 10 x 6 x 16 mm stone in the proximal right ureter adjacent to the stent. 3. Multiple right-sided renal stones again  noted including a dominant 14 mm interpolar stone and another dominant 10 mm stone in the lower pole region. 4. Nonobstructing stones left kidney. 5. Cholelithiasis. 6. Enlargement of the pulmonary outflow tract/main pulmonary arteries suggests pulmonary arterial hypertension. 7. Interval progression of collapse/consolidative disease in both lower lungs, right greater than left. Findings may reflect simple atelectasis although pneumonia is not excluded. 8. 2 mm left upper lobe pulmonary nodule. No follow-up needed if patient is low-risk.This recommendation follows the consensus statement: Guidelines for Management of Incidental Pulmonary Nodules Detected on CT Images: From the Fleischner Society 2017; Radiology 2017; 284:228-243. Electronically Signed   By: Donnal Fusi M.D.   On: 02/07/2024 06:08   DG Chest 2 View Result Date: 02/07/2024 CLINICAL DATA:  Chest pain EXAM: CHEST - 2 VIEW COMPARISON:  01/14/2024 and abdominal CT 01/26/2024 FINDINGS: Bands of opacity at the lung bases. No edema, effusion, or pneumothorax. Normal heart size. Artifact from EKG leads. IMPRESSION: Bands of atelectasis at the lung bases, increased from abdominal CT 01/26/2024, cannot exclude superimposed infection. Electronically Signed   By: Ronnette Coke M.D.   On: 02/07/2024 04:37    Assessment/Plan: 63 year old female with a left XGP kidney represents for pneumonia and UTI after nephrostomy tube removal and left ureteral stent placement.  # Shortness of breath Likely due to pneumonia unclear if this was aspirational or if the nephrostomy tube had accidentally punctured some of the lower lobe pleura. No indication for urologic intervention now We will make n.p.o. at midnight repeat chest CT in the morning if worsening infection consider chest tube    # XGP kidney Stent is in correct place there is no hydronephrosis fluid collections in the perinephric space are getting no concern for any active infection No  intervention from urology  needed. Continue antibiotics     LOS: 0 days   Aimee Houseman MD 02/08/2024, 10:24 AM [facility]

## 2024-02-09 ENCOUNTER — Encounter: Payer: Self-pay | Admitting: Internal Medicine

## 2024-02-09 ENCOUNTER — Inpatient Hospital Stay: Payer: Self-pay

## 2024-02-09 DIAGNOSIS — N12 Tubulo-interstitial nephritis, not specified as acute or chronic: Secondary | ICD-10-CM | POA: Diagnosis not present

## 2024-02-09 LAB — CBC WITH DIFFERENTIAL/PLATELET
Abs Immature Granulocytes: 0.26 10*3/uL — ABNORMAL HIGH (ref 0.00–0.07)
Basophils Absolute: 0 10*3/uL (ref 0.0–0.1)
Basophils Relative: 0 %
Eosinophils Absolute: 0.1 10*3/uL (ref 0.0–0.5)
Eosinophils Relative: 0 %
HCT: 25.7 % — ABNORMAL LOW (ref 36.0–46.0)
Hemoglobin: 8.5 g/dL — ABNORMAL LOW (ref 12.0–15.0)
Immature Granulocytes: 2 %
Lymphocytes Relative: 14 %
Lymphs Abs: 1.6 10*3/uL (ref 0.7–4.0)
MCH: 27.3 pg (ref 26.0–34.0)
MCHC: 33.1 g/dL (ref 30.0–36.0)
MCV: 82.6 fL (ref 80.0–100.0)
Monocytes Absolute: 1.1 10*3/uL — ABNORMAL HIGH (ref 0.1–1.0)
Monocytes Relative: 10 %
Neutro Abs: 8.2 10*3/uL — ABNORMAL HIGH (ref 1.7–7.7)
Neutrophils Relative %: 74 %
Platelets: 628 10*3/uL — ABNORMAL HIGH (ref 150–400)
RBC: 3.11 MIL/uL — ABNORMAL LOW (ref 3.87–5.11)
RDW: 14.9 % (ref 11.5–15.5)
WBC: 11.2 10*3/uL — ABNORMAL HIGH (ref 4.0–10.5)
nRBC: 0 % (ref 0.0–0.2)

## 2024-02-09 LAB — BPAM RBC
Blood Product Expiration Date: 202505062359
ISSUE DATE / TIME: 202504131556
Unit Type and Rh: 5100

## 2024-02-09 LAB — TYPE AND SCREEN
ABO/RH(D): O NEG
Antibody Screen: NEGATIVE
Unit division: 0

## 2024-02-09 LAB — BASIC METABOLIC PANEL WITH GFR
Anion gap: 11 (ref 5–15)
BUN: 14 mg/dL (ref 8–23)
CO2: 26 mmol/L (ref 22–32)
Calcium: 9.6 mg/dL (ref 8.9–10.3)
Chloride: 96 mmol/L — ABNORMAL LOW (ref 98–111)
Creatinine, Ser: 0.97 mg/dL (ref 0.44–1.00)
GFR, Estimated: >60 mL/min (ref >60)
Glucose, Bld: 107 mg/dL — ABNORMAL HIGH (ref 70–99)
Potassium: 4.3 mmol/L (ref 3.5–5.1)
Sodium: 133 mmol/L — ABNORMAL LOW (ref 135–145)

## 2024-02-09 MED ORDER — SENNOSIDES-DOCUSATE SODIUM 8.6-50 MG PO TABS: 1.0000 | ORAL_TABLET | Freq: Two times a day (BID) | ORAL | Status: DC | PRN

## 2024-02-09 MED ORDER — IOHEXOL 300 MG/ML  SOLN
75.0000 mL | Freq: Once | INTRAMUSCULAR | Status: AC | PRN
  Administered 2024-02-09: 75 mL via INTRAVENOUS

## 2024-02-09 MED ORDER — SODIUM CHLORIDE 0.9 % IV SOLN: INTRAVENOUS | Status: AC

## 2024-02-09 MED ORDER — POLYETHYLENE GLYCOL 3350 17 G PO PACK
17.0000 g | PACK | Freq: Every day | ORAL | Status: DC
  Administered 2024-02-09 – 2024-02-11 (×3): 17 g via ORAL
  Filled 2024-02-09 (×4): qty 1

## 2024-02-09 NOTE — Progress Notes (Signed)
 Progress Note    Belinda Oneill  WUJ:[id number] DOB: Apr 29, 1961  DOA: 02/07/2024 PCP: Pcp, No      Brief Narrative:    Medical records reviewed and are as summarized below:  Belinda Oneill is a 63 y.o. female with medical history significant for GERD, renal stones, recent percutaneous nephrostomy placement on 01/15/2024 and discharged from the hospital on 01/20/2024 after hospitalization for pyelonephritis secondary to Proteus mirabilis and acute unilateral obstructive uropathy due to kidney stone.  She was discharged on 2-week course of Keflex at that time. She presented to the hospital again on 01/26/2024 because of back pain and fever.  She was readmitted to the hospital for right-sided pyelonephritis and hydronephrosis.  She was discharged home 02/06/2024 on a 3-week course of Levaquin.  However, she developed significant right-sided back and flank pain when she got home.  She came back to the hospital on 02/07/2024 because of uncontrolled pain.  Chest x-ray impression Bands of atelectasis at the lung bases, increased from abdominal CT 01/26/2024, cannot exclude superimposed infection.   CT chest abdomen and pelvis impression 1. Marked right-sided hydronephrosis is similar to prior. The extrarenal fluid collections seen along the upper pole right kidney persist but are slightly smaller with dominant collection measuring 3.7 x 2.2 cm today compared to 4.9 x 2.6 cm previously. There is a small gas bubble associated with this dominant collection which may be related to the percutaneous nephrostomy tube that was visible on the previous study although superinfection of this collection could also have this appearance. 2. Double-J right internal ureteral stent device in place with 3 mm stone in the low right renal pelvis and a 10 x 6 x 16 mm stone in the proximal right ureter adjacent to the stent. 3. Multiple right-sided renal stones again noted including a dominant 14 mm  interpolar stone and another dominant 10 mm stone in the lower pole region. 4. Nonobstructing stones left kidney. 5. Cholelithiasis. 6. Enlargement of the pulmonary outflow tract/main pulmonary arteries suggests pulmonary arterial hypertension. 7. Interval progression of collapse/consolidative disease in both lower lungs, right greater than left. Findings may reflect simple atelectasis although pneumonia is not excluded. 8. 2 mm left upper lobe pulmonary nodule. No follow-up needed if patient is low-risk.This recommendation follows the consensus statement: Guidelines for Management of Incidental Pulmonary Nodules Detected on CT Images: From the Fleischner Society 2017; Radiology 2017; 284:228-243.       Assessment/Plan:   Principal Problem:   Pyelonephritis Active Problems:   Complicated UTI (urinary tract infection)   Sepsis (HCC)   Tobacco abuse   GERD without esophagitis    Body mass index is 22.63 kg/m.    Acute right pyelonephritis: Continue IV Levaquin.  Recent discharge from the hospital on 02/06/2024 after completing 6 days of IV ceftriaxone for 2 days of cefadroxil and 2 days of Levaquin. Recent urine culture from 01/15/2024 showed Proteus mirabilis which was pansensitive.    Persistent right large hydronephrosis, extrarenal fluid collections along the upper pole right kidney, double-J right ureteral stent in place with proximal right ureteral stone adjacent to stent, known bilateral renal calculi: He was evaluated by Dr. Jennette Bill, the urologist.   Repeat CT chest with contrast has been ordered. Patient had concerns about recurrent IV contrast exposure.  Risks, benefits and alternatives were explained and she has decided to proceed with CT chest with contrast. She will be hydrated with IV normal saline to minimize risk of nephrotoxicity.  Continue Flomax and oxybutynin  as needed for stent discomfort S/p right ureteral stent placed on 02/03/2024 (prior to this  admission) Previously had right nephrostomy tube was placed on 01/15/2024 but this accidentally came out on 02/01/2024 while ambulating.     Unable to exclude pneumonia: Continue IV Levaquin.   Acute on chronic anemia: Hemoglobin up from 7-8.5.  S/p transfusion of 1 unit of PRBCs on 02/08/2024.    Leukocytosis: Persistent but improving.  Likely from underlying infection Thrombocytosis: This is likely reactive from underlying infection   Shortness of breath: Improved.  She said blood transfusion made her feel better.   Hyponatremia: Flat but stable sodium level.  This appears to be chronic.       Comorbidities include GERD, tobacco use disorder, chronic anemia, insomnia              Diet Order             Diet NPO time specified  Diet effective midnight                            Consultants: Urologist  Procedures: None    Medications:    enoxaparin (LOVENOX) injection  40 mg Subcutaneous Q24H   pantoprazole  40 mg Oral Daily   polyethylene glycol  17 g Oral Daily   tamsulosin  0.4 mg Oral QPC supper   Continuous Infusions:  sodium chloride     levofloxacin (LEVAQUIN) IV 750 mg (02/08/24 1150)     Anti-infectives (From admission, onward)    Start     Dose/Rate Route Frequency Ordered Stop   02/08/24 1000  levofloxacin (LEVAQUIN) IVPB 750 mg        750 mg 100 mL/hr over 90 Minutes Intravenous Every 24 hours 02/08/24 0815     02/07/24 1000  levofloxacin (LEVAQUIN) IVPB 750 mg  Status:  Discontinued        750 mg 100 mL/hr over 90 Minutes Intravenous Every 48 hours 02/07/24 0914 02/08/24 0815              Family Communication/Anticipated D/C date and plan/Code Status   DVT prophylaxis: enoxaparin (LOVENOX) injection 40 mg Start: 02/07/24 1000     Code Status: Full Code  Family Communication: None Disposition Plan: Plan to discharge home   Status is: Inpatient Remains inpatient appropriate because: Hydronephrosis,  pyelonephritis       Subjective:   Interval events noted.  She still has some right flank pain but she feels a little better today..  Objective:    Vitals:   02/08/24 1726 02/08/24 1920 02/09/24 0509 02/09/24 0726  BP: 133/71 128/75 112/70 102/68  Pulse: 95 86 96 85  Resp:  20 16 16   Temp: 98.5 F (36.9 C) 98.4 F (36.9 C) 98.8 F (37.1 C) 98.7 F (37.1 C)  TempSrc: Oral Oral Oral Oral  SpO2: 100% 98% 99% 98%  Weight:      Height:       No data found.   Intake/Output Summary (Last 24 hours) at 02/09/2024 1012 Last data filed at 02/08/2024 1920 Gross per 24 hour  Intake 555 ml  Output --  Net 555 ml   Filed Weights   02/07/24 0251  Weight: 59.8 kg    Exam:  GEN: NAD SKIN: Warm and dry EYES: No pallor or icterus ENT: MMM CV: RRR PULM: CTA B ABD: soft, ND, NT, +BS CNS: AAO x 3, non focal EXT: No edema or tenderness GU: Mild right  CVA tenderness     Data Reviewed:   I have personally reviewed following labs and imaging studies:  Labs: Labs show the following:   Basic Metabolic Panel: Recent Labs  Lab 02/03/24 0409 02/04/24 0345 02/07/24 0410 02/08/24 0402 02/09/24 0223  NA 133* 133* 133* 129* 133*  K 4.1 5.0 4.3 4.4 4.3  CL 96* 97* 94* 94* 96*  CO2 27 26 26 25 26   GLUCOSE 108* 118* 121* 106* 107*  BUN 20 25* 18 12 14   CREATININE 0.83 0.91 1.06* 0.95 0.97  CALCIUM 9.5 10.0 10.1 9.1 9.6   GFR Estimated Creatinine Clearance: 51.3 mL/min (by C-G formula based on SCr of 0.97 mg/dL). Liver Function Tests: Recent Labs  Lab 02/07/24 0410 02/08/24 0402  AST 36 34  ALT 94* 64*  ALKPHOS 122 98  BILITOT 0.5 0.6  PROT 9.0* 6.9  ALBUMIN 2.8* 2.2*   Recent Labs  Lab 02/07/24 0255  LIPASE 19   No results for input(s): "AMMONIA" in the last 168 hours. Coagulation profile Recent Labs  Lab 02/07/24 0256  INR 1.3*    CBC: Recent Labs  Lab 02/03/24 0409 02/04/24 0345 02/07/24 0255 02/08/24 0402 02/09/24 0223  WBC 11.7* 10.9*  14.2* 17.2* 11.2*  NEUTROABS  --   --  10.6*  --  8.2*  HGB 8.3* 7.5* 8.8* 7.0* 8.5*  HCT 24.9* 23.0* 27.4* 21.1* 25.7*  MCV 83.8 84.6 84.3 81.2 82.6  PLT 670* 656* 660* 633* 628*   Cardiac Enzymes: No results for input(s): "CKTOTAL", "CKMB", "CKMBINDEX", "TROPONINI" in the last 168 hours. BNP (last 3 results) No results for input(s): "PROBNP" in the last 8760 hours. CBG: No results for input(s): "GLUCAP" in the last 168 hours. D-Dimer: No results for input(s): "DDIMER" in the last 72 hours. Hgb A1c: No results for input(s): "HGBA1C" in the last 72 hours. Lipid Profile: No results for input(s): "CHOL", "HDL", "LDLCALC", "TRIG", "CHOLHDL", "LDLDIRECT" in the last 72 hours. Thyroid function studies: No results for input(s): "TSH", "T4TOTAL", "T3FREE", "THYROIDAB" in the last 72 hours.  Invalid input(s): "FREET3" Anemia work up: No results for input(s): "VITAMINB12", "FOLATE", "FERRITIN", "TIBC", "IRON", "RETICCTPCT" in the last 72 hours. Sepsis Labs: Recent Labs  Lab 02/04/24 0345 02/07/24 0255 02/07/24 0429 02/08/24 0402 02/09/24 0223  WBC 10.9* 14.2*  --  17.2* 11.2*  LATICACIDVEN  --   --  1.1  --   --     Microbiology Recent Results (from the past 240 hours)  Blood Culture (routine x 2)     Status: None (Preliminary result)   Collection Time: 02/07/24  4:29 AM   Specimen: BLOOD  Result Value Ref Range Status   Specimen Description BLOOD BLOOD LEFT ARM  Final   Special Requests   Final    BOTTLES DRAWN AEROBIC AND ANAEROBIC Blood Culture results may not be optimal due to an inadequate volume of blood received in culture bottles   Culture   Final    NO GROWTH 2 DAYS Performed at [facility], [address] [place], [place] [address]    Report Status PENDING  Incomplete  Blood Culture (routine x 2)     Status: None (Preliminary result)   Collection Time: 02/07/24  4:29 AM   Specimen: BLOOD  Result Value Ref Range Status   Specimen Description BLOOD  BLOOD LEFT ARM  Final   Special Requests   Final    BOTTLES DRAWN AEROBIC AND ANAEROBIC Blood Culture adequate volume   Culture   Final  NO GROWTH 2 DAYS Performed at [facility], [address] [place], [place] [address]    Report Status PENDING  Incomplete    Procedures and diagnostic studies:  No results found.              LOS: 1 day   Tora Prunty  [facility]   Pager on [email or web address]. If 7PM-7AM, please contact night-coverage at [email or web address]     02/09/2024, 10:12 AM

## 2024-02-09 NOTE — Plan of Care (Signed)
   Problem: Education: Goal: Knowledge of General Education information will improve Description: Including pain rating scale, medication(s)/side effects and non-pharmacologic comfort measures Outcome: Progressing   Problem: Activity: Goal: Risk for activity intolerance will decrease Outcome: Progressing   Problem: Skin Integrity: Goal: Risk for impaired skin integrity will decrease Outcome: Progressing

## 2024-02-10 DIAGNOSIS — N1330 Unspecified hydronephrosis: Secondary | ICD-10-CM

## 2024-02-10 DIAGNOSIS — N12 Tubulo-interstitial nephritis, not specified as acute or chronic: Secondary | ICD-10-CM | POA: Diagnosis not present

## 2024-02-10 LAB — CBC WITH DIFFERENTIAL/PLATELET
Abs Immature Granulocytes: 0.31 10*3/uL — ABNORMAL HIGH (ref 0.00–0.07)
Basophils Absolute: 0 10*3/uL (ref 0.0–0.1)
Basophils Relative: 1 %
Eosinophils Absolute: 0.1 10*3/uL (ref 0.0–0.5)
Eosinophils Relative: 1 %
HCT: 26 % — ABNORMAL LOW (ref 36.0–46.0)
Hemoglobin: 8.6 g/dL — ABNORMAL LOW (ref 12.0–15.0)
Immature Granulocytes: 4 %
Lymphocytes Relative: 23 %
Lymphs Abs: 1.9 10*3/uL (ref 0.7–4.0)
MCH: 27.8 pg (ref 26.0–34.0)
MCHC: 33.1 g/dL (ref 30.0–36.0)
MCV: 84.1 fL (ref 80.0–100.0)
Monocytes Absolute: 0.8 10*3/uL (ref 0.1–1.0)
Monocytes Relative: 9 %
Neutro Abs: 5 10*3/uL (ref 1.7–7.7)
Neutrophils Relative %: 62 %
Platelets: 606 10*3/uL — ABNORMAL HIGH (ref 150–400)
RBC: 3.09 MIL/uL — ABNORMAL LOW (ref 3.87–5.11)
RDW: 14.8 % (ref 11.5–15.5)
WBC: 8.2 10*3/uL (ref 4.0–10.5)
nRBC: 0 % (ref 0.0–0.2)

## 2024-02-10 LAB — BASIC METABOLIC PANEL WITH GFR
Anion gap: 10 (ref 5–15)
BUN: 14 mg/dL (ref 8–23)
CO2: 25 mmol/L (ref 22–32)
Calcium: 9.4 mg/dL (ref 8.9–10.3)
Chloride: 99 mmol/L (ref 98–111)
Creatinine, Ser: 0.88 mg/dL (ref 0.44–1.00)
GFR, Estimated: >60 mL/min (ref >60)
Glucose, Bld: 126 mg/dL — ABNORMAL HIGH (ref 70–99)
Potassium: 3.9 mmol/L (ref 3.5–5.1)
Sodium: 134 mmol/L — ABNORMAL LOW (ref 135–145)

## 2024-02-10 MED ORDER — TRAMADOL HCL 50 MG PO TABS
50.0000 mg | ORAL_TABLET | Freq: Four times a day (QID) | ORAL | Status: DC | PRN
  Administered 2024-02-10 – 2024-02-11 (×4): 50 mg via ORAL
  Filled 2024-02-10 (×4): qty 1

## 2024-02-10 MED ORDER — ACETAMINOPHEN 325 MG PO TABS
650.0000 mg | ORAL_TABLET | Freq: Four times a day (QID) | ORAL | Status: DC | PRN
  Administered 2024-02-10 – 2024-02-11 (×3): 650 mg via ORAL
  Filled 2024-02-10 (×3): qty 2

## 2024-02-10 MED ORDER — LEVOFLOXACIN 750 MG PO TABS
750.0000 mg | ORAL_TABLET | Freq: Every day | ORAL | Status: DC
  Administered 2024-02-11 – 2024-02-12 (×2): 750 mg via ORAL
  Filled 2024-02-10 (×2): qty 1

## 2024-02-10 NOTE — Progress Notes (Signed)
 Urology progress  Subjective: Continues to have right flank pain.  She reports today that she has not been up out of bed at all, just to the bathroom and back.  She is not sure if she is ready for discharge.  She is worried about bouncing back.  No further fevers.  Not endorsing shortness of breath.  She has not been using I-S.  Objective: Vital signs in last 24 hours: Temp:  [97.7 F (36.5 C)-98 F (36.7 C)] 97.9 F (36.6 C) (04/15 1705) Pulse Rate:  [78-100] 89 (04/15 1705) Resp:  [17-20] 17 (04/15 1705) BP: (93-109)/(66-79) 107/79 (04/15 1705) SpO2:  [97 %-100 %] 99 % (04/15 1705)  Intake/Output from previous day: 04/14 0701 - 04/15 0700 In: 405.7 [I.V.:105.7; IV Piggyback:300] Out: -  Intake/Output this shift: No intake/output data recorded.  Physical Exam:  General:alert, appears stated age, and fatigued.  Somewhat anxious.  Lab Results: Recent Labs    02/08/24 0402 02/09/24 0223 02/10/24 0509  HGB 7.0* 8.5* 8.6*  HCT 21.1* 25.7* 26.0*   BMET Recent Labs    02/09/24 0223 02/10/24 0509  NA 133* 134*  K 4.3 3.9  CL 96* 99  CO2 26 25  GLUCOSE 107* 126*  BUN 14 14  CREATININE 0.97 0.88  CALCIUM 9.6 9.4    Studies/Results: CT CHEST W CONTRAST Result Date: 02/09/2024 CLINICAL DATA:  Pneumonia. EXAM: CT CHEST WITH CONTRAST TECHNIQUE: Multidetector CT imaging of the chest was performed during intravenous contrast administration. RADIATION DOSE REDUCTION: This exam was performed according to the departmental dose-optimization program which includes automated exposure control, adjustment of the mA and/or kV according to patient size and/or use of iterative reconstruction technique. CONTRAST:  75mL OMNIPAQUE IOHEXOL 300 MG/ML  SOLN COMPARISON:  02/07/2024 FINDINGS: Cardiovascular: Mild atheromatous vascular calcification in the aortic arch and circumflex coronary artery as well as the descending thoracic aorta. Mild cardiomegaly. Small pericardial effusion similar to  prior. Mediastinum/Nodes: Faint stranding in the mediastinum and in the subcutaneous tissues. No pathologic adenopathy. Lungs/Pleura: Continued consolidation with some volume loss noted in the right middle lobe, right lower lobe, left lower lobe, and to a lesser extent in the lingula. Particular the lingular opacity is bandlike and likely primarily from atelectasis or scarring. Air bronchograms noted in the consolidated regions. No substantial change from 02/07/2024. Centrilobular emphysema. Small right pleural effusion, increased from previous. Upper Abdomen: Complex perinephric process along the right kidney upper pole, cannot exclude abscesses. This is only partially characterized. Prominent right hydronephrosis. Stable bilateral parenchymal calcifications versus stones along the upper poles of the kidneys. Musculoskeletal: Thoracic spondylosis. Endplate sclerosis and mild endplate irregularity at the T8-9 level, likely degenerative in due to Schmorl's nodes, similar to 02/07/2024 but progressive from 04/02/2020. If the patient has symptoms suggestive of discitis than thoracic spine MRI with and without contrast would be recommended. IMPRESSION: 1. Continued consolidation with some volume loss in the right middle lobe, right lower lobe, left lower lobe, and to a lesser extent in the lingula. No substantial change from 02/07/2024. 2. Small right pleural effusion, increased from previous. 3. Complex perinephric process along the right kidney upper pole, cannot exclude abscesses. This is only partially characterized. Prominent right hydronephrosis. 4. Endplate sclerosis and mild endplate irregularity at the T8-9 level, likely degenerative in due to Schmorl's nodes, similar to 02/07/2024 but progressive from 04/02/2020. If the patient has symptoms suggestive of discitis than thoracic spine MRI with and without contrast would be recommended. 5. Mild cardiomegaly. Small pericardial effusion similar to  prior. 6. Aortic  Atherosclerosis (ICD10-I70.0) and Emphysema (ICD10-J43.9). Electronically Signed   By: Freida Jes M.D.   On: 02/09/2024 18:18   Assessment/Plan: - XGP kidney with chronic obstruction/ pyonephrosis s/p ureteral stent now on prolonged course of Levaquin per ID - Pain and discomfort related to ureteral stent and the inflamed organ itself - She has follow-up with me next week to discuss right nephrectomy.  Prefer to allow infection to die down on tissue penetrating antibiotics now status post adequate hydration of the kidney. - Primarily atelectasis posteriorly seen on chest CT, encouraged ambulation, I-S -Plan of care was discussed in detail, all the questions were answered.   LOS: 2 days   [name] 02/10/2024, 5:06 PM

## 2024-02-10 NOTE — Plan of Care (Signed)
   Problem: Health Behavior/Discharge Planning: Goal: Ability to manage health-related needs will improve Outcome: Progressing   Problem: Clinical Measurements: Goal: Ability to maintain clinical measurements within normal limits will improve Outcome: Progressing Goal: Will remain free from infection Outcome: Progressing

## 2024-02-10 NOTE — Progress Notes (Signed)
 Progress Note    Belinda Oneill  ZOX:[id number] DOB: 09-16-1961  DOA: 02/07/2024 PCP: Pcp, No      Brief Narrative:    Medical records reviewed and are as summarized below:  Belinda Oneill is a 63 y.o. female with medical history significant for GERD, renal stones, recent percutaneous nephrostomy placement on 01/15/2024 and discharged from the hospital on 01/20/2024 after hospitalization for pyelonephritis secondary to Proteus mirabilis and acute unilateral obstructive uropathy due to kidney stone.  She was discharged on 2-week course of Keflex at that time. She presented to the hospital again on 01/26/2024 because of back pain and fever.  She was readmitted to the hospital for right-sided pyelonephritis and hydronephrosis.  She was discharged home 02/06/2024 on a 3-week course of Levaquin.  However, she developed significant right-sided back and flank pain when she got home.  She came back to the hospital on 02/07/2024 because of uncontrolled pain.  Chest x-ray impression Bands of atelectasis at the lung bases, increased from abdominal CT 01/26/2024, cannot exclude superimposed infection.   CT chest abdomen and pelvis impression 1. Marked right-sided hydronephrosis is similar to prior. The extrarenal fluid collections seen along the upper pole right kidney persist but are slightly smaller with dominant collection measuring 3.7 x 2.2 cm today compared to 4.9 x 2.6 cm previously. There is a small gas bubble associated with this dominant collection which may be related to the percutaneous nephrostomy tube that was visible on the previous study although superinfection of this collection could also have this appearance. 2. Double-J right internal ureteral stent device in place with 3 mm stone in the low right renal pelvis and a 10 x 6 x 16 mm stone in the proximal right ureter adjacent to the stent. 3. Multiple right-sided renal stones again noted including a dominant 14 mm  interpolar stone and another dominant 10 mm stone in the lower pole region. 4. Nonobstructing stones left kidney. 5. Cholelithiasis. 6. Enlargement of the pulmonary outflow tract/main pulmonary arteries suggests pulmonary arterial hypertension. 7. Interval progression of collapse/consolidative disease in both lower lungs, right greater than left. Findings may reflect simple atelectasis although pneumonia is not excluded. 8. 2 mm left upper lobe pulmonary nodule. No follow-up needed if patient is low-risk.This recommendation follows the consensus statement: Guidelines for Management of Incidental Pulmonary Nodules Detected on CT Images: From the Fleischner Society 2017; Radiology 2017; 284:228-243.       Assessment/Plan:   Principal Problem:   Pyelonephritis Active Problems:   Complicated UTI (urinary tract infection)   Sepsis (HCC)   Tobacco abuse   GERD without esophagitis    Body mass index is 22.63 kg/m.    Acute right pyelonephritis: Leukocytosis has resolved.  Continue IV Levaquin.  Plan to treat for 3 weeks through 02/24/2024.  Levaquin was started on 02/03/2024.  May be discharged in the next 1 to 2 days to complete treatment at home when pain is adequately controlled.  Recent discharge from the hospital on 02/06/2024 after completing 6 days of IV ceftriaxone for 2 days of cefadroxil and 2 days of Levaquin. Recent urine culture from 01/15/2024 showed Proteus mirabilis which was pansensitive.    Persistent right large hydronephrosis, extrarenal fluid collections along the upper pole right kidney, double-J right ureteral stent in place with proximal right ureteral stone adjacent to stent, known bilateral renal calculi: Reached out to Dr. Apolinar Junes, urologist, via secure chat.  She explained that patient would have to complete antibiotics and allow the  kidney to "cool down" before nephrectomy. P CT chest with contrast on 02/09/2024 showed persistent right hydronephrosis and  lesions concerning for right perinephric abscesses  Continue Flomax and oxybutynin as needed for stent discomfort S/p right ureteral stent placed on 02/03/2024 (prior to this admission) Previously had right nephrostomy tube was placed on 01/15/2024 but this accidentally came out on 02/01/2024 while ambulating.     Right middle lobe, right lower lobe and left lower lobe consolidations: These are likely due to atelectasis.  Pneumonia has been excluded.  No respiratory symptoms.   Acute on chronic anemia: H&H stable.  Hemoglobin 8.6.  S/p transfusion of 1 unit of PRBCs on 02/08/2024 for hemoglobin of 7.   Leukocytosis: Resolved Thrombocytosis: This is likely reactive from underlying infection   Shortness of breath: Improved.  She said blood transfusion made her feel better.   Hyponatremia: Fluctuating but stable sodium level.  This appears to be chronic.       Comorbidities include GERD, tobacco use disorder, chronic anemia, insomnia              Diet Order             Diet Heart Fluid consistency: Thin  Diet effective now                            Consultants: Urologist  Procedures: None    Medications:    enoxaparin (LOVENOX) injection  40 mg Subcutaneous Q24H   pantoprazole  40 mg Oral Daily   polyethylene glycol  17 g Oral Daily   tamsulosin  0.4 mg Oral QPC supper   Continuous Infusions:  levofloxacin (LEVAQUIN) IV 750 mg (02/10/24 0954)     Anti-infectives (From admission, onward)    Start     Dose/Rate Route Frequency Ordered Stop   02/08/24 1000  levofloxacin (LEVAQUIN) IVPB 750 mg        750 mg 100 mL/hr over 90 Minutes Intravenous Every 24 hours 02/08/24 0815     02/07/24 1000  levofloxacin (LEVAQUIN) IVPB 750 mg  Status:  Discontinued        750 mg 100 mL/hr over 90 Minutes Intravenous Every 48 hours 02/07/24 0914 02/08/24 0815              Family Communication/Anticipated D/C date and plan/Code Status   DVT  prophylaxis: enoxaparin (LOVENOX) injection 40 mg Start: 02/07/24 1000     Code Status: Full Code  Family Communication: None Disposition Plan: Plan to discharge home   Status is: Inpatient Remains inpatient appropriate because: Hydronephrosis, pyelonephritis       Subjective:   No acute events overnight.  She complains of pain in the right mid back/right flank.  No shortness of breath, cough, hemoptysis or chest pain.  No abdominal pain or vomiting  Objective:    Vitals:   02/09/24 1945 02/10/24 0239 02/10/24 0843 02/10/24 0849  BP: 104/68 109/74 93/70 94/66   Pulse: 100 85 82 78  Resp: 20 20 17    Temp: 98 F (36.7 C) 98 F (36.7 C) 97.7 F (36.5 C)   TempSrc: Oral Oral Oral   SpO2: 100% 97% 97%   Weight:      Height:       No data found.   Intake/Output Summary (Last 24 hours) at 02/10/2024 1036 Last data filed at 02/09/2024 1515 Gross per 24 hour  Intake 405.65 ml  Output --  Net 405.65 ml   [employer]  02/07/24 0251  Weight: 59.8 kg    Exam:  GEN: NAD SKIN: Warm and dry EYES: No pallor or icterus ENT: MMM CV: RRR PULM: CTA B ABD: soft, ND, NT, +BS CNS: AAO x 3, non focal EXT: No edema or tenderness GU: Mild right CVA tenderness     Data Reviewed:   I have personally reviewed following labs and imaging studies:  Labs: Labs show the following:   Basic Metabolic Panel: Recent Labs  Lab 02/04/24 0345 02/07/24 0410 02/08/24 0402 02/09/24 0223 02/10/24 0509  NA 133* 133* 129* 133* 134*  K 5.0 4.3 4.4 4.3 3.9  CL 97* 94* 94* 96* 99  CO2 26 26 25 26 25   GLUCOSE 118* 121* 106* 107* 126*  BUN 25* 18 12 14 14   CREATININE 0.91 1.06* 0.95 0.97 0.88  CALCIUM 10.0 10.1 9.1 9.6 9.4   GFR Estimated Creatinine Clearance: 56.5 mL/min (by C-G formula based on SCr of 0.88 mg/dL). Liver Function Tests: Recent Labs  Lab 02/07/24 0410 02/08/24 0402  AST 36 34  ALT 94* 64*  ALKPHOS 122 98  BILITOT 0.5 0.6  PROT 9.0* 6.9  ALBUMIN 2.8*  2.2*   Recent Labs  Lab 02/07/24 0255  LIPASE 19   No results for input(s): "AMMONIA" in the last 168 hours. Coagulation profile Recent Labs  Lab 02/07/24 0256  INR 1.3*    CBC: Recent Labs  Lab 02/04/24 0345 02/07/24 0255 02/08/24 0402 02/09/24 0223 02/10/24 0509  WBC 10.9* 14.2* 17.2* 11.2* 8.2  NEUTROABS  --  10.6*  --  8.2* 5.0  HGB 7.5* 8.8* 7.0* 8.5* 8.6*  HCT 23.0* 27.4* 21.1* 25.7* 26.0*  MCV 84.6 84.3 81.2 82.6 84.1  PLT 656* 660* 633* 628* 606*   Cardiac Enzymes: No results for input(s): "CKTOTAL", "CKMB", "CKMBINDEX", "TROPONINI" in the last 168 hours. BNP (last 3 results) No results for input(s): "PROBNP" in the last 8760 hours. CBG: No results for input(s): "GLUCAP" in the last 168 hours. D-Dimer: No results for input(s): "DDIMER" in the last 72 hours. Hgb A1c: No results for input(s): "HGBA1C" in the last 72 hours. Lipid Profile: No results for input(s): "CHOL", "HDL", "LDLCALC", "TRIG", "CHOLHDL", "LDLDIRECT" in the last 72 hours. Thyroid function studies: No results for input(s): "TSH", "T4TOTAL", "T3FREE", "THYROIDAB" in the last 72 hours.  Invalid input(s): "FREET3" Anemia work up: No results for input(s): "VITAMINB12", "FOLATE", "FERRITIN", "TIBC", "IRON", "RETICCTPCT" in the last 72 hours. Sepsis Labs: Recent Labs  Lab 02/07/24 0255 02/07/24 0429 02/08/24 0402 02/09/24 0223 02/10/24 0509  WBC 14.2*  --  17.2* 11.2* 8.2  LATICACIDVEN  --  1.1  --   --   --     Microbiology Recent Results (from the past 240 hours)  Blood Culture (routine x 2)     Status: None (Preliminary result)   Collection Time: 02/07/24  4:29 AM   Specimen: BLOOD  Result Value Ref Range Status   Specimen Description BLOOD BLOOD LEFT ARM  Final   Special Requests   Final    BOTTLES DRAWN AEROBIC AND ANAEROBIC Blood Culture results may not be optimal due to an inadequate volume of blood received in culture bottles   Culture   Final    NO GROWTH 3  DAYS Performed at [facility], [address] [place], [place] [address]    Report Status PENDING  Incomplete  Blood Culture (routine x 2)     Status: None (Preliminary result)   Collection Time: 02/07/24  4:29  AM   Specimen: BLOOD  Result Value Ref Range Status   Specimen Description BLOOD BLOOD LEFT ARM  Final   Special Requests   Final    BOTTLES DRAWN AEROBIC AND ANAEROBIC Blood Culture adequate volume   Culture   Final    NO GROWTH 3 DAYS Performed at [facility], [address] [place], [place] [address]    Report Status PENDING  Incomplete    Procedures and diagnostic studies:  CT CHEST W CONTRAST Result Date: 02/09/2024 CLINICAL DATA:  Pneumonia. EXAM: CT CHEST WITH CONTRAST TECHNIQUE: Multidetector CT imaging of the chest was performed during intravenous contrast administration. RADIATION DOSE REDUCTION: This exam was performed according to the departmental dose-optimization program which includes automated exposure control, adjustment of the mA and/or kV according to patient size and/or use of iterative reconstruction technique. CONTRAST:  75mL OMNIPAQUE IOHEXOL 300 MG/ML  SOLN COMPARISON:  02/07/2024 FINDINGS: Cardiovascular: Mild atheromatous vascular calcification in the aortic arch and circumflex coronary artery as well as the descending thoracic aorta. Mild cardiomegaly. Small pericardial effusion similar to prior. Mediastinum/Nodes: Faint stranding in the mediastinum and in the subcutaneous tissues. No pathologic adenopathy. Lungs/Pleura: Continued consolidation with some volume loss noted in the right middle lobe, right lower lobe, left lower lobe, and to a lesser extent in the lingula. Particular the lingular opacity is bandlike and likely primarily from atelectasis or scarring. Air bronchograms noted in the consolidated regions. No substantial change from 02/07/2024. Centrilobular emphysema. Small right pleural effusion, increased from previous. Upper  Abdomen: Complex perinephric process along the right kidney upper pole, cannot exclude abscesses. This is only partially characterized. Prominent right hydronephrosis. Stable bilateral parenchymal calcifications versus stones along the upper poles of the kidneys. Musculoskeletal: Thoracic spondylosis. Endplate sclerosis and mild endplate irregularity at the T8-9 level, likely degenerative in due to Schmorl's nodes, similar to 02/07/2024 but progressive from 04/02/2020. If the patient has symptoms suggestive of discitis than thoracic spine MRI with and without contrast would be recommended. IMPRESSION: 1. Continued consolidation with some volume loss in the right middle lobe, right lower lobe, left lower lobe, and to a lesser extent in the lingula. No substantial change from 02/07/2024. 2. Small right pleural effusion, increased from previous. 3. Complex perinephric process along the right kidney upper pole, cannot exclude abscesses. This is only partially characterized. Prominent right hydronephrosis. 4. Endplate sclerosis and mild endplate irregularity at the T8-9 level, likely degenerative in due to Schmorl's nodes, similar to 02/07/2024 but progressive from 04/02/2020. If the patient has symptoms suggestive of discitis than thoracic spine MRI with and without contrast would be recommended. 5. Mild cardiomegaly. Small pericardial effusion similar to prior. 6. Aortic Atherosclerosis (ICD10-I70.0) and Emphysema (ICD10-J43.9). Electronically Signed   By: Freida Jes M.D.   On: 02/09/2024 18:18                LOS: 2 days   Gera Inboden  [facility]   Pager on [email or web address]. If 7PM-7AM, please contact night-coverage at [email or web address]     02/10/2024, 10:36 AM

## 2024-02-10 NOTE — Plan of Care (Signed)
   Problem: Education: Goal: Knowledge of General Education information will improve Description: Including pain rating scale, medication(s)/side effects and non-pharmacologic comfort measures Outcome: Progressing   Problem: Clinical Measurements: Goal: Ability to maintain clinical measurements within normal limits will improve Outcome: Progressing Goal: Diagnostic test results will improve Outcome: Progressing

## 2024-02-10 NOTE — Plan of Care (Signed)
  Problem: Education: Goal: Knowledge of General Education information will improve Description: Including pain rating scale, medication(s)/side effects and non-pharmacologic comfort measures Outcome: Progressing   Problem: Health Behavior/Discharge Planning: Goal: Ability to manage health-related needs will improve Outcome: Progressing   Problem: Clinical Measurements: Goal: Ability to maintain clinical measurements within normal limits will improve Outcome: Progressing Goal: Will remain free from infection Outcome: Progressing Goal: Diagnostic test results will improve Outcome: Progressing Goal: Respiratory complications will improve Outcome: Progressing Goal: Cardiovascular complication will be avoided Outcome: Progressing   Problem: Activity: Goal: Risk for activity intolerance will decrease Outcome: Progressing   Problem: Nutrition: Goal: Adequate nutrition will be maintained Outcome: Progressing   Problem: Coping: Goal: Level of anxiety will decrease Outcome: Progressing   Problem: Elimination: Goal: Will not experience complications related to bowel motility Outcome: Progressing Goal: Will not experience complications related to urinary retention Outcome: Progressing   Problem: Safety: Goal: Ability to remain free from injury will improve Outcome: Progressing   Problem: Skin Integrity: Goal: Risk for impaired skin integrity will decrease Outcome: Progressing   Problem: Fluid Volume: Goal: Hemodynamic stability will improve Outcome: Progressing   Problem: Clinical Measurements: Goal: Diagnostic test results will improve Outcome: Progressing Goal: Signs and symptoms of infection will decrease Outcome: Progressing   Problem: Respiratory: Goal: Ability to maintain adequate ventilation will improve Outcome: Progressing   

## 2024-02-10 NOTE — Progress Notes (Signed)
error 

## 2024-02-11 DIAGNOSIS — N12 Tubulo-interstitial nephritis, not specified as acute or chronic: Secondary | ICD-10-CM | POA: Diagnosis not present

## 2024-02-11 DIAGNOSIS — A419 Sepsis, unspecified organism: Secondary | ICD-10-CM

## 2024-02-11 DIAGNOSIS — N390 Urinary tract infection, site not specified: Secondary | ICD-10-CM | POA: Diagnosis not present

## 2024-02-11 DIAGNOSIS — K219 Gastro-esophageal reflux disease without esophagitis: Secondary | ICD-10-CM

## 2024-02-11 DIAGNOSIS — Z720 Tobacco use: Secondary | ICD-10-CM

## 2024-02-11 MED ORDER — NAPROXEN 500 MG PO TABS
500.0000 mg | ORAL_TABLET | Freq: Two times a day (BID) | ORAL | Status: DC
  Administered 2024-02-11 – 2024-02-12 (×2): 500 mg via ORAL
  Filled 2024-02-11 (×4): qty 1

## 2024-02-11 MED ORDER — POLYSACCHARIDE IRON COMPLEX 150 MG PO CAPS
150.0000 mg | ORAL_CAPSULE | Freq: Every day | ORAL | Status: DC
  Administered 2024-02-11 – 2024-02-12 (×2): 150 mg via ORAL
  Filled 2024-02-11 (×2): qty 1

## 2024-02-11 MED ORDER — MELATONIN 5 MG PO TABS
5.0000 mg | ORAL_TABLET | Freq: Every day | ORAL | Status: DC
  Administered 2024-02-11 (×2): 5 mg via ORAL
  Filled 2024-02-11 (×2): qty 1

## 2024-02-11 NOTE — Progress Notes (Signed)
 Progress Note    Belinda Oneill  ZOX:[id number] DOB: 1961-03-02  DOA: 02/07/2024 PCP: Pcp, No      Brief Narrative:    Medical records reviewed and are as summarized below:  Belinda Oneill is a 63 y.o. female with medical history significant for GERD, renal stones, recent percutaneous nephrostomy placement on 01/15/2024 and discharged from the hospital on 01/20/2024 after hospitalization for pyelonephritis secondary to Proteus mirabilis and acute unilateral obstructive uropathy due to kidney stone.  She was discharged on 2-week course of Keflex at that time. She presented to the hospital again on 01/26/2024 because of back pain and fever.  She was readmitted to the hospital for right-sided pyelonephritis and hydronephrosis.  She was discharged home 02/06/2024 on a 3-week course of Levaquin.  However, she developed significant right-sided back and flank pain when she got home.  She came back to the hospital on 02/07/2024 because of uncontrolled pain.  4/15: Continue to have some pain and doesn't think is ready to go home.  Patient thinks that tramadol is making her sweat so it was discontinued-adding twice daily naproxen 500 mg to see if that will help.  Chest x-ray impression Bands of atelectasis at the lung bases, increased from abdominal CT 01/26/2024, cannot exclude superimposed infection.   CT chest abdomen and pelvis impression 1. Marked right-sided hydronephrosis is similar to prior. The extrarenal fluid collections seen along the upper pole right kidney persist but are slightly smaller with dominant collection measuring 3.7 x 2.2 cm today compared to 4.9 x 2.6 cm previously. There is a small gas bubble associated with this dominant collection which may be related to the percutaneous nephrostomy tube that was visible on the previous study although superinfection of this collection could also have this appearance. 2. Double-J right internal ureteral stent device in place with  3 mm stone in the low right renal pelvis and a 10 x 6 x 16 mm stone in the proximal right ureter adjacent to the stent. 3. Multiple right-sided renal stones again noted including a dominant 14 mm interpolar stone and another dominant 10 mm stone in the lower pole region. 4. Nonobstructing stones left kidney. 5. Cholelithiasis. 6. Enlargement of the pulmonary outflow tract/main pulmonary arteries suggests pulmonary arterial hypertension. 7. Interval progression of collapse/consolidative disease in both lower lungs, right greater than left. Findings may reflect simple atelectasis although pneumonia is not excluded. 8. 2 mm left upper lobe pulmonary nodule. No follow-up needed if patient is low-risk.This recommendation follows the consensus statement: Guidelines for Management of Incidental Pulmonary Nodules Detected on CT Images: From the Fleischner Society 2017; Radiology 2017; 284:228-243.       Assessment/Plan:   Principal Problem:   Pyelonephritis Active Problems:   Complicated UTI (urinary tract infection)   Sepsis (HCC)   Tobacco abuse   GERD without esophagitis    Body mass index is 22.63 kg/m.    Acute right pyelonephritis: Leukocytosis has resolved.  Continue IV Levaquin.  Plan to treat for 3 weeks through 02/24/2024.  Levaquin was started on 02/03/2024.  May be discharged in the next 1 to 2 days to complete treatment at home when pain is adequately controlled.  Recent discharge from the hospital on 02/06/2024 after completing 6 days of IV ceftriaxone for 2 days of cefadroxil and 2 days of Levaquin. Recent urine culture from 01/15/2024 showed Proteus mirabilis which was pansensitive.    Persistent right large hydronephrosis, extrarenal fluid collections along the upper pole right kidney, double-J  right ureteral stent in place with proximal right ureteral stone adjacent to stent, known bilateral renal calculi: Reached out to Dr. Apolinar Junes, urologist, via secure chat.  She  explained that patient would have to complete antibiotics and allow the kidney to "cool down" before nephrectomy. P CT chest with contrast on 02/09/2024 showed persistent right hydronephrosis and lesions concerning for right perinephric abscesses  Continue Flomax and oxybutynin as needed for stent discomfort Continue Levaquin-until 02/24/2024 S/p right ureteral stent placed on 02/03/2024 (prior to this admission) Previously had right nephrostomy tube was placed on 01/15/2024 but this accidentally came out on 02/01/2024 while ambulating.    Right middle lobe, right lower lobe and left lower lobe consolidations: These are likely due to atelectasis.  Pneumonia has been excluded.  No respiratory symptoms.   Acute on chronic anemia: H&H stable.  Hemoglobin 8.6.  S/p transfusion of 1 unit of PRBCs on 02/08/2024 for hemoglobin of 7.   Leukocytosis: Resolved Thrombocytosis: This is likely reactive from underlying infection   Shortness of breath: Improved.  She said blood transfusion made her feel better.   Hyponatremia: Fluctuating but stable sodium level.  This appears to be chronic.     Comorbidities include GERD, tobacco use disorder, chronic anemia, insomnia     Diet Order             Diet Heart Fluid consistency: Thin  Diet effective now                   Consultants: Urologist  Procedures: None    Medications:    enoxaparin (LOVENOX) injection  40 mg Subcutaneous Q24H   iron polysaccharides  150 mg Oral Daily   levofloxacin  750 mg Oral Daily   melatonin  5 mg Oral QHS   naproxen  500 mg Oral BID WC   pantoprazole  40 mg Oral Daily   polyethylene glycol  17 g Oral Daily   tamsulosin  0.4 mg Oral QPC supper   Continuous Infusions:  Anti-infectives (From admission, onward)    Start     Dose/Rate Route Frequency Ordered Stop   02/11/24 1000  levofloxacin (LEVAQUIN) tablet 750 mg        750 mg Oral Daily 02/10/24 1306     02/08/24 1000  levofloxacin (LEVAQUIN) IVPB  750 mg  Status:  Discontinued        750 mg 100 mL/hr over 90 Minutes Intravenous Every 24 hours 02/08/24 0815 02/10/24 1306   02/07/24 1000  levofloxacin (LEVAQUIN) IVPB 750 mg  Status:  Discontinued        750 mg 100 mL/hr over 90 Minutes Intravenous Every 48 hours 02/07/24 0914 02/08/24 0815       Family Communication/Anticipated D/C date and plan/Code Status   DVT prophylaxis: enoxaparin (LOVENOX) injection 40 mg Start: 02/07/24 1000     Code Status: Full Code  Family Communication:  Disposition Plan: Plan to discharge home   Status is: Inpatient Remains inpatient appropriate because: Hydronephrosis, pyelonephritis  Subjective:   Patient continued to have some flank pain.  She was having some night sweats, patient thinks that tramadol is making her sweat so it was discontinued. Objective:    Vitals:   02/10/24 1705 02/10/24 2023 02/11/24 0400 02/11/24 0840  BP: 107/79 123/76 120/76 114/78  Pulse: 89 86 84 72  Resp: 17 15 20 15   Temp: 97.9 F (36.6 C) 98.3 F (36.8 C) 98.8 F (37.1 C) 98.1 F (36.7 C)  TempSrc: Oral  Oral  SpO2: 99% 98% 99% 98%  Weight:      Height:       No data found.  No intake or output data in the 24 hours ending 02/11/24 1650  Filed Weights   02/07/24 0251  Weight: 59.8 kg    Exam:  General.  Frail lady, in no acute distress. Pulmonary.  Lungs clear bilaterally, normal respiratory effort. CV.  Regular rate and rhythm, no JVD, rub or murmur. Abdomen.  Soft, nontender, nondistended, BS positive. CNS.  Alert and oriented .  No focal neurologic deficit. Extremities.  No edema, no cyanosis, pulses intact and symmetrical. Psychiatry.  Judgment and insight appears normal.     Data Reviewed:   I have personally reviewed following labs and imaging studies:  Labs: Labs show the following:   Basic Metabolic Panel: Recent Labs  Lab 02/07/24 0410 02/08/24 0402 02/09/24 0223 02/10/24 0509  NA 133* 129* 133* 134*  K 4.3 4.4  4.3 3.9  CL 94* 94* 96* 99  CO2 26 25 26 25   GLUCOSE 121* 106* 107* 126*  BUN 18 12 14 14   CREATININE 1.06* 0.95 0.97 0.88  CALCIUM 10.1 9.1 9.6 9.4   GFR Estimated Creatinine Clearance: 56.5 mL/min (by C-G formula based on SCr of 0.88 mg/dL). Liver Function Tests: Recent Labs  Lab 02/07/24 0410 02/08/24 0402  AST 36 34  ALT 94* 64*  ALKPHOS 122 98  BILITOT 0.5 0.6  PROT 9.0* 6.9  ALBUMIN 2.8* 2.2*   Recent Labs  Lab 02/07/24 0255  LIPASE 19   No results for input(s): "AMMONIA" in the last 168 hours. Coagulation profile Recent Labs  Lab 02/07/24 0256  INR 1.3*    CBC: Recent Labs  Lab 02/07/24 0255 02/08/24 0402 02/09/24 0223 02/10/24 0509  WBC 14.2* 17.2* 11.2* 8.2  NEUTROABS 10.6*  --  8.2* 5.0  HGB 8.8* 7.0* 8.5* 8.6*  HCT 27.4* 21.1* 25.7* 26.0*  MCV 84.3 81.2 82.6 84.1  PLT 660* 633* 628* 606*   Cardiac Enzymes: No results for input(s): "CKTOTAL", "CKMB", "CKMBINDEX", "TROPONINI" in the last 168 hours. BNP (last 3 results) No results for input(s): "PROBNP" in the last 8760 hours. CBG: No results for input(s): "GLUCAP" in the last 168 hours. D-Dimer: No results for input(s): "DDIMER" in the last 72 hours. Hgb A1c: No results for input(s): "HGBA1C" in the last 72 hours. Lipid Profile: No results for input(s): "CHOL", "HDL", "LDLCALC", "TRIG", "CHOLHDL", "LDLDIRECT" in the last 72 hours. Thyroid function studies: No results for input(s): "TSH", "T4TOTAL", "T3FREE", "THYROIDAB" in the last 72 hours.  Invalid input(s): "FREET3" Anemia work up: No results for input(s): "VITAMINB12", "FOLATE", "FERRITIN", "TIBC", "IRON", "RETICCTPCT" in the last 72 hours. Sepsis Labs: Recent Labs  Lab 02/07/24 0255 02/07/24 0429 02/08/24 0402 02/09/24 0223 02/10/24 0509  WBC 14.2*  --  17.2* 11.2* 8.2  LATICACIDVEN  --  1.1  --   --   --     Microbiology Recent Results (from the past 240 hours)  Blood Culture (routine x 2)     Status: None (Preliminary  result)   Collection Time: 02/07/24  4:29 AM   Specimen: BLOOD  Result Value Ref Range Status   Specimen Description BLOOD BLOOD LEFT ARM  Final   Special Requests   Final    BOTTLES DRAWN AEROBIC AND ANAEROBIC Blood Culture results may not be optimal due to an inadequate volume of blood received in culture bottles   Culture   Final    NO GROWTH  4 DAYS Performed at [facility], [address] [place], [place] [address]    Report Status PENDING  Incomplete  Blood Culture (routine x 2)     Status: None (Preliminary result)   Collection Time: 02/07/24  4:29 AM   Specimen: BLOOD  Result Value Ref Range Status   Specimen Description BLOOD BLOOD LEFT ARM  Final   Special Requests   Final    BOTTLES DRAWN AEROBIC AND ANAEROBIC Blood Culture adequate volume   Culture   Final    NO GROWTH 4 DAYS Performed at [facility], [address], [place], [place] [address]    Report Status PENDING  Incomplete    Procedures and diagnostic studies:  No results found.    LOS: 3 days   Luna Salinas MD  [facility]   Pager on [email or web address]. If 7PM-7AM, please contact night-coverage at [email or web address]  02/11/2024, 4:50 PM

## 2024-02-11 NOTE — Plan of Care (Signed)

## 2024-02-11 NOTE — Progress Notes (Signed)
 Pt has had multiple episodes of diaphoresis overnight- I have checked her temperature multiple times and she has remained afebrile.

## 2024-02-12 DIAGNOSIS — R52 Pain, unspecified: Principal | ICD-10-CM

## 2024-02-12 DIAGNOSIS — N151 Renal and perinephric abscess: Secondary | ICD-10-CM

## 2024-02-12 DIAGNOSIS — Z960 Presence of urogenital implants: Secondary | ICD-10-CM

## 2024-02-12 DIAGNOSIS — N12 Tubulo-interstitial nephritis, not specified as acute or chronic: Secondary | ICD-10-CM | POA: Diagnosis not present

## 2024-02-12 DIAGNOSIS — N1330 Unspecified hydronephrosis: Secondary | ICD-10-CM | POA: Diagnosis not present

## 2024-02-12 LAB — URINALYSIS, COMPLETE (UACMP) WITH MICROSCOPIC
Bilirubin Urine: NEGATIVE
Glucose, UA: NEGATIVE mg/dL
Ketones, ur: NEGATIVE mg/dL
Nitrite: NEGATIVE
Protein, ur: 100 mg/dL — AB
RBC / HPF: >50 RBC/hpf (ref 0–5)
Specific Gravity, Urine: 1.016 (ref 1.005–1.030)
WBC, UA: >50 WBC/hpf (ref 0–5)
pH: 6 (ref 5.0–8.0)

## 2024-02-12 LAB — CULTURE, BLOOD (ROUTINE X 2)
Culture: NO GROWTH
Culture: NO GROWTH
Special Requests: ADEQUATE

## 2024-02-12 MED ORDER — OXYBUTYNIN CHLORIDE 5 MG PO TABS: 5.0000 mg | ORAL_TABLET | Freq: Three times a day (TID) | ORAL | 0 refills | Status: DC | PRN

## 2024-02-12 MED ORDER — TRAZODONE HCL 50 MG PO TABS: 50.0000 mg | ORAL_TABLET | Freq: Every evening | ORAL | 0 refills | Status: DC | PRN

## 2024-02-12 MED ORDER — OXYCODONE-ACETAMINOPHEN 5-325 MG PO TABS: 1.0000 | ORAL_TABLET | Freq: Three times a day (TID) | ORAL | 0 refills | Status: DC | PRN

## 2024-02-12 MED ORDER — LEVOFLOXACIN 750 MG PO TABS: 750.0000 mg | ORAL_TABLET | Freq: Every day | ORAL | 0 refills | Status: DC

## 2024-02-12 MED ORDER — POLYETHYLENE GLYCOL 3350 17 G PO PACK: 17.0000 g | PACK | Freq: Every day | ORAL | 0 refills | Status: DC | PRN

## 2024-02-12 MED ORDER — NAPROXEN 500 MG PO TABS: 500.0000 mg | ORAL_TABLET | Freq: Two times a day (BID) | ORAL | 0 refills | Status: DC

## 2024-02-12 NOTE — Plan of Care (Signed)

## 2024-02-12 NOTE — Discharge Summary (Signed)
 Physician Discharge Summary   Patient: Belinda Oneill MRN: [id number] DOB: [birth year]  Admit date:     02/07/2024  Discharge date: 02/12/24  Discharge Physician: Luna Salinas   PCP: Pcp, No   Recommendations at discharge:   Please obtain CBC and BMP and follow-up Please ensure the completion of antibiotics Follow-up with urology for definitive treatment of persistent hydronephrosis and a concern of perinephric abscess Follow-up with primary care provider  Discharge Diagnoses: Principal Problem:   Pyelonephritis Active Problems:   Complicated UTI (urinary tract infection)   Sepsis (HCC)   Tobacco abuse   GERD without esophagitis   Hydronephrosis of left kidney   Intractable pain   Perinephric abscess   Ureteral stent present   Hospital Course: Belinda Oneill is a 63 y.o. female with medical history significant for GERD, renal stones, recent percutaneous nephrostomy placement on 01/15/2024 and discharged from the hospital on 01/20/2024 after hospitalization for pyelonephritis secondary to Proteus mirabilis and acute unilateral obstructive uropathy due to kidney stone.  She was discharged on 2-week course of Keflex at that time. She presented to the hospital again on 01/26/2024 because of back pain and fever.  She was readmitted to the hospital for right-sided pyelonephritis and hydronephrosis.  She was discharged home 02/06/2024 on a 3-week course of Levaquin.  However, she developed significant right-sided back and flank pain when she got home.  She came back to the hospital on 02/07/2024 because of uncontrolled pain.  CT chest abdomen and pelvis impression 1. Marked right-sided hydronephrosis is similar to prior. The extrarenal fluid collections seen along the upper pole right kidney persist but are slightly smaller with dominant collection measuring 3.7 x 2.2 cm today compared to 4.9 x 2.6 cm previously. There is a small gas bubble associated with this dominant collection which  may be related to the percutaneous nephrostomy tube that was visible on the previous study although superinfection of this collection could also have this appearance. 2. Double-J right internal ureteral stent device in place with 3 mm stone in the low right renal pelvis and a 10 x 6 x 16 mm stone in the proximal right ureter adjacent to the stent. 3. Multiple right-sided renal stones again noted including a dominant 14 mm interpolar stone and another dominant 10 mm stone in the lower pole region. 4. Nonobstructing stones left kidney. 5. Cholelithiasis. 6. Enlargement of the pulmonary outflow tract/main pulmonary arteries suggests pulmonary arterial hypertension. 7. Interval progression of collapse/consolidative disease in both lower lungs, right greater than left. Findings may reflect simple atelectasis although pneumonia is not excluded. 8. 2 mm left upper lobe pulmonary nodule. No follow-up needed if patient is low-risk.This recommendation follows the consensus statement: Guidelines for Management of Incidental Pulmonary Nodules Detected on CT Images: From the Fleischner Society 2017; Radiology 2017; 284:228-243.  Her leukocytosis resolved.  She received IV Levaquin and is being discharged on p.o. Levaquin until 02/24/2024.  Patient need to have a close follow-up with urology for further assistance and definitive treatment of her persistent hydronephrosis, obstruction and a concern of perinephric abscess.  There was some concern of right middle and lower lobe consolidations, likely atelectasis as there was no respiratory symptoms and patient is also taking Levaquin.  Patient was found to have acute on chronic anemia and received 1 unit of PRBC on 02/08/2024.  Leukocytosis has been resolved.  Patient has mild hyponatremia which fluctuates.  She will continue the rest of her home medications and need to have a close follow-up with  her providers for further assistance.   Pain control -  [employer] Controlled Substance Reporting System database was reviewed. and patient was instructed, not to drive, operate heavy machinery, perform activities at heights, swimming or participation in water activities or provide baby-sitting services while on Pain, Sleep and Anxiety Medications; until their outpatient Physician has advised to do so again. Also recommended to not to take more than prescribed Pain, Sleep and Anxiety Medications.  Consultants: Urology Procedures performed: None Disposition: Home Diet recommendation:  Discharge Diet Orders (From admission, onward)     Start     Ordered   02/12/24 0000  Diet - low sodium heart healthy        02/12/24 0940           Regular diet DISCHARGE MEDICATION: Allergies as of 02/12/2024   No Known Allergies      Medication List     TAKE these medications    acetaminophen 500 MG tablet Commonly known as: TYLENOL Take 2 tablets (1,000 mg total) by mouth every 6 (six) hours as needed.   famotidine 20 MG tablet Commonly known as: PEPCID Take 1 tablet (20 mg total) by mouth 2 (two) times daily.   Ferrex 150 150 MG capsule Generic drug: iron polysaccharides Take 1 capsule (150 mg total) by mouth daily.   levofloxacin 750 MG tablet Commonly known as: LEVAQUIN Take 1 tablet (750 mg total) by mouth daily for 14 days. What changed:  medication strength how much to take   naproxen 500 MG tablet Commonly known as: NAPROSYN Take 1 tablet (500 mg total) by mouth 2 (two) times daily with a meal.   oxybutynin 5 MG tablet Commonly known as: DITROPAN Take 1 tablet (5 mg total) by mouth every 8 (eight) hours as needed for bladder spasms.   oxyCODONE-acetaminophen 5-325 MG tablet Commonly known as: PERCOCET/ROXICET Take 1 tablet by mouth every 8 (eight) hours as needed for moderate pain (pain score 4-6).   pantoprazole 40 MG tablet Commonly known as: Protonix Take 1 tablet (40 mg total) by mouth daily.   polyethylene  glycol 17 g packet Commonly known as: MIRALAX / GLYCOLAX Take 17 g by mouth daily as needed for mild constipation or moderate constipation.   Stimulant Laxative 8.6-50 MG tablet Generic drug: senna-docusate Take 2 tablets by mouth 2 (two) times daily as needed for mild constipation.   tamsulosin 0.4 MG Caps capsule Commonly known as: FLOMAX Take 1 capsule (0.4 mg total) by mouth daily after supper.   traZODone 50 MG tablet Commonly known as: DESYREL Take 1 tablet (50 mg total) by mouth at bedtime as needed for up to 7 days for sleep.        Discharge Exam: Filed Weights   02/07/24 0251  Weight: 59.8 kg   General.  Well-developed lady, in no acute distress. Pulmonary.  Lungs clear bilaterally, normal respiratory effort. CV.  Regular rate and rhythm, no JVD, rub or murmur. Abdomen.  Soft, nontender, nondistended, BS positive. CNS.  Alert and oriented .  No focal neurologic deficit. Extremities.  No edema, no cyanosis, pulses intact and symmetrical. Psychiatry.  Judgment and insight appears normal.   Condition at discharge: stable  The results of significant diagnostics from this hospitalization (including imaging, microbiology, ancillary and laboratory) are listed below for reference.   Imaging Studies: CT CHEST W CONTRAST Result Date: 02/09/2024 CLINICAL DATA:  Pneumonia. EXAM: CT CHEST WITH CONTRAST TECHNIQUE: Multidetector CT imaging of the chest was performed during intravenous contrast administration.  RADIATION DOSE REDUCTION: This exam was performed according to the departmental dose-optimization program which includes automated exposure control, adjustment of the mA and/or kV according to patient size and/or use of iterative reconstruction technique. CONTRAST:  75mL OMNIPAQUE IOHEXOL 300 MG/ML  SOLN COMPARISON:  02/07/2024 FINDINGS: Cardiovascular: Mild atheromatous vascular calcification in the aortic arch and circumflex coronary artery as well as the descending thoracic  aorta. Mild cardiomegaly. Small pericardial effusion similar to prior. Mediastinum/Nodes: Faint stranding in the mediastinum and in the subcutaneous tissues. No pathologic adenopathy. Lungs/Pleura: Continued consolidation with some volume loss noted in the right middle lobe, right lower lobe, left lower lobe, and to a lesser extent in the lingula. Particular the lingular opacity is bandlike and likely primarily from atelectasis or scarring. Air bronchograms noted in the consolidated regions. No substantial change from 02/07/2024. Centrilobular emphysema. Small right pleural effusion, increased from previous. Upper Abdomen: Complex perinephric process along the right kidney upper pole, cannot exclude abscesses. This is only partially characterized. Prominent right hydronephrosis. Stable bilateral parenchymal calcifications versus stones along the upper poles of the kidneys. Musculoskeletal: Thoracic spondylosis. Endplate sclerosis and mild endplate irregularity at the T8-9 level, likely degenerative in due to Schmorl's nodes, similar to 02/07/2024 but progressive from 04/02/2020. If the patient has symptoms suggestive of discitis than thoracic spine MRI with and without contrast would be recommended. IMPRESSION: 1. Continued consolidation with some volume loss in the right middle lobe, right lower lobe, left lower lobe, and to a lesser extent in the lingula. No substantial change from 02/07/2024. 2. Small right pleural effusion, increased from previous. 3. Complex perinephric process along the right kidney upper pole, cannot exclude abscesses. This is only partially characterized. Prominent right hydronephrosis. 4. Endplate sclerosis and mild endplate irregularity at the T8-9 level, likely degenerative in due to Schmorl's nodes, similar to 02/07/2024 but progressive from 04/02/2020. If the patient has symptoms suggestive of discitis than thoracic spine MRI with and without contrast would be recommended. 5. Mild  cardiomegaly. Small pericardial effusion similar to prior. 6. Aortic Atherosclerosis (ICD10-I70.0) and Emphysema (ICD10-J43.9). Electronically Signed   By: Gaylyn Rong M.D.   On: 02/09/2024 18:18   CT CHEST ABDOMEN PELVIS W CONTRAST Result Date: 02/07/2024 CLINICAL DATA:  Worsening pain throughout the chest and abdomen. Multiple recent urological procedures. EXAM: CT CHEST, ABDOMEN, AND PELVIS WITH CONTRAST TECHNIQUE: Multidetector CT imaging of the chest, abdomen and pelvis was performed following the standard protocol during bolus administration of intravenous contrast. RADIATION DOSE REDUCTION: This exam was performed according to the departmental dose-optimization program which includes automated exposure control, adjustment of the mA and/or kV according to patient size and/or use of iterative reconstruction technique. CONTRAST:  OMNIPAQUE IOHEXOL 300 MG/ML  SOLN COMPARISON:  Abdomen and pelvis CT 01/26/2024. FINDINGS: CT CHEST FINDINGS Cardiovascular: The heart size is upper normal to borderline enlarged. No substantial pericardial effusion. No thoracic aortic aneurysm. No substantial atherosclerosis of the thoracic aorta. Enlargement of the pulmonary outflow tract/main pulmonary arteries suggests pulmonary arterial hypertension. No large central pulmonary embolus. Mediastinum/Nodes: No mediastinal lymphadenopathy. There is no hilar lymphadenopathy. The esophagus has normal imaging features. There is no axillary lymphadenopathy. Lungs/Pleura: 2 mm left upper lobe nodule identified on image 46/5. Subtle changes of centrilobular emphysema noted. Collapse/consolidation noted in the right lung base, present previously but progressive in the interval. Progressive collapse/consolidation noted left lower lobe as well. Right middle lobe collapse/consolidation evident. No pleural effusion. Musculoskeletal: No worrisome lytic or sclerotic osseous abnormality. CT ABDOMEN PELVIS FINDINGS Hepatobiliary: No  suspicious focal abnormality within the liver parenchyma. Layering tiny calcified gallstones evident. No intrahepatic or extrahepatic biliary dilation. Pancreas: No focal mass lesion. No dilatation of the main duct. No intraparenchymal cyst. No peripancreatic edema. Spleen: No splenomegaly. No suspicious focal mass lesion. Adrenals/Urinary Tract: No adrenal nodule or mass. Marked right-sided hydronephrosis is similar to prior. The extrarenal fluid collection seen along the upper pole right kidney persist but are slightly smaller with dominant collection measuring 3.7 x 2.2 cm today compared to 4.9 x 2.6 cm previously. There is a small gas bubble associated with this dominant collection (45/3) which may be related to the percutaneous nephrostomy tube that was visible on the previous study although superinfection of this collection could also have this appearance. Multiple right-sided renal stones again noted including a dominant 14 mm interpolar stone and another dominant 10 mm stone in the lower pole region. A double-J right internal ureteral stent device is noted with 3 mm stone in the low right renal pelvis (65/3) and a 10 x 6 x 16 mm stone in the proximal right ureter adjacent to the stent on 72/3 and coronal 40/6. Nonobstructing stones again noted left kidney. No left ureteral stones. No bladder stones. Stomach/Bowel: Stomach is unremarkable. No gastric wall thickening. No evidence of outlet obstruction. Duodenum is normally positioned as is the ligament of Treitz. No small bowel wall thickening. No small bowel dilatation. The terminal ileum is normal. The appendix is not well visualized, but there is no edema or inflammation in the region of the cecal tip to suggest appendicitis. No gross colonic mass. No colonic wall thickening. Vascular/Lymphatic: There is moderate atherosclerotic calcification of the abdominal aorta without aneurysm. Upper normal retroperitoneal lymph nodes are evident in the right  para-aortic and retrocaval space. No pelvic sidewall lymphadenopathy. Reproductive: There is no adnexal mass. Other: No intraperitoneal free fluid. Musculoskeletal: No worrisome lytic or sclerotic osseous abnormality. IMPRESSION: 1. Marked right-sided hydronephrosis is similar to prior. The extrarenal fluid collections seen along the upper pole right kidney persist but are slightly smaller with dominant collection measuring 3.7 x 2.2 cm today compared to 4.9 x 2.6 cm previously. There is a small gas bubble associated with this dominant collection which may be related to the percutaneous nephrostomy tube that was visible on the previous study although superinfection of this collection could also have this appearance. 2. Double-J right internal ureteral stent device in place with 3 mm stone in the low right renal pelvis and a 10 x 6 x 16 mm stone in the proximal right ureter adjacent to the stent. 3. Multiple right-sided renal stones again noted including a dominant 14 mm interpolar stone and another dominant 10 mm stone in the lower pole region. 4. Nonobstructing stones left kidney. 5. Cholelithiasis. 6. Enlargement of the pulmonary outflow tract/main pulmonary arteries suggests pulmonary arterial hypertension. 7. Interval progression of collapse/consolidative disease in both lower lungs, right greater than left. Findings may reflect simple atelectasis although pneumonia is not excluded. 8. 2 mm left upper lobe pulmonary nodule. No follow-up needed if patient is low-risk.This recommendation follows the consensus statement: Guidelines for Management of Incidental Pulmonary Nodules Detected on CT Images: From the Fleischner Society 2017; Radiology 2017; 284:228-243. Electronically Signed   By: Donnal Fusi M.D.   On: 02/07/2024 06:08   DG Chest 2 View Result Date: 02/07/2024 CLINICAL DATA:  Chest pain EXAM: CHEST - 2 VIEW COMPARISON:  01/14/2024 and abdominal CT 01/26/2024 FINDINGS: Bands of opacity at the lung  bases. No edema, effusion,  or pneumothorax. Normal heart size. Artifact from EKG leads. IMPRESSION: Bands of atelectasis at the lung bases, increased from abdominal CT 01/26/2024, cannot exclude superimposed infection. Electronically Signed   By: Tiburcio Pea M.D.   On: 02/07/2024 04:37   DG C-Arm 1-60 Min-No Report Result Date: 02/03/2024 Fluoroscopy was utilized by the requesting physician.  No radiographic interpretation.   IR NEPHROSTOMY EXCHANGE RIGHT Result Date: 01/28/2024 INDICATION: 63 year old with obstructed right kidney secondary to stones. Concern for chronic infection. Percutaneous nephrostomy tube was placed on 01/15/2024. Follow-up imaging demonstrates persistent right hydronephrosis with new perinephric collections along the upper pole. Plan for nephrostogram through existing catheter with catheter exchange and manipulation. EXAM: 1. NEPHROSTOGRAM THROUGH EXISTING NEPHROSTOMY TUBE 2. NEPHROSTOMY TUBE EXCHANGE WITH FLUOROSCOPY Physician: Rachelle Hora. Lowella Dandy, MD COMPARISON:  CT abdomen and pelvis 01/26/2024 MEDICATIONS: 1% lidocaine for local anesthetic ANESTHESIA/SEDATION: None CONTRAST:  40mL OMNIPAQUE IOHEXOL 300 MG/ML SOLN - administered into the collecting system(s) FLUOROSCOPY: Radiation Exposure Index (as provided by the fluoroscopic device): 72 mGy Kerma COMPLICATIONS: None immediate. PROCEDURE: The procedure was explained to the patient. The risks and benefits of the procedure were discussed and the patient's questions were addressed. Informed consent was obtained from the patient. Patient was placed prone. The right nephrostomy tube and surrounding skin were prepped and draped in sterile fashion. Maximal barrier sterile technique was utilized including caps, mask, sterile gowns, sterile gloves, sterile drape, hand hygiene and skin antiseptic. Contrast was injected through the existing nephrostomy tube. Yellow purulent fluid was aspirated from the collecting system. Skin around the  nephrostomy tube was anesthetized using 1% lidocaine. Catheter was cut and removed over a Bentson wire. Kumpe catheter was manipulated into the upper pole calices and into the lower pole calices using a Bentson wire and superstiff Amplatz wire. Additional purulent fluid was aspirated. A new 12 [place] multipurpose drain was advanced over a wire into the renal collecting system and a large amount of thick purulent fluid was removed from both the upper and lower pole collecting systems. Collecting system was rigorously irrigated with sterile saline. Greater than 100 mL of purulent fluid was removed. 12 French drain was removed at one point in order to do more catheter manipulation within the collecting system. 12 French drain was replaced and the tip was placed near the renal pelvis extending into the lower pole collecting system. At one point, wire was able to be advanced down the right ureter. Catheter was sutured to skin and attached to a new gravity bag. Fluid was sent for culture. Fluoroscopic images were taken and saved for this procedure. FINDINGS: The nephrostogram demonstrated a very irregular collecting system related to complex purulent fluid within the collecting system. The initial nephrostogram demonstrated 2 areas of presumed contrast extravasation from the upper pole calices into the perinephric space. Suspect these are related to the new perinephric fluid collections. Greater than 100 mL purulent fluid was removed from the collecting system. Initially, the fluid was yellow in color. The residual fluid was bloody by the end of the procedure. Again noted are stones in the renal pelvis and proximal ureter. A wire did pass beyond the stones in the right ureter. IMPRESSION: 1. Greater than 100 mL of purulent fluid was removed from the right renal collecting system after catheter manipulation and rigorous irrigation. 2. Successful right nephrostomy tube exchange using fluoroscopy. Patient has a 12 [place]  multipurpose drain. 3. Bifid collecting system and the collecting systems are all communicating with each other. 4. Two areas are suspicious for  contrast extravasation into the perinephric space along the upper pole. This likely explains the new perinephric fluid collections. Electronically Signed   By: Richarda Overlie M.D.   On: 01/28/2024 15:29   CT ABDOMEN PELVIS W CONTRAST Result Date: 01/26/2024 CLINICAL DATA:  History of kidney stone. Concern for pyelonephritis. EXAM: CT ABDOMEN AND PELVIS WITH CONTRAST TECHNIQUE: Multidetector CT imaging of the abdomen and pelvis was performed using the standard protocol following bolus administration of intravenous contrast. RADIATION DOSE REDUCTION: This exam was performed according to the departmental dose-optimization program which includes automated exposure control, adjustment of the mA and/or kV according to patient size and/or use of iterative reconstruction technique. CONTRAST:  OMNIPAQUE IOHEXOL 300 MG/ML  SOLN COMPARISON:  CT abdomen pelvis dated 01/05/2024. FINDINGS: Lower chest: Bibasilar linear and patchy atelectasis. Pneumonia is not excluded. Small pericardial effusion measuring 4 mm in thickness. No intra-abdominal free air or free fluid. Hepatobiliary: The liver is unremarkable. No biliary dilatation. Layering stones in the gallbladder. No pericholecystic fluid or evidence of acute cholecystitis by CT. Pancreas: Unremarkable. No pancreatic ductal dilatation or surrounding inflammatory changes. Spleen: Normal in size without focal abnormality. Adrenals/Urinary Tract: The adrenal glands are unremarkable. Several nonobstructing left renal calculi measure up to 5 mm in the upper pole. No hydronephrosis on the left. Multiple stones in the proximal right ureter with the largest measuring approximately 12 mm in length. There has been interval placement of a percutaneous right nephrostomy with pigtail tip in the region of the ureteropelvic junction. Interval  progression of right hydronephrosis since the prior CT. Several loculated perinephric collections along the upper pole of the right kidney suspicious for urinomas in the setting of caliceal rupture and possible superimposed infection. The largest loculated collection measures approximately 3 x 5 cm. There is slight haziness of the right renal parenchyma and mild perinephric stranding suspicious for pyelonephritis. The urinary bladder is grossly unremarkable. Stomach/Bowel: There is moderate stool throughout the colon. There is no bowel obstruction or active inflammation. The appendix is not visualized with certainty. No inflammatory changes identified in the right lower quadrant. Vascular/Lymphatic: Moderate aortoiliac atherosclerotic disease. The IVC is unremarkable. No portal venous gas. There is no adenopathy. Reproductive: The uterus is retroverted and grossly unremarkable. No suspicious adnexal masses. Other: None Musculoskeletal: No acute or significant osseous findings. IMPRESSION: 1. Interval placement of a percutaneous right nephrostomy with pigtail tip in the region of the ureteropelvic junction. Interval progression of right hydronephrosis since the prior CT. 2. Findings of right pyelonephritis and multiple perinephric collections adjacent to the superior pole of the right kidney, likely urinomas or abscesses. 3. Nonobstructing left renal calculi. No hydronephrosis on the left. 4. Cholelithiasis. 5. No bowel obstruction. Electronically Signed   By: Elgie Collard M.D.   On: 01/26/2024 12:22   IR NEPHROSTOMY PLACEMENT RIGHT Result Date: 01/15/2024 CLINICAL DATA:  Obstructed right kidney secondary to central and proximal ureteral calculi. Suspected chronic obstruction with infection. EXAM: 1. ULTRASOUND GUIDANCE FOR PUNCTURE OF THE RIGHT RENAL COLLECTING SYSTEM. 2. RIGHT PERCUTANEOUS NEPHROSTOMY TUBE PLACEMENT. COMPARISON:  None Available. ANESTHESIA/SEDATION: Moderate (conscious) sedation was employed  during this procedure. A total of Versed 2.5 mg and Fentanyl 125 mcg was administered intravenously. Moderate Sedation Time: 25 minutes. The patient's level of consciousness and vital signs were monitored continuously by radiology nursing throughout the procedure under my direct supervision. CONTRAST:  12 mL Omnipaque 300 MEDICATIONS: A scheduled dose of 2 g of IV Rocephin was given just prior to the procedure. FLUOROSCOPY TIME:  50 seconds.  4.0 mGy. PROCEDURE: The procedure, risks, benefits, and alternatives were explained to the patient. Questions regarding the procedure were encouraged and answered. The patient understands and consents to the procedure. A time-out was performed prior to initiating the procedure. The left flank region was prepped with chlorhexidine in a sterile fashion, and a sterile drape was applied covering the operative field. A sterile gown and sterile gloves were used for the procedure. Local anesthesia was provided with 1% Lidocaine. Ultrasound was used to localize the right kidney. Under ultrasound image was saved and recorded. Under direct ultrasound guidance, an 18 gauge trocar needle was advanced into the right renal collecting system. Ultrasound image documentation was performed. Aspiration of urine sample was performed followed by contrast injection. Percutaneous tract dilatation was then performed over the guidewire. A 10-French percutaneous nephrostomy tube was then advanced and formed in the collecting system. Catheter position was confirmed by fluoroscopy after contrast injection. The catheter was secured at the skin with a Prolene retention suture and Stat-Lock device. The nephrostomy tube was attached to a gravity bag. COMPLICATIONS: None. FINDINGS: Ultrasound demonstrates massive hydronephrosis of the right kidney with cortical thinning. Aspiration at the level of the collecting system demonstrates grossly purulent urine return. Contrast injection demonstrates a partially  duplicated collecting system. The nephrostomy tube was formed at the level of the renal pelvis. There is return of purulent urine after tube placement. IMPRESSION: Right percutaneous nephrostomy tube placement. Grossly purulent urine return noted with a sample sent for culture analysis. A 10 French nephrostomy tube was placed and formed in the renal pelvis. This will be kept to gravity bag drainage. Electronically Signed   By: Irish Lack M.D.   On: 01/15/2024 15:51   DG Chest 2 View Result Date: 01/14/2024 CLINICAL DATA:  Upper abdominal pain. EXAM: CHEST - 2 VIEW COMPARISON:  Chest radiograph 05/21/2022. Included lung bases from abdominopelvic CT 01/05/2024 FINDINGS: The cardiomediastinal contours are normal. The lungs are clear. Pulmonary vasculature is normal. No consolidation, pleural effusion, or pneumothorax. No acute osseous abnormalities are seen. Left breast calcification. IMPRESSION: No active cardiopulmonary disease. Electronically Signed   By: Narda Rutherford M.D.   On: 01/14/2024 17:23    Microbiology: Results for orders placed or performed during the hospital encounter of 02/07/24  Blood Culture (routine x 2)     Status: None   Collection Time: 02/07/24  4:29 AM   Specimen: BLOOD  Result Value Ref Range Status   Specimen Description BLOOD BLOOD LEFT ARM  Final   Special Requests   Final    BOTTLES DRAWN AEROBIC AND ANAEROBIC Blood Culture results may not be optimal due to an inadequate volume of blood received in culture bottles   Culture   Final    NO GROWTH 5 DAYS Performed at [facility], [address], [place], [place] [address]    Report Status 02/12/2024 FINAL  Final  Blood Culture (routine x 2)     Status: None   Collection Time: 02/07/24  4:29 AM   Specimen: BLOOD  Result Value Ref Range Status   Specimen Description BLOOD BLOOD LEFT ARM  Final   Special Requests   Final    BOTTLES DRAWN AEROBIC AND ANAEROBIC Blood Culture adequate volume   Culture    Final    NO GROWTH 5 DAYS Performed at [facility], [address], [place], [place] [address]    Report Status 02/12/2024 FINAL  Final    Labs: CBC: Recent Labs  Lab 02/07/24 0255 02/08/24 0402 02/09/24 0223 02/10/24 0509  WBC 14.2* 17.2* 11.2* 8.2  NEUTROABS 10.6*  --  8.2* 5.0  HGB 8.8* 7.0* 8.5* 8.6*  HCT 27.4* 21.1* 25.7* 26.0*  MCV 84.3 81.2 82.6 84.1  PLT 660* 633* 628* 606*   Basic Metabolic Panel: Recent Labs  Lab 02/07/24 0410 02/08/24 0402 02/09/24 0223 02/10/24 0509  NA 133* 129* 133* 134*  K 4.3 4.4 4.3 3.9  CL 94* 94* 96* 99  CO2 26 25 26 25   GLUCOSE 121* 106* 107* 126*  BUN 18 12 14 14   CREATININE 1.06* 0.95 0.97 0.88  CALCIUM 10.1 9.1 9.6 9.4   Liver Function Tests: Recent Labs  Lab 02/07/24 0410 02/08/24 0402  AST 36 34  ALT 94* 64*  ALKPHOS 122 98  BILITOT 0.5 0.6  PROT 9.0* 6.9  ALBUMIN 2.8* 2.2*   CBG: No results for input(s): "GLUCAP" in the last 168 hours.  Discharge time spent: greater than 30 minutes.  This record has been created using Conservation officer, historic buildings. Errors have been sought and corrected,but may not always be located. Such creation errors do not reflect on the standard of care.   Signed: Luna Salinas, MD [facility] 02/12/2024

## 2024-02-17 ENCOUNTER — Encounter: Payer: Self-pay | Admitting: Infectious Diseases

## 2024-02-17 ENCOUNTER — Ambulatory Visit
Admission: RE | Admit: 2024-02-17 | Discharge: 2024-02-17 | Disposition: A | Discharge status: Home / Self Care | Payer: Self-pay | Source: Ambulatory Visit | Attending: Urology | Admitting: Urology

## 2024-02-17 ENCOUNTER — Ambulatory Visit: Payer: Self-pay | Attending: Infectious Diseases | Admitting: Infectious Diseases

## 2024-02-17 ENCOUNTER — Other Ambulatory Visit
Admission: RE | Admit: 2024-02-17 | Discharge: 2024-02-17 | Disposition: A | Discharge status: Home / Self Care | Payer: Self-pay | Source: Ambulatory Visit | Attending: Infectious Diseases | Admitting: *Deleted

## 2024-02-17 ENCOUNTER — Ambulatory Visit (INDEPENDENT_AMBULATORY_CARE_PROVIDER_SITE_OTHER): Payer: Self-pay | Admitting: Urology

## 2024-02-17 VITALS — BP 94/65 | HR 111 | Ht 64.5 in | Wt 124.0 lb

## 2024-02-17 VITALS — BP 100/67 | HR 120 | Temp 98.1°F | Ht 64.5 in | Wt 122.0 lb

## 2024-02-17 DIAGNOSIS — N139 Obstructive and reflux uropathy, unspecified: Secondary | ICD-10-CM | POA: Diagnosis not present

## 2024-02-17 DIAGNOSIS — N200 Calculus of kidney: Secondary | ICD-10-CM | POA: Insufficient documentation

## 2024-02-17 DIAGNOSIS — J181 Lobar pneumonia, unspecified organism: Secondary | ICD-10-CM

## 2024-02-17 DIAGNOSIS — B964 Proteus (mirabilis) (morganii) as the cause of diseases classified elsewhere: Secondary | ICD-10-CM | POA: Diagnosis not present

## 2024-02-17 DIAGNOSIS — D649 Anemia, unspecified: Secondary | ICD-10-CM | POA: Diagnosis not present

## 2024-02-17 DIAGNOSIS — N1330 Unspecified hydronephrosis: Secondary | ICD-10-CM

## 2024-02-17 DIAGNOSIS — N136 Pyonephrosis: Secondary | ICD-10-CM | POA: Insufficient documentation

## 2024-02-17 DIAGNOSIS — N12 Tubulo-interstitial nephritis, not specified as acute or chronic: Secondary | ICD-10-CM

## 2024-02-17 DIAGNOSIS — N390 Urinary tract infection, site not specified: Secondary | ICD-10-CM

## 2024-02-17 DIAGNOSIS — Z87442 Personal history of urinary calculi: Secondary | ICD-10-CM | POA: Diagnosis not present

## 2024-02-17 DIAGNOSIS — G8929 Other chronic pain: Secondary | ICD-10-CM | POA: Insufficient documentation

## 2024-02-17 DIAGNOSIS — A599 Trichomoniasis, unspecified: Secondary | ICD-10-CM | POA: Diagnosis not present

## 2024-02-17 LAB — COMPREHENSIVE METABOLIC PANEL WITH GFR
ALT: 26 U/L (ref 0–44)
AST: 17 U/L (ref 15–41)
Albumin: 3.3 g/dL — ABNORMAL LOW (ref 3.5–5.0)
Alkaline Phosphatase: 87 U/L (ref 38–126)
Anion gap: 12 (ref 5–15)
BUN: 26 mg/dL — ABNORMAL HIGH (ref 8–23)
CO2: 25 mmol/L (ref 22–32)
Calcium: 9.8 mg/dL (ref 8.9–10.3)
Chloride: 99 mmol/L (ref 98–111)
Creatinine, Ser: 1.02 mg/dL — ABNORMAL HIGH (ref 0.44–1.00)
GFR, Estimated: >60 mL/min (ref >60)
Glucose, Bld: 102 mg/dL — ABNORMAL HIGH (ref 70–99)
Potassium: 4.2 mmol/L (ref 3.5–5.1)
Sodium: 136 mmol/L (ref 135–145)
Total Bilirubin: 0.5 mg/dL (ref 0.0–1.2)
Total Protein: 7.9 g/dL (ref 6.5–8.1)

## 2024-02-17 LAB — CBC WITH DIFFERENTIAL/PLATELET
Abs Immature Granulocytes: 0.14 10*3/uL — ABNORMAL HIGH (ref 0.00–0.07)
Basophils Absolute: 0.1 10*3/uL (ref 0.0–0.1)
Basophils Relative: 1 %
Eosinophils Absolute: 0.2 10*3/uL (ref 0.0–0.5)
Eosinophils Relative: 3 %
HCT: 34 % — ABNORMAL LOW (ref 36.0–46.0)
Hemoglobin: 11 g/dL — ABNORMAL LOW (ref 12.0–15.0)
Immature Granulocytes: 2 %
Lymphocytes Relative: 32 %
Lymphs Abs: 2.3 10*3/uL (ref 0.7–4.0)
MCH: 27.6 pg (ref 26.0–34.0)
MCHC: 32.4 g/dL (ref 30.0–36.0)
MCV: 85.4 fL (ref 80.0–100.0)
Monocytes Absolute: 0.5 10*3/uL (ref 0.1–1.0)
Monocytes Relative: 7 %
Neutro Abs: 3.9 10*3/uL (ref 1.7–7.7)
Neutrophils Relative %: 55 %
Platelets: 552 10*3/uL — ABNORMAL HIGH (ref 150–400)
RBC: 3.98 MIL/uL (ref 3.87–5.11)
RDW: 16.2 % — ABNORMAL HIGH (ref 11.5–15.5)
WBC: 7 10*3/uL (ref 4.0–10.5)
nRBC: 0 % (ref 0.0–0.2)

## 2024-02-17 LAB — URINALYSIS, COMPLETE
Bilirubin, UA: NEGATIVE
Glucose, UA: NEGATIVE
Ketones, UA: NEGATIVE
Nitrite, UA: NEGATIVE
Specific Gravity, UA: >1.03 — ABNORMAL HIGH (ref 1.005–1.030)
Urobilinogen, Ur: 0.2 mg/dL (ref 0.2–1.0)
pH, UA: 5.5 (ref 5.0–7.5)

## 2024-02-17 LAB — MICROSCOPIC EXAMINATION: WBC, UA: >30 /HPF — AB (ref 0–5)

## 2024-02-17 MED ORDER — METRONIDAZOLE 500 MG PO TABS: 500.0000 mg | ORAL_TABLET | Freq: Two times a day (BID) | ORAL | 0 refills | Status: DC

## 2024-02-17 NOTE — Patient Instructions (Signed)
 Today, we discussed your ongoing kidney condition, including your chronic pain and urinary tract infection. We reviewed your current medications and planned for further evaluation and treatment.  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 You are scheduled for a consultation with Dr. [name] to discuss the potential need for nephrectomy (removal of the kidney) due to the severity of your condition.  -URINARY TRACT INFECTION: You have a persistent urinary tract infection related to your kidney condition. You will continue taking levofloxacin  500 mg once daily. We will monitor your liver and kidney function, and white blood cell count with blood tests. Please watch for any side effects of levofloxacin , such as joint pain, unusual fatigue, confusion, and diarrhea.  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 You will continue taking oxycodone  as needed for pain management. We will monitor your pain levels and adjust your pain management plan as necessary.  INSTRUCTIONS:  Please follow up with Dr. [name] for your urology consultation tContinue taking your medications as prescribed and monitor for any side effects. We will schedule blood tests to check your liver and kidney function, and white blood cell count. If you experience any new or worsening symptoms, please contact our office.

## 2024-02-17 NOTE — Progress Notes (Signed)
 NAME: Belinda Oneill  DOB: [birth year]  MRN: [id number]  Date/Time: 02/17/2024 11:56 AM   Subjective:   ?Patient is her with her sister in law She consented to the use of AI scribe Follow up after recent multiple hospitalizations for complicated UTI, xanthogranulomatous pyelonephritis  Belinda Oneill is a 63 y.o. with a history of renal calculi of more than 20 yrs  was recently in [facility] 01/14/24-01/20/24 for Rt flank pain and diagnosed with multiple large stones,  causing severe rt hydronephrosis for which she got a rt nephrostomy tube and was discharged on Po keflex  for proteus in urine culture presented  again on 3/31 with fever.  CT abdomen and pelvis showed progression of hydronephrosis with multiple perinephric collections in the superior pole likely urinomas or abscesses Satrted on IV antibiotics after blood culture and urine culture sent. On 4/2/underwent nephrostomy tube manipulation and exchange by IR , with plans to remove on 4/7 which the patient removed on 02/01/24 On 02/03/28 Urology performed cystoscopy and rt ureteral stent placement.tooth paste like material expressed from the rt ureter raising concern for XGP I had seen her as requested by urologist when she was in the hospital and switched her antibiotic cefadroxil  to levaquin  for 3 weeks She ws discharged on 411/25 only to return on 02/07/24 with severe rt flank pain and was later discharged on 4/17 Pt states the rt flank pain is persistent though better than before  The pain affects her movement and how she lays down. She is currently taking oxycodone  for pain management.  She is currently taking levofloxacin  500 mg once daily, oxycodone  for pain, Ferrex, oxybutynin , trazodone , naproxen  twice daily, and a laxative as needed for constipation. No issues with her medications.  She reports a lack of appetite and difficulty sleeping, sleeping no more than three hours at a time. She sometimes gets up for a snack during the night. Despite  taking pain medication, it does not help her sleep.  No joint or tendon pain, blood in urine, or difficulty passing urine. She reports occasional confusion, which she attributes to her oxycodone  use.  Past Medical History:  Diagnosis Date   Essential hypertension 01/14/2024   Kidney stones    Kidney stones     Past Surgical History:  Procedure Laterality Date   CYSTOSCOPY WITH STENT PLACEMENT Right 02/03/2024   Procedure: CYSTOSCOPY, WITH STENT INSERTION;  Surgeon: [name], MD;  Location: [facility] ORS;  Service: Urology;  Laterality: Right;   IR NEPHROSTOMY EXCHANGE RIGHT  01/28/2024   IR NEPHROSTOMY PLACEMENT RIGHT  01/15/2024   LITHOTRIPSY      Social History   Socioeconomic History   Marital status: Single    Spouse name: Not on file   Number of children: Not on file   Years of education: Not on file   Highest education level: Not on file  Occupational History   Not on file  Tobacco Use   Smoking status: Every Day    Current packs/day: 0.25    Average packs/day: 0.3 packs/day for 40.0 years (10.0 ttl pk-yrs)    Types: Cigarettes   Smokeless tobacco: Never  Substance and Sexual Activity   Alcohol use: Yes    Comment: occ   Drug use: No   Sexual activity: Not Currently  Other Topics Concern   Not on file  Social History Narrative   Not on file   Social Drivers of Health   Financial Resource Strain: Not on file  Food Insecurity: No Food Insecurity (02/09/2024)  Hunger Vital Sign    Worried About Programme researcher, broadcasting/film/video in the Last Year: Never true    Ran Out of Food in the Last Year: Never true  Transportation Needs: Unmet Transportation Needs (02/09/2024)   PRAPARE - Administrator, Civil Service (Medical): No    Lack of Transportation (Non-Medical): Yes  Physical Activity: Not on file  Stress: Not on file  Social Connections: Unknown (01/14/2024)   Social Connection and Isolation Panel [NHANES]    Frequency of Communication with Friends and Family: More  than three times a week    Frequency of Social Gatherings with Friends and Family: More than three times a week    Attends Religious Services: More than 4 times per year    Active Member of [employer] or Organizations: No    Attends Banker Meetings: Never    Marital Status: Not on file  Intimate Partner Violence: Not At Risk (02/09/2024)   Humiliation, Afraid, Rape, and Kick questionnaire    Fear of Current or Ex-Partner: No    Emotionally Abused: No    Physically Abused: No    Sexually Abused: No    Family History  Problem Relation Age of Onset   Breast cancer Maternal Aunt    No Known Allergies I? Current Outpatient Medications  Medication Sig Dispense Refill   acetaminophen  (TYLENOL ) 500 MG tablet Take 2 tablets (1,000 mg total) by mouth every 6 (six) hours as needed. 100 tablet 2   iron  polysaccharides (NIFEREX) 150 MG capsule Take 1 capsule (150 mg total) by mouth daily. 30 capsule 0   levofloxacin  (LEVAQUIN ) 750 MG tablet Take 1 tablet (750 mg total) by mouth daily for 14 days. 14 tablet 0   naproxen  (NAPROSYN ) 500 MG tablet Take 1 tablet (500 mg total) by mouth 2 (two) times daily with a meal. 60 tablet 0   oxybutynin  (DITROPAN ) 5 MG tablet Take 1 tablet (5 mg total) by mouth every 8 (eight) hours as needed for bladder spasms. 30 tablet 0   oxyCODONE -acetaminophen  (PERCOCET/ROXICET) 5-325 MG tablet Take 1 tablet by mouth every 8 (eight) hours as needed for moderate pain (pain score 4-6). 20 tablet 0   polyethylene glycol (MIRALAX  / GLYCOLAX ) 17 g packet Take 17 g by mouth daily as needed for mild constipation or moderate constipation. 14 each 0   senna-docusate (SENOKOT-S) 8.6-50 MG tablet Take 2 tablets by mouth 2 (two) times daily as needed for mild constipation. 100 tablet 0   tamsulosin  (FLOMAX ) 0.4 MG CAPS capsule Take 1 capsule (0.4 mg total) by mouth daily after supper.     traZODone  (DESYREL ) 50 MG tablet Take 1 tablet (50 mg total) by mouth at bedtime as needed  for up to 7 days for sleep. 7 tablet 0   No current facility-administered medications for this visit.     Abtx:  Anti-infectives (From admission, onward)    None       REVIEW OF SYSTEMS:  Const: negative fever, negative chills, negative weight loss Eyes: negative diplopia or visual changes, negative eye pain ENT: negative coryza, negative sore throat Resp: negative cough, hemoptysis, dyspnea Cards: negative for chest pain, palpitations, lower extremity edema GU: negative for frequency, dysuria and hematuria Severe pain rt flank GI: + abdominal pain, no diarrhea, bleeding, constipation takes laxatives, poor appetite Skin: negative for rash and pruritus Heme: negative for easy bruising and gum/nose bleeding MS:  weakness Neurolo:negative for headaches, dizziness, vertigo, memory problems  Psych:anxiety, depression  Endocrine:  negative for thyroid, diabetes Allergy/Immunology- negative for any medication or food allergies  Objective:  VITALS:  BP 100/67   Pulse (!) 120   Temp 98.1 F (36.7 C) (Temporal)   Ht 5' 4.5" (1.638 m)   Wt 122 lb (55.3 kg)   SpO2 97%   BMI 20.62 kg/m   PHYSICAL EXAM:  General: Alert, cooperative, no distress, appears stated age.   Eyes: Conjunctivae clear, anicteric sclerae. Pupils are equal ENT Nares normal. No drainage or sinus tenderness. Lips, mucosa, and tongue normal. No Thrush Neck: Supple, symmetrical, no adenopathy, thyroid: non tender no carotid bruit and no JVD. Back: rt cva fullness Lungs: Clear to auscultation bilaterally. No Wheezing or Rhonchi. No rales. Heart: Tachycardia Abdomen: Soft, non-tender,not distended. Bowel sounds normal. No masses Extremities: atraumatic, no cyanosis. No edema. No clubbing Skin: No rashes or lesions. Or bruising Lymph: Cervical, supraclavicular normal. Neurologic: Grossly non-focal Pertinent Labs Lab Results CBC    Component Value Date/Time   WBC 8.2 02/10/2024 0509   RBC 3.09 (L)  02/10/2024 0509   HGB 8.6 (L) 02/10/2024 0509   HGB 12.3 11/04/2013 0835   HCT 26.0 (L) 02/10/2024 0509   HCT 36.6 11/04/2013 0835   PLT 606 (H) 02/10/2024 0509   PLT 272 11/04/2013 0835   MCV 84.1 02/10/2024 0509   MCV 91 11/04/2013 0835   MCH 27.8 02/10/2024 0509   MCHC 33.1 02/10/2024 0509   RDW 14.8 02/10/2024 0509   RDW 13.7 11/04/2013 0835   LYMPHSABS 1.9 02/10/2024 0509   MONOABS 0.8 02/10/2024 0509   EOSABS 0.1 02/10/2024 0509   BASOSABS 0.0 02/10/2024 0509       Latest Ref Rng & Units 02/10/2024    5:09 AM 02/09/2024    2:23 AM 02/08/2024    4:02 AM  CMP  Glucose 70 - 99 mg/dL 161  096  045   BUN 8 - 23 mg/dL 14  14  12    Creatinine 0.44 - 1.00 mg/dL 4.09  8.11  9.14   Sodium 135 - 145 mmol/L 134  133  129   Potassium 3.5 - 5.1 mmol/L 3.9  4.3  4.4   Chloride 98 - 111 mmol/L 99  96  94   CO2 22 - 32 mmol/L 25  26  25    Calcium 8.9 - 10.3 mg/dL 9.4  9.6  9.1   Total Protein 6.5 - 8.1 g/dL   6.9   Total Bilirubin 0.0 - 1.2 mg/dL   0.6   Alkaline Phos 38 - 126 U/L   98   AST 15 - 41 U/L   34   ALT 0 - 44 U/L   64        ? 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 [name] to evaluate the necessity of nephrectomy due to the severity of her condition.    Urinary tract infection   She has a persistent urinary tract infection associated with xanthogranulomatous pyelonephritis. Currently on levofloxacin  . Blood cultures were last performed on February 10, 2024. The antibiotics aim to clear the infection but will not reduce kidney size or pain. Continue levofloxacin  500 mg once daily. Order blood tests to monitor liver and kidney  function,  and white blood cell count. Advise monitoring for levofloxacin  side effects, including joint pain, unusual fatigue, confusion, and diarrhea.  Chronic pain due to kidney condition   She experiences chronic right flank pain associated with xanthogranulomatous pyelonephritis, persistent despite oxycodone  use, affecting her sleep. She is on oxycodone  and naproxen  ? Anemia? _   ________________________________________________ Discussed with patient in great detail Will call her with lab results Follow up with me as needed and depending on recommendation from urology

## 2024-02-17 NOTE — Patient Instructions (Signed)
 Minimally Invasive Nephrectomy Minimally invasive nephrectomy is a surgery to remove a kidney. The body has two kidneys. They work as a filter to clean the blood and remove waste. The kidneys also remove extra fluid from the body by making pee (urine). This surgery may be done to: Remove the entire kidney, adrenal gland, and some structures around them. This is called a radical nephrectomy. Remove only the damaged or diseased part of the kidney. This is called a partial nephrectomy. You may need this surgery if: Your kidney is badly damaged from disease, infection, injury, or cancer. You were born with an abnormal kidney. You are donating a healthy kidney. Tell a health care provider about: Any allergies you have. All medicines you take. These include vitamins, herbs, eye drops, and creams. Any problems you or family members have had with anesthesia. Any bleeding problems you have. Any surgeries you've had. Any medical conditions you have. Whether you're pregnant or may be pregnant. What are the risks? Your health care provider will talk with you about risks. These may include: Bleeding. Infection. Damage to other body structures near the kidney. Pulmonary embolism. This is when a blood clot forms in the leg and moves to the lung. Allergic reactions to medicines. Kidney failure. Changing to an open procedure. This may happen if a bone, usually part of a rib, needs to be removed so the surgeon can get to the kidney. What happens before the surgery? When to stop eating and drinking Eat and drink only as you've been told. You may be told this: 8 hours before your surgery Stop eating most foods. Do not eat meat, fried foods, or fatty foods. Eat only light foods, such as toast or crackers. All liquids are okay except energy drinks and alcohol. 6 hours before your surgery Stop eating. Drink only clear liquids, such as water, clear fruit juice, black coffee, plain tea, and sports  drinks. Do not drink energy drinks or alcohol. 2 hours before your surgery Stop drinking all liquids. You may be allowed to take medicines with small sips of water. If you do not eat and drink as told, your surgery may be delayed or canceled. Medicines Ask about changing or stopping: Any medicines you take. Any vitamins, herbs, or supplements you take. Do not take aspirin or ibuprofen unless you're told to. Surgery safety For your safety, you may: Need to wash your skin with a soap that kills germs. Get antibiotics. Have your surgery site marked. Have hair removed at the surgery site. General instructions Do not smoke, vape, or use nicotine or tobacco for at least 4 weeks before the surgery. You might have your blood drawn and tested in case you need to receive blood, or get a transfusion, during surgery. What happens during the surgery?     An IV will be put into a vein in your hand or arm. You may be given: A sedative to help you relax. Anesthesia to keep you from feeling pain. A soft tube called a catheter will be placed in your bladder to drain pee. Several small cuts will be made in your belly. Special tools called trocars will be inserted through these cuts. Your belly will be filled with a gas. This gives the surgeon more room to work. A laparoscope will be put through one of the trocars. This is a thin, lighted tube with a camera on the end. The camera will send images to a screen in the operating room. Surgical tools will be put through  the other cuts. Sometimes, the surgeon may control these tools using a robot. This is called a robot-assisted nephrectomy. If you're having a partial nephrectomy: The blood vessels attached to your kidney will be clamped. Part of your kidney will be removed. The kidney will be closed with stitches or staples, and the clamp on the blood vessels will be removed. If you're having a radical nephrectomy: All of the blood vessels that attach  to your kidney will be tied off and separated from the kidney. Part of your ureter will be removed. The ureter is the tube that carries pee from your kidney to your bladder. A larger cut may be made in your lower belly to take out the kidney. A small tube (drain) may be placed near one of the cuts to drain extra fluid. The cuts will be closed with stitches, staples, skin glue, or tape strips. A bandage will be placed over the area. These steps may vary. Ask what you can expect. What happens after the surgery? You'll be watched closely until you leave. This includes checking your pain level, blood pressure, heart rate, and breathing rate. You may have: A urinary catheter. How much you pee will be checked. A drain. Your surgical drain will be removed after a few days. Pain medicine given through your IV. Compression stockings to reduce swelling and help prevent blood clots in your legs. You'll be told to: Get out of bed and walk as soon as you can. Do breathing exercises, like coughing and breathing deeply. This helps prevent pneumonia. Where to find more information American [employer] Society: cancer.org This information is not intended to replace advice given to you by your health care provider. Make sure you discuss any questions you have with your health care provider. Document Revised: 04/02/2023 Document Reviewed: 04/02/2023 Elsevier Patient Education  2024 [employer].

## 2024-02-17 NOTE — Progress Notes (Signed)
 Belinda Oneill,acting as a scribe for Belinda Gimenez, MD.,have documented all relevant documentation on the behalf of Belinda Gimenez, MD,as directed by  Belinda Gimenez, MD while in the presence of Belinda Gimenez, MD.  02/17/24 5:09 PM   Belinda Oneill 06/26/1961 [id number]  Referring provider: No referring provider defined for this encounter.  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 During her admission, she was found to have a minimally functioning right kidney and underwent nephrostomy tube placement, which was later replaced after dislodgement but ultimately not tolerated. She subsequently underwent ureteral stent placement.   Multiple chest CTs were performed in the setting of possible lung consolidation.   Her urine culture grew Proteus species, and Infectious Disease recommended four weeks of oral antibiotics (levofloxacin ) following IV antibiotics in the hospital.   She returns today to discuss definitive management of her right XGP kidney, which is planned for nephrectomy.   She reports ongoing symptoms and is concerned about her chronic infection and weight loss. She denies any prior abdominal surgeries.   She also has a history of left-sided kidney stones, described as small and yellow, but has not had significant issues from them. She expresses concern about the risks of surgery and recovery, but is motivated to proceed given her ongoing symptoms.   She was found to have trichomonas on urinalysis and is counseled regarding the need for treatment and partner notification. She is currently taking levofloxacin , with enough supply to last until the 29th of the month.    PMH: Past Medical History:  Diagnosis Date   Essential  hypertension 01/14/2024   Kidney stones    Kidney stones     Surgical History: Past Surgical History:  Procedure Laterality Date   CYSTOSCOPY WITH STENT PLACEMENT Right 02/03/2024   Procedure: CYSTOSCOPY, WITH STENT INSERTION;  Surgeon: Belinda Gimenez, MD;  Location: [facility] ORS;  Service: Urology;  Laterality: Right;   IR NEPHROSTOMY EXCHANGE RIGHT  01/28/2024   IR NEPHROSTOMY PLACEMENT RIGHT  01/15/2024   LITHOTRIPSY      Home Medications:  Allergies as of 02/17/2024   No Known Allergies      Medication List        Accurate as of February 17, 2024  5:09 PM. If you have any questions, ask your nurse or doctor.          STOP taking these medications    pantoprazole  40 MG tablet Commonly known as: Protonix  Stopped by: Alica Inks   Stimulant Laxative 8.6-50 MG tablet Generic drug: senna-docusate Stopped by: Belinda Oneill       TAKE these medications    acetaminophen  500 MG tablet Commonly known as: TYLENOL  Take 2 tablets (1,000 mg total) by mouth every 6 (six) hours as needed.   famotidine  20 MG tablet Commonly known as: PEPCID  Take 20 mg by mouth 2 (two) times daily. OTC What changed: Another medication with the same name was removed. Continue taking this medication, and follow the directions you see here. Changed by: Alica Inks   Ferrex 150 150 MG capsule Generic drug: iron  polysaccharides Take 1 capsule (150 mg total) by mouth daily.   levofloxacin  750 MG tablet Commonly known as: LEVAQUIN  Take 1 tablet (750 mg total) by mouth daily for 14 days.   metroNIDAZOLE  500  MG tablet Commonly known as: FLAGYL  Take 1 tablet (500 mg total) by mouth 2 (two) times daily. Started by: Belinda Oneill   naproxen  500 MG tablet Commonly known as: NAPROSYN  Take 1 tablet (500 mg total) by mouth 2 (two) times daily with a meal.   oxybutynin  5 MG tablet Commonly known as: DITROPAN  Take 1 tablet (5 mg total) by mouth every 8 (eight) hours as needed for  bladder spasms.   oxyCODONE -acetaminophen  5-325 MG tablet Commonly known as: PERCOCET/ROXICET Take 1 tablet by mouth every 8 (eight) hours as needed for moderate pain (pain score 4-6).   polyethylene glycol 17 g packet Commonly known as: MIRALAX  / GLYCOLAX  Take 17 g by mouth daily as needed for mild constipation or moderate constipation.   tamsulosin  0.4 MG Caps capsule Commonly known as: FLOMAX  Take 1 capsule (0.4 mg total) by mouth daily after supper.   traZODone  50 MG tablet Commonly known as: DESYREL  Take 1 tablet (50 mg total) by mouth at bedtime as needed for up to 7 days for sleep.        Family History: Family History  Problem Relation Age of Onset   Breast cancer Maternal Aunt     Social History:  reports that she has been smoking cigarettes. She has a 10 pack-year smoking history. She has never used smokeless tobacco. She reports current alcohol use. She reports that she does not use drugs.   Physical Exam: BP 94/65   Pulse (!) 111   Ht 5' 4.5" (1.638 m)   Wt 124 lb (56.2 kg)   BMI 20.96 kg/m   Constitutional:  Alert and oriented, No acute distress. HEENT: [place] AT, moist mucus membranes.  Trachea midline, no masses. Neurologic: Grossly intact, no focal deficits, moving all 4 extremities. Psychiatric: Normal mood and affect.   Assessment & Plan:    1. 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 She has persistent symptoms and minimal renal function on the right. Nephrostomy tube was not tolerated. Definitive management is planned with right nephrectomy, likely laparoscopic/minimally invasive, with open conversion if necessary. Risks, benefits, and alternatives discussed in detail with She, including infection, bleeding, injury to adjacent organs, hernia, ileus, and general surgical risks. - Preoperative workup to include repeat renal ultrasound (to assess for interval  development of abscess) and chest X-ray (to evaluate for lung consolidation). - Continue current antibiotics (Levaquin ) until planned surgery in May. - Office staff to coordinate and schedule surgery; Dr. Estanislao Heimlich will assist intraoperatively. - She was instructed to avoid NSAIDs post-nephrectomy and to maintain good hydration.  2. Urinary Tract Infection (UTI) with Proteus and Trichomonas vaginalis - Recent urine culture positive for Proteus; urinalysis with many bacteria, >30 RBCs, and trichomonas. - Infectious Disease recommended 4 weeks of oral antibiotics (post-IV therapy in hospital); She currently on Levaquin , to continue as prescribed. - New diagnosis of urinary trichomoniasis; prescribed Metronidazole  (Flagyl ) 500 mg PO BID x 7 days. - Urine culture to be repeated today. - She counseled on the sexually transmitted nature of trichomonas; advised to notify sexual partner(s) for evaluation and treatment to prevent reinfection. - Will reassess for resolution of infection prior to surgery.  3. Possible lung consolidation - Prior chest CT suggested possible consolidation; chest X-ray ordered today to reassess. - No current respiratory symptoms reported, but will review imaging and follow up as needed.  4. Left nephrolithiasis (small, non-obstructing stones) - History of small, non-obstructing left kidney stones, currently asymptomatic. -  No intervention planned at this time; She counseled on risk of obstruction post-nephrectomy and to report any left-sided flank pain immediately. - Will monitor and address if symptoms develop in the future.  Return for right nephrectomy.  I have reviewed the above documentation for accuracy and completeness, and I agree with the above.   Belinda Gimenez, MD    [facility] [address] [place], [place] [address] [phone]  I spent 40 total minutes on the day of the encounter including pre-visit review of  the medical record, face-to-face time with the patient, and post visit ordering of labs/imaging/tests.

## 2024-02-18 ENCOUNTER — Other Ambulatory Visit: Payer: Self-pay

## 2024-02-18 DIAGNOSIS — N118 Other chronic tubulo-interstitial nephritis: Secondary | ICD-10-CM

## 2024-02-18 DIAGNOSIS — N1330 Unspecified hydronephrosis: Secondary | ICD-10-CM

## 2024-02-18 DIAGNOSIS — N12 Tubulo-interstitial nephritis, not specified as acute or chronic: Secondary | ICD-10-CM

## 2024-02-18 NOTE — Progress Notes (Unsigned)
 Surgical Physician Order Form [facility]  Dr. [name], MD  * Scheduling expectation : Next Available  *Length of Case:   *Clearance needed: no  *Anticoagulation Instructions: Hold all anticoagulants  *Aspirin Instructions: Hold Aspirin  *Post-op visit Date/Instructions:  1 month follow up  *Diagnosis: Right XGP kidney, chronic hydronephrosis, chronic pyelonephritis  *Procedure: right HA lap simple nephrectomy   Additional orders: N/A  -Admit type: INpatient  -Anesthesia: General  -VTE Prophylaxis Standing Order SCD's       Other:   -Standing Lab Orders Per Anesthesia    Lab other: BMP/ CBC/ INR/ T&S  -Standing Test orders EKG/Chest x-ray per Anesthesia       Test other:   - Medications:  Ancef 2gm IV  -Other orders:  N/A

## 2024-02-19 ENCOUNTER — Telehealth: Payer: Self-pay

## 2024-02-19 LAB — CULTURE, URINE COMPREHENSIVE

## 2024-02-19 NOTE — Telephone Encounter (Signed)
 Per Dr. Ace Holder, MD Patient is to be scheduled for Right Hand Assisted Laparoscopic Radical Nephrectomy   Belinda Oneill was contacted and possible surgical dates were discussed, Monday June 2nd, 2025 was agreed upon for surgery.   Patient was directed to call [phone] between 1-3pm the day before surgery to find out surgical arrival time.  Instructions were given not to eat or drink from midnight on the night before surgery and have a driver for the day of surgery. On the surgery day patient was instructed to enter through the Medical Mall entrance of [facility] report the Same Day Surgery desk.   Pre-Admit Testing will be in contact via phone to set up an interview with the anesthesia team to review your history and medications prior to surgery.   Reminder of this information was sent via Mail to the patient.

## 2024-02-19 NOTE — Telephone Encounter (Signed)
 I attempted to reach the patient to discuss lab results. Patient did not answer.  LVM for patient to call back. Deejay Koppelman Adel Holt, CMA

## 2024-02-19 NOTE — Telephone Encounter (Signed)
 Patient informed of lab results, advised to stop Naprosyn  an to complete all antibiotics. Belinda Oneill, CMA

## 2024-02-19 NOTE — Telephone Encounter (Signed)
-----   Message from Alica Inks sent at 02/18/2024  9:03 AM EDT ----- Please let her know that creatinine mildly elevated at 1.06. The naprosyn  she takes twice a day can make it worse- She should stop that. Drink plenty of water . Continue antibiotic until she finishes the medication- It looks like  nephrectomy in May) . Date not scheduled by urologist. She saw them yesterday afternoon. thx ----- Message ----- From: Dannis Dy, Lab In [place] Sent: 02/17/2024   5:16 PM EDT To: Alica Inks, MD

## 2024-02-19 NOTE — Progress Notes (Signed)
   [facility] Surgical Posting Form  Surgery Date: Date: 03/29/2024  Surgeon: Dr. [name], MD  Inpt ( No  )   Outpt (Yes)   Obs ( No  )   Diagnosis: N11.8 Right Xanthogranulomatous Pyelonephritis, N13.30 Right Hydronephrosis, N12 Pyelonephritis  -CPT: 82956  Surgery: Right Hand Assisted Laparoscopic Radical Nephrectomy  Stop Anticoagulations: Yes and also hold ASA  Cardiac/Medical/Pulmonary Clearance needed: no  *Orders entered into EPIC  Date: 02/19/24   *Case booked in EPIC  Date: 02/18/2024  *Notified pt of Surgery: Date: 02/18/2024  PRE-OP UA & CX: Yes, will obtain with CBC, BMP, INR, Type and Screen   *Placed into Prior Authorization Work [place] Date: 02/19/24  Assistant/laser/rep:Yes, Dr. Estanislao Heimlich to assist

## 2024-02-24 ENCOUNTER — Telehealth: Payer: Self-pay

## 2024-02-24 DIAGNOSIS — N3289 Other specified disorders of bladder: Secondary | ICD-10-CM

## 2024-02-24 NOTE — Telephone Encounter (Signed)
 Patient advised. Patient had 2 more days on the medication and was advised to stop Levaquin  now, added to her intolerance list. Patient can not take Ibuprofen or Naproxed, she will continue with taking Tylenol  at this time.

## 2024-02-24 NOTE — Telephone Encounter (Signed)
 She has completed the course correct?  If she has not, the first thing I would do would be to stop taking the medication.  She can try over-the-counter pain reliever like ibuprofen or naproxen .  [name], MD

## 2024-02-24 NOTE — Telephone Encounter (Signed)
 Patient called left a message on triage line stating that she was asked by Dr Ace Holder if she was having any soreness/pain in joints or muscles since taking Levaquin  and she started experiencing this on Sunday 02/22/24 and would like to know what she should do at this time.

## 2024-02-24 NOTE — Addendum Note (Signed)
 Addended by: Natha Bair on: 02/24/2024 02:45 PM   Modules accepted: Orders

## 2024-02-26 ENCOUNTER — Ambulatory Visit
Admission: RE | Admit: 2024-02-26 | Discharge: 2024-02-26 | Disposition: A | Discharge status: Home / Self Care | Payer: Self-pay | Source: Ambulatory Visit | Attending: Urology | Admitting: Urology

## 2024-02-26 DIAGNOSIS — J181 Lobar pneumonia, unspecified organism: Secondary | ICD-10-CM

## 2024-02-26 DIAGNOSIS — N12 Tubulo-interstitial nephritis, not specified as acute or chronic: Secondary | ICD-10-CM

## 2024-02-26 DIAGNOSIS — N1330 Unspecified hydronephrosis: Secondary | ICD-10-CM

## 2024-02-26 DIAGNOSIS — A599 Trichomoniasis, unspecified: Secondary | ICD-10-CM

## 2024-03-01 ENCOUNTER — Telehealth: Payer: Self-pay

## 2024-03-01 NOTE — Telephone Encounter (Signed)
 I think the pain in her shoulder blade and arm probably is referred diaphragmatic irritation from her inflamed kidney.  If she starts having chest pain, shortness of breath or any other symptoms concerning for heart attack, she should go to the emergency room.  Yes she has to keep her ureteral stent until June.  It is keeping the pus from accumulating in her kidney.  We can give her some Gemtesa in addition to her oxybutynin .  Okay to refill Flomax .  Lastly, I do not prescribe sleep aids, it is outside the scope of my practice.  [name], MD

## 2024-03-01 NOTE — Telephone Encounter (Signed)
 1) Patient called back stating that she is still having right arm pain that she was having a week ago, not worse but staying the same, near shoulder blade and tip of shoulder area, Tylenol  does not help much and she is not able to take NSAIDs of any kind. Pain is constant and wakes her up at night if she lays on that arm. She wonders if this is still coming from taking Levaquin  and if there is anything else she can do or take?  2) Also would like to ask if she is to keep her ureteral stent until up coming surgery in June? She has been having lower abdominal cramping sensations that get intense at times. She takes Oxybutynin  when cramping gets severe and that helps but does not resolve it. Is there anything else she can try or should do?  3) Patient finished Tamsulosin , she needs another refill if she is to stay on the medication? Patient states in the hospital she was given Trazodone  to help her sleep and she is out of the medication and would like a refill. She does not have PCP. The reason she has trouble sleeping is due to the discomfort from the stent and pain from arm and been more aware and nervous about what she went through and upcoming surgery and trying to make anything worse for herself.

## 2024-03-01 NOTE — Telephone Encounter (Signed)
 Patient called on 5/3 and spoke with access nurse (fax in the media). Patient also called and left message on triage line asking for a call back in regards to her pain but the connection was breaking up on the voicemail and I could not understand everything that was been said.  I called left a message asking patient to call us  back to give us  details on what symptoms she is still having, when did they start, etc.

## 2024-03-02 MED ORDER — TAMSULOSIN HCL 0.4 MG PO CAPS: 0.4000 mg | ORAL_CAPSULE | Freq: Every day | ORAL | 2 refills | Status: DC

## 2024-03-02 MED ORDER — GEMTESA 75 MG PO TABS: 75.0000 mg | ORAL_TABLET | Freq: Every day | ORAL | 0 refills | Status: DC

## 2024-03-02 NOTE — Addendum Note (Signed)
 Addended by: Natha Bair on: 03/02/2024 01:27 PM   Modules accepted: Orders

## 2024-03-02 NOTE — Telephone Encounter (Signed)
 Patient advised. She will come by and get samples, Flomax  refilled.  She would like to have RX for pain medication to help settle this pain down if possible. Please send to [facility] pharmacy.

## 2024-03-03 MED ORDER — OXYCODONE-ACETAMINOPHEN 5-325 MG PO TABS: 1.0000 | ORAL_TABLET | Freq: Three times a day (TID) | ORAL | 0 refills | Status: DC | PRN

## 2024-03-03 NOTE — Addendum Note (Signed)
 Addended by: [name] on: 03/03/2024 03:57 PM   Modules accepted: Orders

## 2024-03-22 ENCOUNTER — Inpatient Hospital Stay: Admission: RE | Admit: 2024-03-22 | Payer: Self-pay | Source: Ambulatory Visit

## 2024-03-23 ENCOUNTER — Encounter
Admission: RE | Admit: 2024-03-23 | Discharge: 2024-03-23 | Disposition: A | Discharge status: Home / Self Care | Payer: Self-pay | Source: Ambulatory Visit | Attending: Urology | Admitting: Urology

## 2024-03-23 ENCOUNTER — Encounter: Payer: Self-pay | Admitting: Urology

## 2024-03-23 ENCOUNTER — Other Ambulatory Visit: Payer: Self-pay

## 2024-03-23 VITALS — Ht 64.0 in | Wt 124.0 lb

## 2024-03-23 DIAGNOSIS — R030 Elevated blood-pressure reading, without diagnosis of hypertension: Secondary | ICD-10-CM

## 2024-03-23 DIAGNOSIS — Z720 Tobacco use: Secondary | ICD-10-CM

## 2024-03-23 HISTORY — DX: Anemia, unspecified: D64.9

## 2024-03-23 HISTORY — DX: Gastro-esophageal reflux disease without esophagitis: K21.9

## 2024-03-23 NOTE — Patient Instructions (Addendum)
 Your procedure is scheduled on: Monday 03/29/24 To find out your arrival time, please call [phone] between 1PM - 3PM on:  Friday 03/26/24  Report to the Registration Desk on the 1st floor of the Medical Mall. Free Valet parking is available.  If your arrival time is 6:00 am, do not arrive before that time as the Medical Mall entrance doors do not open until 6:00 am.  REMEMBER: Instructions that are not followed completely may result in serious medical risk, up to and including death; or upon the discretion of your surgeon and anesthesiologist your surgery may need to be rescheduled.  Do not eat food after midnight the night before surgery.  No gum chewing or hard candies.  You may however, drink CLEAR liquids up to 2 hours before you are scheduled to arrive for your surgery. Do not drink anything within 2 hours of your scheduled arrival time.  Clear liquids include: - water  - apple juice without pulp - gatorade (not RED colors) - black coffee or tea (Do NOT add milk or creamers to the coffee or tea) Do NOT drink anything that is not on this list.  Type 1 and Type 2 diabetics should only drink water.  In addition, your doctor has ordered for you to drink the provided:  Ensure Pre-Surgery Clear Carbohydrate Drink  Gatorade G2 Drinking this carbohydrate drink up to two hours before surgery helps to reduce insulin resistance and improve patient outcomes. Please complete drinking 2 hours before scheduled arrival time.  One week prior to surgery: Stop Anti-inflammatories (NSAIDS) such as Advil, Aleve , Ibuprofen, Motrin, Naproxen , Naprosyn  and Aspirin based products such as Excedrin, Goody's Powder, BC Powder. You may however, continue to take Tylenol  if needed for pain up until the day of surgery.  Stop ANY OVER THE COUNTER supplements until after surgery.  Continue taking all prescribed medications with the exception of the following:  **Follow guidelines for insulin and diabetes  medications**  Follow recommendations from Cardiologist or PCP regarding stopping blood thinners.  TAKE ONLY THESE MEDICATIONS THE MORNING OF SURGERY WITH A SIP OF WATER:    Antacid (take one the night before and one on the morning of surgery - helps to prevent nausea after surgery.)  Use inhalers on the day of surgery and bring to the hospital.  Fleets enema or bowel prep as directed.  No Alcohol for 24 hours before or after surgery.  No Smoking including e-cigarettes for 24 hours before surgery.  No chewable tobacco products for at least 6 hours before surgery.  No nicotine  patches on the day of surgery.  Do not use any "recreational" drugs for at least a week (preferably 2 weeks) before your surgery.  Please be advised that the combination of cocaine and anesthesia may have negative outcomes, up to and including death. If you test positive for cocaine, your surgery will be cancelled.  On the morning of surgery brush your teeth with toothpaste and water, you may rinse your mouth with mouthwash if you wish. Do not swallow any toothpaste or mouthwash.  Use CHG Soap or wipes as directed on instruction sheet.  Do not wear lotions, powders, or perfumes.   Do not shave body hair from the neck down 48 hours before surgery.  Wear comfortable clothing (specific to your surgery type) to the hospital.  Do not wear jewelry, make-up, hairpins, clips or nail polish.  For welded (permanent) jewelry: bracelets, anklets, waist bands, etc.  Please have this removed prior to surgery.  If it is not removed, there is a chance that hospital personnel will need to cut it off on the day of surgery. Contact lenses, hearing aids and dentures may not be worn into surgery.  Bring your C-PAP to the hospital in case you may have to spend the night.   Do not bring valuables to the hospital. [facility] is not responsible for any missing/lost belongings or valuables.   Total Shoulder Arthroplasty:  use  Benzoyl Peroxide 5% Gel as directed on instruction sheet.  Notify your doctor if there is any change in your medical condition (cold, fever, infection).  If you are being discharged the day of surgery, you will not be allowed to drive home. You will need a responsible individual to drive you home and stay with you for 24 hours after surgery.   If you are taking public transportation, you will need to have a responsible individual with you.  If you are being admitted to the hospital overnight, leave your suitcase in the car. After surgery it may be brought to your room.  In case of increased patient census, it may be necessary for you, the patient, to continue your postoperative care in the Same Day Surgery department.  After surgery, you can help prevent lung complications by doing breathing exercises.  Take deep breaths and cough every 1-2 hours. Your doctor may order a device called an Incentive Spirometer to help you take deep breaths. When coughing or sneezing, hold a pillow firmly against your incision with both hands. This is called "splinting." Doing this helps protect your incision. It also decreases belly discomfort.  Surgery Visitation Policy:  Patients undergoing a surgery or procedure may have two family members or support persons with them as long as the person is not COVID-19 positive or experiencing its symptoms.   Inpatient Visitation:    Visiting hours are 7 a.m. to 8 p.m. Up to four visitors are allowed at one time in a patient room. The visitors may rotate out with other people during the day. One designated support person (adult) may remain overnight.  Please call the Pre-admissions Testing Dept. at [phone] if you have any questions about these instructions.

## 2024-03-25 ENCOUNTER — Encounter
Admission: RE | Admit: 2024-03-25 | Discharge: 2024-03-25 | Disposition: A | Discharge status: Home / Self Care | Payer: Self-pay | Source: Ambulatory Visit | Attending: Urology | Admitting: Urology

## 2024-03-25 ENCOUNTER — Encounter: Payer: Self-pay | Admitting: Urgent Care

## 2024-03-25 ENCOUNTER — Ambulatory Visit: Payer: Self-pay | Admitting: Urology

## 2024-03-25 DIAGNOSIS — N1330 Unspecified hydronephrosis: Secondary | ICD-10-CM

## 2024-03-25 DIAGNOSIS — N118 Other chronic tubulo-interstitial nephritis: Secondary | ICD-10-CM | POA: Insufficient documentation

## 2024-03-25 DIAGNOSIS — N130 Hydronephrosis with ureteropelvic junction obstruction: Secondary | ICD-10-CM | POA: Diagnosis not present

## 2024-03-25 DIAGNOSIS — R030 Elevated blood-pressure reading, without diagnosis of hypertension: Secondary | ICD-10-CM | POA: Insufficient documentation

## 2024-03-25 DIAGNOSIS — Z720 Tobacco use: Secondary | ICD-10-CM

## 2024-03-25 DIAGNOSIS — Z01818 Encounter for other preprocedural examination: Secondary | ICD-10-CM | POA: Diagnosis present

## 2024-03-25 DIAGNOSIS — N12 Tubulo-interstitial nephritis, not specified as acute or chronic: Secondary | ICD-10-CM | POA: Insufficient documentation

## 2024-03-25 LAB — CBC
HCT: 40.5 % (ref 36.0–46.0)
Hemoglobin: 12.9 g/dL (ref 12.0–15.0)
MCH: 27.7 pg (ref 26.0–34.0)
MCHC: 31.9 g/dL (ref 30.0–36.0)
MCV: 87.1 fL (ref 80.0–100.0)
Platelets: 371 10*3/uL (ref 150–400)
RBC: 4.65 MIL/uL (ref 3.87–5.11)
RDW: 19.4 % — ABNORMAL HIGH (ref 11.5–15.5)
WBC: 6 10*3/uL (ref 4.0–10.5)
nRBC: 0 % (ref 0.0–0.2)

## 2024-03-25 LAB — PROTIME-INR
INR: 1 (ref 0.8–1.2)
Prothrombin Time: 13.1 s (ref 11.4–15.2)

## 2024-03-25 LAB — URINALYSIS, COMPLETE (UACMP) WITH MICROSCOPIC
Bacteria, UA: NONE SEEN
Bilirubin Urine: NEGATIVE
Glucose, UA: NEGATIVE mg/dL
Ketones, ur: NEGATIVE mg/dL
Nitrite: NEGATIVE
Protein, ur: NEGATIVE mg/dL
Specific Gravity, Urine: 1.016 (ref 1.005–1.030)
WBC, UA: >50 WBC/hpf (ref 0–5)
pH: 5 (ref 5.0–8.0)

## 2024-03-25 LAB — TYPE AND SCREEN
ABO/RH(D): O NEG
Antibody Screen: NEGATIVE
Extend sample reason: TRANSFUSED

## 2024-03-25 LAB — BASIC METABOLIC PANEL WITH GFR
Anion gap: 8 (ref 5–15)
BUN: 17 mg/dL (ref 8–23)
CO2: 27 mmol/L (ref 22–32)
Calcium: 10.2 mg/dL (ref 8.9–10.3)
Chloride: 102 mmol/L (ref 98–111)
Creatinine, Ser: 0.69 mg/dL (ref 0.44–1.00)
GFR, Estimated: >60 mL/min (ref >60)
Glucose, Bld: 90 mg/dL (ref 70–99)
Potassium: 4.1 mmol/L (ref 3.5–5.1)
Sodium: 137 mmol/L (ref 135–145)

## 2024-03-25 MED ORDER — LEVOFLOXACIN 750 MG PO TABS: 750.0000 mg | ORAL_TABLET | Freq: Every day | ORAL | 0 refills | Status: DC

## 2024-03-25 NOTE — Telephone Encounter (Signed)
-----   Message from [name] sent at 03/25/2024  3:14 PM EDT ----- This urine culture today so results will be back in time for surgery.  Please go ahead and start Levaquin  750 mg daily dispense number 20 tablets as a precaution because I do not want her surgery to be canceled for any reason.  This will also help to decrease her bacterial load.  [name], MD

## 2024-03-25 NOTE — Telephone Encounter (Signed)
 Spoke with Patient. Patient advised that we will start on short course of Levaquin  due to its efficacy with previous bacteria. Patient with Levaquin  allergy, spoke with Dr. Ace Holder, approved since it is a short course patient should do well while on it.

## 2024-03-26 LAB — URINE CULTURE: Culture: <10000 — AB

## 2024-03-29 ENCOUNTER — Encounter
Admission: RE | Disposition: A | Discharge status: Home / Self Care | Payer: Self-pay | Source: Home / Self Care | Attending: Urology

## 2024-03-29 ENCOUNTER — Inpatient Hospital Stay

## 2024-03-29 ENCOUNTER — Inpatient Hospital Stay
Admission: RE | Admit: 2024-03-29 | Discharge: 2024-04-05 | DRG: 660 | Disposition: A | Discharge status: Home / Self Care | Payer: Self-pay | Attending: Urology | Admitting: Urology

## 2024-03-29 ENCOUNTER — Inpatient Hospital Stay: Payer: Self-pay | Admitting: Urgent Care

## 2024-03-29 ENCOUNTER — Other Ambulatory Visit: Payer: Self-pay

## 2024-03-29 ENCOUNTER — Encounter: Payer: Self-pay | Admitting: Urology

## 2024-03-29 DIAGNOSIS — B964 Proteus (mirabilis) (morganii) as the cause of diseases classified elsewhere: Secondary | ICD-10-CM | POA: Diagnosis present

## 2024-03-29 DIAGNOSIS — A5901 Trichomonal vulvovaginitis: Secondary | ICD-10-CM | POA: Diagnosis present

## 2024-03-29 DIAGNOSIS — R031 Nonspecific low blood-pressure reading: Secondary | ICD-10-CM | POA: Diagnosis not present

## 2024-03-29 DIAGNOSIS — N12 Tubulo-interstitial nephritis, not specified as acute or chronic: Secondary | ICD-10-CM | POA: Diagnosis present

## 2024-03-29 DIAGNOSIS — R0682 Tachypnea, not elsewhere classified: Secondary | ICD-10-CM | POA: Diagnosis not present

## 2024-03-29 DIAGNOSIS — N118 Other chronic tubulo-interstitial nephritis: Secondary | ICD-10-CM | POA: Diagnosis present

## 2024-03-29 DIAGNOSIS — Z881 Allergy status to other antibiotic agents status: Secondary | ICD-10-CM | POA: Diagnosis not present

## 2024-03-29 DIAGNOSIS — N1330 Unspecified hydronephrosis: Secondary | ICD-10-CM

## 2024-03-29 DIAGNOSIS — Z79899 Other long term (current) drug therapy: Secondary | ICD-10-CM | POA: Diagnosis not present

## 2024-03-29 DIAGNOSIS — F17210 Nicotine dependence, cigarettes, uncomplicated: Secondary | ICD-10-CM | POA: Diagnosis present

## 2024-03-29 DIAGNOSIS — Z87442 Personal history of urinary calculi: Secondary | ICD-10-CM

## 2024-03-29 DIAGNOSIS — I10 Essential (primary) hypertension: Secondary | ICD-10-CM | POA: Diagnosis present

## 2024-03-29 DIAGNOSIS — R000 Tachycardia, unspecified: Secondary | ICD-10-CM | POA: Diagnosis not present

## 2024-03-29 DIAGNOSIS — N119 Chronic tubulo-interstitial nephritis, unspecified: Secondary | ICD-10-CM | POA: Diagnosis present

## 2024-03-29 DIAGNOSIS — D72829 Elevated white blood cell count, unspecified: Secondary | ICD-10-CM | POA: Diagnosis not present

## 2024-03-29 DIAGNOSIS — Z803 Family history of malignant neoplasm of breast: Secondary | ICD-10-CM

## 2024-03-29 DIAGNOSIS — N200 Calculus of kidney: Secondary | ICD-10-CM | POA: Diagnosis present

## 2024-03-29 DIAGNOSIS — J439 Emphysema, unspecified: Secondary | ICD-10-CM | POA: Diagnosis present

## 2024-03-29 DIAGNOSIS — J9811 Atelectasis: Secondary | ICD-10-CM | POA: Diagnosis not present

## 2024-03-29 DIAGNOSIS — R52 Pain, unspecified: Secondary | ICD-10-CM | POA: Diagnosis not present

## 2024-03-29 HISTORY — DX: Atherosclerosis of aorta: I70.0

## 2024-03-29 HISTORY — PX: LAPAROSCOPIC NEPHRECTOMY: SHX1930

## 2024-03-29 HISTORY — DX: Calculus of gallbladder without cholecystitis without obstruction: K80.20

## 2024-03-29 HISTORY — DX: Cardiomegaly: I51.7

## 2024-03-29 HISTORY — DX: Solitary pulmonary nodule: R91.1

## 2024-03-29 HISTORY — DX: Emphysema, unspecified: J43.9

## 2024-03-29 LAB — TYPE AND SCREEN
ABO/RH(D): O NEG
Antibody Screen: NEGATIVE

## 2024-03-29 SURGERY — NEPHRECTOMY, RADICAL, LAPAROSCOPIC, ADULT
Anesthesia: General | Laterality: Right

## 2024-03-29 MED ORDER — MORPHINE SULFATE (PF) 2 MG/ML IV SOLN
2.0000 mg | INTRAVENOUS | Status: DC | PRN
  Administered 2024-03-29 (×2): 4 mg via INTRAVENOUS
  Filled 2024-03-29 (×2): qty 2

## 2024-03-29 MED ORDER — CHLORHEXIDINE GLUCONATE 0.12 % MT SOLN
OROMUCOSAL | Status: AC
Start: 2024-03-29 — End: ?
  Filled 2024-03-29: qty 15

## 2024-03-29 MED ORDER — DIPHENHYDRAMINE HCL 12.5 MG/5ML PO ELIX
12.5000 mg | ORAL_SOLUTION | Freq: Four times a day (QID) | ORAL | Status: DC | PRN
  Administered 2024-04-04 – 2024-04-05 (×3): 12.5 mg via ORAL
  Filled 2024-03-29 (×6): qty 5

## 2024-03-29 MED ORDER — DEXMEDETOMIDINE HCL IN NACL 80 MCG/20ML IV SOLN
INTRAVENOUS | Status: AC
  Filled 2024-03-29: qty 20

## 2024-03-29 MED ORDER — ORAL CARE MOUTH RINSE: 15.0000 mL | Freq: Once | OROMUCOSAL | Status: AC

## 2024-03-29 MED ORDER — CHLORHEXIDINE GLUCONATE 4 % EX SOLN
60.0000 mL | Freq: Once | CUTANEOUS | Status: AC
  Administered 2024-03-29: 4 via TOPICAL

## 2024-03-29 MED ORDER — HEPARIN SODIUM (PORCINE) 5000 UNIT/ML IJ SOLN
5000.0000 [IU] | Freq: Three times a day (TID) | INTRAMUSCULAR | Status: DC
  Administered 2024-03-29 – 2024-04-05 (×20): 5000 [IU] via SUBCUTANEOUS
  Filled 2024-03-29 (×21): qty 1

## 2024-03-29 MED ORDER — LACTATED RINGERS IV SOLN: INTRAVENOUS | Status: DC

## 2024-03-29 MED ORDER — DROPERIDOL 2.5 MG/ML IJ SOLN: 0.6250 mg | Freq: Once | INTRAMUSCULAR | Status: DC | PRN

## 2024-03-29 MED ORDER — HYDROMORPHONE HCL 1 MG/ML IJ SOLN
INTRAMUSCULAR | Status: DC | PRN
  Administered 2024-03-29 (×2): .5 mg via INTRAVENOUS

## 2024-03-29 MED ORDER — ONDANSETRON HCL 4 MG/2ML IJ SOLN
INTRAMUSCULAR | Status: AC
  Filled 2024-03-29: qty 2

## 2024-03-29 MED ORDER — ORAL CARE MOUTH RINSE: 15.0000 mL | OROMUCOSAL | Status: DC | PRN

## 2024-03-29 MED ORDER — SODIUM CHLORIDE 0.9 % IV SOLN: INTRAVENOUS | Status: AC

## 2024-03-29 MED ORDER — HEMOSTATIC AGENTS (NO CHARGE) OPTIME
TOPICAL | Status: DC | PRN
  Administered 2024-03-29: 1 via TOPICAL

## 2024-03-29 MED ORDER — ROCURONIUM BROMIDE 10 MG/ML (PF) SYRINGE
PREFILLED_SYRINGE | INTRAVENOUS | Status: AC
  Filled 2024-03-29: qty 10

## 2024-03-29 MED ORDER — OXYCODONE HCL 5 MG/5ML PO SOLN: 5.0000 mg | Freq: Once | ORAL | Status: DC | PRN

## 2024-03-29 MED ORDER — ACETAMINOPHEN 10 MG/ML IV SOLN
INTRAVENOUS | Status: DC | PRN
  Administered 2024-03-29: 1000 mg via INTRAVENOUS

## 2024-03-29 MED ORDER — ONDANSETRON HCL 4 MG/2ML IJ SOLN: 4.0000 mg | INTRAMUSCULAR | Status: DC | PRN

## 2024-03-29 MED ORDER — ONDANSETRON HCL 4 MG/2ML IJ SOLN
INTRAMUSCULAR | Status: DC | PRN
  Administered 2024-03-29: 4 mg via INTRAVENOUS

## 2024-03-29 MED ORDER — KETAMINE HCL 50 MG/5ML IJ SOSY
PREFILLED_SYRINGE | INTRAMUSCULAR | Status: DC | PRN
Start: 2024-03-29 — End: 2024-03-29
  Administered 2024-03-29: 20 mg via INTRAVENOUS
  Administered 2024-03-29: 10 mg via INTRAVENOUS

## 2024-03-29 MED ORDER — ACETAMINOPHEN 10 MG/ML IV SOLN
INTRAVENOUS | Status: AC
  Filled 2024-03-29: qty 100

## 2024-03-29 MED ORDER — ZOLPIDEM TARTRATE 5 MG PO TABS
5.0000 mg | ORAL_TABLET | Freq: Every evening | ORAL | Status: DC | PRN
  Administered 2024-03-29 – 2024-04-02 (×4): 5 mg via ORAL
  Filled 2024-03-29 (×4): qty 1

## 2024-03-29 MED ORDER — FENTANYL CITRATE (PF) 100 MCG/2ML IJ SOLN
INTRAMUSCULAR | Status: AC
  Filled 2024-03-29: qty 2

## 2024-03-29 MED ORDER — MIDAZOLAM HCL 2 MG/2ML IJ SOLN
INTRAMUSCULAR | Status: AC
  Filled 2024-03-29: qty 2

## 2024-03-29 MED ORDER — HYDROMORPHONE HCL 1 MG/ML IJ SOLN
INTRAMUSCULAR | Status: AC
  Filled 2024-03-29: qty 1

## 2024-03-29 MED ORDER — LIDOCAINE HCL (PF) 2 % IJ SOLN
INTRAMUSCULAR | Status: AC
Start: 2024-03-29 — End: ?
  Filled 2024-03-29: qty 5

## 2024-03-29 MED ORDER — OXYCODONE-ACETAMINOPHEN 5-325 MG PO TABS
1.0000 | ORAL_TABLET | ORAL | Status: DC | PRN
  Administered 2024-03-29 – 2024-04-01 (×8): 2 via ORAL
  Filled 2024-03-29 (×9): qty 2

## 2024-03-29 MED ORDER — CEFAZOLIN SODIUM-DEXTROSE 2-4 GM/100ML-% IV SOLN
2.0000 g | INTRAVENOUS | Status: AC
  Administered 2024-03-29: 2 g via INTRAVENOUS

## 2024-03-29 MED ORDER — MIDAZOLAM HCL 2 MG/2ML IJ SOLN
INTRAMUSCULAR | Status: DC | PRN
  Administered 2024-03-29: .5 mg via INTRAVENOUS
  Administered 2024-03-29: 1.5 mg via INTRAVENOUS

## 2024-03-29 MED ORDER — SUGAMMADEX SODIUM 200 MG/2ML IV SOLN
INTRAVENOUS | Status: DC | PRN
Start: 2024-03-29 — End: 2024-03-29
  Administered 2024-03-29: 112.4 mg via INTRAVENOUS

## 2024-03-29 MED ORDER — DEXMEDETOMIDINE HCL IN NACL 80 MCG/20ML IV SOLN
INTRAVENOUS | Status: DC | PRN
  Administered 2024-03-29: 8 ug via INTRAVENOUS
  Administered 2024-03-29: 12 ug via INTRAVENOUS
  Administered 2024-03-29 (×3): 4 ug via INTRAVENOUS
  Administered 2024-03-29: 8 ug via INTRAVENOUS

## 2024-03-29 MED ORDER — STERILE WATER FOR IRRIGATION IR SOLN
Status: DC | PRN
  Administered 2024-03-29: 500 mL

## 2024-03-29 MED ORDER — FENTANYL CITRATE (PF) 100 MCG/2ML IJ SOLN
INTRAMUSCULAR | Status: AC
Start: 2024-03-29 — End: ?
  Filled 2024-03-29: qty 2

## 2024-03-29 MED ORDER — HYDROMORPHONE HCL 1 MG/ML IJ SOLN
0.5000 mg | INTRAMUSCULAR | Status: DC | PRN
  Administered 2024-03-29 – 2024-04-01 (×18): 0.5 mg via INTRAVENOUS
  Filled 2024-03-29 (×18): qty 0.5

## 2024-03-29 MED ORDER — KETAMINE HCL 50 MG/5ML IJ SOSY
PREFILLED_SYRINGE | INTRAMUSCULAR | Status: AC
  Filled 2024-03-29: qty 5

## 2024-03-29 MED ORDER — ACETAMINOPHEN 325 MG PO TABS
650.0000 mg | ORAL_TABLET | ORAL | Status: DC | PRN
  Administered 2024-04-01 – 2024-04-03 (×3): 650 mg via ORAL
  Filled 2024-03-29 (×3): qty 2

## 2024-03-29 MED ORDER — OXYCODONE HCL 5 MG PO TABS: 5.0000 mg | ORAL_TABLET | Freq: Once | ORAL | Status: DC | PRN

## 2024-03-29 MED ORDER — BUPIVACAINE HCL (PF) 0.5 % IJ SOLN
INTRAMUSCULAR | Status: AC
  Filled 2024-03-29: qty 30

## 2024-03-29 MED ORDER — DEXAMETHASONE SODIUM PHOSPHATE 10 MG/ML IJ SOLN
INTRAMUSCULAR | Status: AC
Start: 2024-03-29 — End: ?
  Filled 2024-03-29: qty 1

## 2024-03-29 MED ORDER — CEFAZOLIN SODIUM-DEXTROSE 1-4 GM/50ML-% IV SOLN
1.0000 g | Freq: Three times a day (TID) | INTRAVENOUS | Status: AC
  Administered 2024-03-29 (×2): 1 g via INTRAVENOUS
  Filled 2024-03-29 (×3): qty 50

## 2024-03-29 MED ORDER — DOCUSATE SODIUM 100 MG PO CAPS
100.0000 mg | ORAL_CAPSULE | Freq: Two times a day (BID) | ORAL | Status: DC
  Administered 2024-03-29 – 2024-04-04 (×9): 100 mg via ORAL
  Filled 2024-03-29 (×13): qty 1

## 2024-03-29 MED ORDER — BUPIVACAINE LIPOSOME 1.3 % IJ SUSP
INTRAMUSCULAR | Status: AC
  Filled 2024-03-29: qty 20

## 2024-03-29 MED ORDER — PROPOFOL 10 MG/ML IV BOLUS
INTRAVENOUS | Status: DC | PRN
Start: 2024-03-29 — End: 2024-03-29
  Administered 2024-03-29: 140 mg via INTRAVENOUS

## 2024-03-29 MED ORDER — BUPIVACAINE LIPOSOME 1.3 % IJ SUSP
INTRAMUSCULAR | Status: DC | PRN
  Administered 2024-03-29: 50 mL

## 2024-03-29 MED ORDER — FENTANYL CITRATE (PF) 100 MCG/2ML IJ SOLN
25.0000 ug | INTRAMUSCULAR | Status: DC | PRN
  Administered 2024-03-29: 50 ug via INTRAVENOUS
  Administered 2024-03-29 (×2): 25 ug via INTRAVENOUS
  Administered 2024-03-29: 50 ug via INTRAVENOUS

## 2024-03-29 MED ORDER — STERILE WATER FOR IRRIGATION IR SOLN
Status: DC | PRN
  Administered 2024-03-29: 3000 mL

## 2024-03-29 MED ORDER — PROPOFOL 10 MG/ML IV BOLUS
INTRAVENOUS | Status: AC
Start: 2024-03-29 — End: ?
  Filled 2024-03-29: qty 40

## 2024-03-29 MED ORDER — PHENYLEPHRINE 80 MCG/ML (10ML) SYRINGE FOR IV PUSH (FOR BLOOD PRESSURE SUPPORT)
PREFILLED_SYRINGE | INTRAVENOUS | Status: DC | PRN
Start: 2024-03-29 — End: 2024-03-29
  Administered 2024-03-29: 120 ug via INTRAVENOUS
  Administered 2024-03-29: 80 ug via INTRAVENOUS

## 2024-03-29 MED ORDER — ACETAMINOPHEN 10 MG/ML IV SOLN: 1000.0000 mg | Freq: Once | INTRAVENOUS | Status: DC | PRN

## 2024-03-29 MED ORDER — DEXAMETHASONE SODIUM PHOSPHATE 10 MG/ML IJ SOLN
INTRAMUSCULAR | Status: DC | PRN
  Administered 2024-03-29: 10 mg via INTRAVENOUS

## 2024-03-29 MED ORDER — FENTANYL CITRATE (PF) 100 MCG/2ML IJ SOLN
INTRAMUSCULAR | Status: DC | PRN
  Administered 2024-03-29 (×2): 50 ug via INTRAVENOUS

## 2024-03-29 MED ORDER — DIPHENHYDRAMINE HCL 50 MG/ML IJ SOLN
12.5000 mg | Freq: Four times a day (QID) | INTRAMUSCULAR | Status: DC | PRN
  Administered 2024-04-05: 12.5 mg via INTRAVENOUS
  Filled 2024-03-29: qty 1

## 2024-03-29 MED ORDER — LIDOCAINE HCL (CARDIAC) PF 100 MG/5ML IV SOSY
PREFILLED_SYRINGE | INTRAVENOUS | Status: DC | PRN
Start: 2024-03-29 — End: 2024-03-29
  Administered 2024-03-29: 60 mg via INTRAVENOUS

## 2024-03-29 MED ORDER — ROCURONIUM BROMIDE 100 MG/10ML IV SOLN
INTRAVENOUS | Status: DC | PRN
Start: 2024-03-29 — End: 2024-03-29
  Administered 2024-03-29: 50 mg via INTRAVENOUS
  Administered 2024-03-29 (×2): 20 mg via INTRAVENOUS
  Administered 2024-03-29: 30 mg via INTRAVENOUS

## 2024-03-29 MED ORDER — PHENYLEPHRINE 80 MCG/ML (10ML) SYRINGE FOR IV PUSH (FOR BLOOD PRESSURE SUPPORT)
PREFILLED_SYRINGE | INTRAVENOUS | Status: AC
  Filled 2024-03-29: qty 10

## 2024-03-29 MED ORDER — SURGIFLO WITH THROMBIN (HEMOSTATIC MATRIX KIT) OPTIME
TOPICAL | Status: DC | PRN
  Administered 2024-03-29: 1 via TOPICAL

## 2024-03-29 MED ORDER — ROCURONIUM BROMIDE 10 MG/ML (PF) SYRINGE
PREFILLED_SYRINGE | INTRAVENOUS | Status: AC
Start: 2024-03-29 — End: ?
  Filled 2024-03-29: qty 10

## 2024-03-29 MED ORDER — GLYCOPYRROLATE 0.2 MG/ML IJ SOLN
INTRAMUSCULAR | Status: AC
  Filled 2024-03-29: qty 1

## 2024-03-29 MED ORDER — CEFAZOLIN SODIUM-DEXTROSE 2-4 GM/100ML-% IV SOLN
INTRAVENOUS | Status: AC
  Filled 2024-03-29: qty 100

## 2024-03-29 MED ORDER — OXYBUTYNIN CHLORIDE 5 MG PO TABS: 5.0000 mg | ORAL_TABLET | Freq: Three times a day (TID) | ORAL | Status: DC | PRN

## 2024-03-29 MED ORDER — CHLORHEXIDINE GLUCONATE 0.12 % MT SOLN
15.0000 mL | Freq: Once | OROMUCOSAL | Status: AC
  Administered 2024-03-29: 15 mL via OROMUCOSAL

## 2024-03-29 SURGICAL SUPPLY — 57 items
APPLICATOR SURGIFLO ENDO (HEMOSTASIS) IMPLANT
BAG LAPAROSCOPIC 12 15 PORT 16 (BASKET) ×1 IMPLANT
CHLORAPREP W/TINT 26 (MISCELLANEOUS) ×1 IMPLANT
CLEANER CAUTERY TIP PAD (MISCELLANEOUS) ×1 IMPLANT
CLIP APPLIE ROT 10 11.4 M/L (STAPLE) IMPLANT
CLIP LIGATING HEM O LOK PURPLE (MISCELLANEOUS) ×1 IMPLANT
DERMABOND ADVANCED .7 DNX12 (GAUZE/BANDAGES/DRESSINGS) ×2 IMPLANT
DRAPE INCISE IOBAN 66X45 STRL (DRAPES) ×1 IMPLANT
DRAPE STERI POUCH LG 24X46 STR (DRAPES) ×1 IMPLANT
DRAPE SURG 17X11 SM STRL (DRAPES) ×4 IMPLANT
DRSG TELFA 3X8 NADH STRL (GAUZE/BANDAGES/DRESSINGS) IMPLANT
ELECTRODE REM PT RTRN 9FT ADLT (ELECTROSURGICAL) ×1 IMPLANT
GLOVE BIO SURGEON STRL SZ 6.5 (GLOVE) ×2 IMPLANT
GLOVE BIOGEL PI IND STRL 7.5 (GLOVE) ×1 IMPLANT
GLOVE SURG UNDER LTX SZ6.5 (GLOVE) ×1 IMPLANT
GOWN STRL REUS W/ TWL LRG LVL3 (GOWN DISPOSABLE) ×2 IMPLANT
GOWN STRL REUS W/ TWL XL LVL3 (GOWN DISPOSABLE) ×1 IMPLANT
GRASPER SUT TROCAR 14GX15 (MISCELLANEOUS) ×1 IMPLANT
HANDLE YANKAUER SUCT BULB TIP (MISCELLANEOUS) ×1 IMPLANT
HEMOSTAT SURGICEL 2X14 (HEMOSTASIS) IMPLANT
HOLDER FOLEY CATH W/STRAP (MISCELLANEOUS) ×1 IMPLANT
IRRIGATION STRYKERFLOW (MISCELLANEOUS) ×1 IMPLANT
KIT PINK PAD W/HEAD ARE REST (MISCELLANEOUS) ×1 IMPLANT
KIT PINK PAD W/HEAD ARM REST (MISCELLANEOUS) ×1 IMPLANT
KIT TURNOVER KIT A (KITS) ×1 IMPLANT
KITTNER LAPARASCOPIC 5X40 (MISCELLANEOUS) ×1 IMPLANT
LABEL OR SOLS (LABEL) ×1 IMPLANT
LHOOK LAP DISP 36CM (ELECTROSURGICAL) ×1 IMPLANT
LIGASURE LAP ATLAS 10MM 37CM (INSTRUMENTS) ×1 IMPLANT
LOOP VESSEL MAXI 1X406 RED (MISCELLANEOUS) IMPLANT
MANIFOLD NEPTUNE II (INSTRUMENTS) ×1 IMPLANT
NDL HYPO 21X1.5 SAFETY (NEEDLE) ×1 IMPLANT
NEEDLE HYPO 21X1.5 SAFETY (NEEDLE) ×1 IMPLANT
PACK LAP CHOLECYSTECTOMY (MISCELLANEOUS) ×1 IMPLANT
RELOAD STAPLE 60 2.6 WHT THN (STAPLE) IMPLANT
RELOAD STAPLER WHITE 60MM (STAPLE) ×4 IMPLANT
SCISSORS METZENBAUM CVD 33 (INSTRUMENTS) ×1 IMPLANT
SET TUBE SMOKE EVAC HIGH FLOW (TUBING) ×1 IMPLANT
SLEEVE Z-THREAD 12X100MM (TROCAR) ×1 IMPLANT
SPONGE T-LAP 18X18 ~~LOC~~+RFID (SPONGE) ×1 IMPLANT
STAPLE ECHEON FLEX 60 POW ENDO (STAPLE) IMPLANT
STAPLER SKIN PROX 35W (STAPLE) ×1 IMPLANT
SURGIFLO W/THROMBIN 8M KIT (HEMOSTASIS) IMPLANT
SUT MNCRL AB 4-0 PS2 18 (SUTURE) ×2 IMPLANT
SUT PDS AB 1 CT1 36 (SUTURE) IMPLANT
SUT PDS AB 1 TP1 96 (SUTURE) ×1 IMPLANT
SUT VIC AB 0 CT1 36 (SUTURE) ×2 IMPLANT
SUT VIC AB 1 CT1 36 (SUTURE) IMPLANT
SUT VIC AB 2-0 SH 27XBRD (SUTURE) IMPLANT
SUT VICRYL 0 UR6 27IN ABS (SUTURE) ×1 IMPLANT
SYSTEM LAPSCP GELPORT 120MM (MISCELLANEOUS) ×1 IMPLANT
TRAP FLUID SMOKE EVACUATOR (MISCELLANEOUS) ×1 IMPLANT
TRAY FOLEY MTR SLVR 16FR STAT (SET/KITS/TRAYS/PACK) ×1 IMPLANT
TROCAR Z-THREAD FIOS 12X100MM (TROCAR) ×1 IMPLANT
TROCAR Z-THREAD FIOS 5X100MM (TROCAR) IMPLANT
WATER STERILE IRR 3000ML UROMA (IV SOLUTION) ×1 IMPLANT
WATER STERILE IRR 500ML POUR (IV SOLUTION) ×1 IMPLANT

## 2024-03-29 NOTE — Anesthesia Procedure Notes (Signed)
 Procedure Name: Intubation Date/Time: 03/29/2024 8:00 AM  Performed by: Clementine Cutting, CRNAPre-anesthesia Checklist: Patient identified, Patient being monitored, Timeout performed, Emergency Drugs available and Suction available Patient Re-evaluated:Patient Re-evaluated prior to induction Oxygen Delivery Method: Circle system utilized Preoxygenation: Pre-oxygenation with 100% oxygen Induction Type: IV induction Ventilation: Mask ventilation without difficulty Laryngoscope Size: 3 and McGrath Grade View: Grade I Tube type: Oral Tube size: 6.5 mm Number of attempts: 1 Airway Equipment and Method: Stylet Placement Confirmation: ETT inserted through vocal cords under direct vision, positive ETCO2 and breath sounds checked- equal and bilateral Secured at: 21 cm Tube secured with: Tape Dental Injury: Teeth and Oropharynx as per pre-operative assessment

## 2024-03-29 NOTE — Transfer of Care (Signed)
 Immediate Anesthesia Transfer of Care Note  Patient: Belinda Oneill  Procedure(s) Performed: NEPHRECTOMY, RADICAL, LAPAROSCOPIC, ADULT (Right)  Patient Location: PACU  Anesthesia Type:General  Level of Consciousness: awake and alert   Airway & Oxygen Therapy: Patient Spontanous Breathing and Patient connected to face mask oxygen  Post-op Assessment: Report given to RN and Post -op Vital signs reviewed and stable  Post vital signs: Reviewed and stable  Last Vitals:  Vitals Value Taken Time  BP 151/96 03/29/24 1026  Temp    Pulse 82 03/29/24 1028  Resp 10 03/29/24 1028  SpO2 100 % 03/29/24 1028  Vitals shown include unfiled device data.  Last Pain:  Vitals:   03/29/24 0626  PainSc: 3          Complications: No notable events documented.

## 2024-03-29 NOTE — Op Note (Signed)
 03/29/24  PREOP DIAGNOSIS: Chronic XGP kidney, right   POSTOPERATIVE DIAGNOSIS: Chronic XGP kidney, right  OPERATION PERFORMED: Hand-assisted laparoscopic right radical nephrectomy. Right ureteral stent removal  SURGEON: [name], MD  Assistant: Jay Meth, MD  ANESTHESIA: General.  ESTIMATED BLOOD LOSS: 50  cc  DRAINS: 16-French Foley catheter   COMPLICATIONS: None.  DESCRIPTION OF OPERATION: Informed consent was obtained. The patient was marked on the right side. IV antibiotics were given for bacterial prophylaxis on call to the operating room. SCDs were provided for DVT prophylaxis. The patient was taken to the operating room and placed supine on the operating table. General anesthesia was provided. A Foley catheter was placed to drain the bladder.    The patient was positioned in right lateral decubitus with the right flank elevated about 70 degrees and the table flexed slightly. The right arm was placed in a padded airplane for support.  An axillary roll was not utilized given that she was positioned in a slight position with no pressure on her axilla. The patient was secured to the table with soft straps and then prepped and draped sterilely.   We had a time-out confirming the patient identification, planned procedure, surgical site, and all present were in agreement. All present were in agreement.   A 8 cm incision was made for the hand port in the right lower quadrant. The anterior fascia was incised and elevated. The rectus belly was retracted medially and the peritoneum was incised. An incisional block was provided with liposomal Marcaine. The GelPort was assembled. A 12 mm trocar was placed above the umbilicus and then the abdomen was insufflated. Laparoscopic survey revealed no abnormalities or injuries. An additional 12 mm trocar was just below  the subxiphoid. All port sites were then infiltrated with liposomal Marcaine. Zero Vicryls  were placed at the 12 mm trocar sites with the Carter-Thomason device for closure at the end of the case.   The white line of Toldt was incised. The colon was reflected from the spleen to the pelvis. Gerota's fascia was lifted up off the lower pole of the kidney and the ureter was identified, somewhat medially deviated and dilated with a thick fibrotic rind around the ureter it had to be very carefully dissected away from the vena cava.  The duodenum was identified and kocherized medially.  The upper pole of the kidney was mobilized off of the quadratus lumborum and further mobilized away from the liver.  Notably, there was a dense area of fibrosis superior and medially including a thick rind representing the nephrostomy tube tract. 6 I did use an additional 5 mm port in the superior midline with a locking grasper to retract the liver superiorly.  The lower pole and lateral attachments were freed. The kidney was held laterally and the hilar dissection was completed such that the entire hilum could be encircled digitally en bloc.  At this point, the hilum was divided with a 60 mm vascular load staple using the battery operated endovascular stapler en bloc.  Another staple fire was used in the medial upper quadrant and remainder of the kidney was freed x 3.    I then turned my attention back to the ureter.  I incised the ureter laterally and used the hook to remove the stent.  I then dissected the ureter all the way down to the level of the iliacs where it was transected using a LigaSure.  The kidney was then passed through the GelPort off the field.  The  nephrectomy had was copiously irrigated.  The kidney was extracted through the gel port and passed off for pathological analysis.   Pneumoperitoneal pressure was reduced to 7 mmHg and the abdomen was inspected; hemostasis was confirmed. The 12 mm trocars were removed and the port sites closed with previously placed 0 Vicryl. The Gelport was  removed. The anterior fascia was closed with a running 1 PDS. Aaron Aas All the incisions were irrigated, patted dry, and then the skin was reapproximated with 4-0 Monocryl in a subcuticular fashion. The wounds were cleaned and dried and covered with Dermabond. All sponge, needle, and instrument counts were reported correct x2.   The patient was awakened from anesthesia and transferred to recovery in stable condition. There were no complications. The patient tolerated the procedure well.   An assistant was required for this surgical procedure. The duties of the assistant included but were not limited to suctioning, passing suture, camera manipulation, retraction. This procedure would not be able to be performed without an assistant.   ______________________________

## 2024-03-29 NOTE — Anesthesia Preprocedure Evaluation (Addendum)
 Anesthesia Evaluation  Patient identified by MRN, date of birth, ID band Patient awake    Reviewed: Allergy & Precautions, H&P , NPO status , Patient's Chart, lab work & pertinent test results  Airway Mallampati: II  TM Distance: >3 FB Neck ROM: full    Dental no notable dental hx.    Pulmonary Current Smoker and Patient abstained from smoking. Pulmonary emphysema   CT chest 4/25: IMPRESSION: 1. Continued consolidation with some volume loss in the right middle lobe, right lower lobe, left lower lobe, and to a lesser extent in the lingula. No substantial change from 02/07/2024. 2. Small right pleural effusion, increased from previous. 3. Complex perinephric process along the right kidney upper pole, cannot exclude abscesses. This is only partially characterized. Prominent right hydronephrosis. 4. Endplate sclerosis and mild endplate irregularity at the T8-9 level, likely degenerative in due to Schmorl's nodes, similar to 02/07/2024 but progressive from 04/02/2020. If the patient has symptoms suggestive of discitis than thoracic spine MRI with and without contrast would be recommended. 5. Mild cardiomegaly. Small pericardial effusion similar to prior. 6. Aortic Atherosclerosis (ICD10-I70.0) and Emphysema (ICD10-J43.9).    Pulmonary exam normal        Cardiovascular Exercise Tolerance: Poor hypertension, Normal cardiovascular exam  Cardiomegaly  Pt denies sob, angina. 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  Hemoglobin 8.6.  S/p transfusion of 1  unit of PRBCs on 02/08/2024 for hemoglobin of 7.   Anesthesia Other Findings Past Medical History: No date: Anemia No date: Aortic atherosclerosis (HCC) No date: Cardiomegaly No date: Cholelithiasis 01/14/2024: Essential hypertension No date: GERD (gastroesophageal reflux disease) No date: Kidney stones No date: Left upper lobe pulmonary nodule No date: Pulmonary emphysema [facility])  Past Surgical History: 02/03/2024: CYSTOSCOPY WITH STENT PLACEMENT; Right     Comment:  Procedure: CYSTOSCOPY, WITH STENT INSERTION;  Surgeon:               [name], MD;  Location: [facility] ORS;  Service:               Urology;  Laterality: Right; 01/28/2024: IR NEPHROSTOMY EXCHANGE RIGHT 01/15/2024: IR NEPHROSTOMY PLACEMENT RIGHT No date: LITHOTRIPSY     Reproductive/Obstetrics negative OB ROS                             Anesthesia Physical Anesthesia Plan  ASA: 3  Anesthesia Plan: General ETT   Post-op Pain Management: Ofirmev  IV (intra-op)* and Ketamine IV*   Induction: Intravenous  PONV Risk Score and Plan: 2 and Ondansetron , Dexamethasone  and Midazolam   Airway Management Planned: Oral ETT  Additional Equipment:   Intra-op Plan:   Post-operative Plan: Extubation in OR  Informed Consent: I have reviewed the patients History and Physical, chart, labs and discussed the procedure including the risks, benefits and alternatives for the proposed anesthesia with the patient or authorized representative who has indicated his/her understanding and acceptance.     Dental Advisory Given  Plan Discussed with: CRNA and Surgeon  Anesthesia Plan Comments: (Possible arterial line)  Anesthesia Quick Evaluation

## 2024-03-29 NOTE — H&P (Signed)
 03/29/24  RRR CTAB  Belinda Oneill 1961/09/08 [id number]   Referring provider: No referring provider defined for this encounter.      Chief Complaint  Patient presents with   Follow-up      HPI:   63 year old female with a personal history of chronic right kidney infection, consistent with xanthogranulomatous pyelonephritis (XGP) based on CT scan findings, presents today for follow-up after recent hospitalization. During her admission, she was found to have a minimally functioning right kidney and underwent nephrostomy tube placement, which was later replaced after dislodgement but ultimately not tolerated. She subsequently underwent ureteral stent placement.    Multiple chest CTs were performed in the setting of possible lung consolidation.    Her urine culture grew Proteus species, and Infectious Disease recommended four weeks of oral antibiotics (levofloxacin ) following IV antibiotics in the hospital.    She returns today to discuss definitive management of her right XGP kidney, which is planned for nephrectomy.    She reports ongoing symptoms and is concerned about her chronic infection and weight loss. She denies any prior abdominal surgeries.    She also has a history of left-sided kidney stones, described as small and yellow, but has not had significant issues from them. She expresses concern about the risks of surgery and recovery, but is motivated to proceed given her ongoing symptoms.    She was found to have trichomonas on urinalysis and is counseled regarding the need for treatment and partner notification. She is currently taking levofloxacin , with enough supply to last until the 29th of the month.      PMH:     Past Medical History:  Diagnosis Date   Essential hypertension 01/14/2024   Kidney stones     Kidney stones            Surgical History:      Past Surgical History:  Procedure Laterality Date   CYSTOSCOPY WITH STENT PLACEMENT Right 02/03/2024     Procedure: CYSTOSCOPY, WITH STENT INSERTION;  Surgeon: [name], MD;  Location: [facility] ORS;  Service: Urology;  Laterality: Right;   IR NEPHROSTOMY EXCHANGE RIGHT   01/28/2024   IR NEPHROSTOMY PLACEMENT RIGHT   01/15/2024   LITHOTRIPSY              Home Medications:  Allergies as of 02/17/2024   No Known Allergies         Medication List           Accurate as of February 17, 2024  5:09 PM. If you have any questions, ask your nurse or doctor.              STOP taking these medications     pantoprazole  40 MG tablet Commonly known as: Protonix  Stopped by: Alica Inks    Stimulant Laxative 8.6-50 MG tablet Generic drug: senna-docusate Stopped by: [name]           TAKE these medications     acetaminophen  500 MG tablet Commonly known as: TYLENOL  Take 2 tablets (1,000 mg total) by mouth every 6 (six) hours as needed.    famotidine  20 MG tablet Commonly known as: PEPCID  Take 20 mg by mouth 2 (two) times daily. OTC What changed: Another medication with the same name was removed. Continue taking this medication, and follow the directions you see here. Changed by: Alica Inks    Ferrex 150 150 MG capsule Generic drug: iron  polysaccharides Take 1 capsule (150 mg total) by mouth daily.  levofloxacin  750 MG tablet Commonly known as: LEVAQUIN  Take 1 tablet (750 mg total) by mouth daily for 14 days.    metroNIDAZOLE  500 MG tablet Commonly known as: FLAGYL  Take 1 tablet (500 mg total) by mouth 2 (two) times daily. Started by: [name]    naproxen  500 MG tablet Commonly known as: NAPROSYN  Take 1 tablet (500 mg total) by mouth 2 (two) times daily with a meal.    oxybutynin  5 MG tablet Commonly known as: DITROPAN  Take 1 tablet (5 mg total) by mouth every 8 (eight) hours as needed for bladder spasms.    oxyCODONE -acetaminophen  5-325 MG tablet Commonly known as: PERCOCET/ROXICET Take 1 tablet by mouth every 8 (eight) hours as needed  for moderate pain (pain score 4-6).    polyethylene glycol 17 g packet Commonly known as: MIRALAX  / GLYCOLAX  Take 17 g by mouth daily as needed for mild constipation or moderate constipation.    tamsulosin  0.4 MG Caps capsule Commonly known as: FLOMAX  Take 1 capsule (0.4 mg total) by mouth daily after supper.    traZODone  50 MG tablet Commonly known as: DESYREL  Take 1 tablet (50 mg total) by mouth at bedtime as needed for up to 7 days for sleep.             Family History:      Family History  Problem Relation Age of Onset   Breast cancer Maternal Aunt            Social History:  reports that she has been smoking cigarettes. She has a 10 pack-year smoking history. She has never used smokeless tobacco. She reports current alcohol use. She reports that she does not use drugs.     Physical Exam: BP 94/65   Pulse (!) 111   Ht 5' 4.5" (1.638 m)   Wt 124 lb (56.2 kg)   BMI 20.96 kg/m   Constitutional:  Alert and oriented, No acute distress. HEENT: [place] AT, moist mucus membranes.  Trachea midline, no masses. Neurologic: Grossly intact, no focal deficits, moving all 4 extremities. Psychiatric: Normal mood and affect.     Assessment & Plan:     1. 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 She has persistent symptoms and minimal renal function on the right. Nephrostomy tube was not tolerated. Definitive management is planned with right nephrectomy, likely laparoscopic/minimally invasive, with open conversion if necessary. Risks, benefits, and alternatives discussed in detail with She, including infection, bleeding, injury to adjacent organs, hernia, ileus, and general surgical risks. - Preoperative workup to include repeat renal ultrasound (to assess for interval development of abscess) and chest X-ray (to evaluate for lung consolidation). - Continue current antibiotics (Levaquin ) until  planned surgery in May. - Office staff to coordinate and schedule surgery; Dr. Estanislao Heimlich will assist intraoperatively. - She was instructed to avoid NSAIDs post-nephrectomy and to maintain good hydration.   2. Urinary Tract Infection (UTI) with Proteus and Trichomonas vaginalis - Recent urine culture positive for Proteus; urinalysis with many bacteria, >30 RBCs, and trichomonas. - Infectious Disease recommended 4 weeks of oral antibiotics (post-IV therapy in hospital); She currently on Levaquin , to continue as prescribed. - New diagnosis of urinary trichomoniasis; prescribed Metronidazole  (Flagyl ) 500 mg PO BID x 7 days. - Urine culture to be repeated today. - She counseled on the sexually transmitted nature of trichomonas; advised to notify sexual partner(s) for evaluation and treatment to prevent reinfection. - Will reassess for resolution of  infection prior to surgery.   3. Possible lung consolidation - Prior chest CT suggested possible consolidation; chest X-ray ordered today to reassess. - No current respiratory symptoms reported, but will review imaging and follow up as needed.   4. Left nephrolithiasis (small, non-obstructing stones) - History of small, non-obstructing left kidney stones, currently asymptomatic. - No intervention planned at this time; She counseled on risk of obstruction post-nephrectomy and to report any left-sided flank pain immediately. - Will monitor and address if symptoms develop in the future.   Return for right nephrectomy.   I have reviewed the above documentation for accuracy and completeness, and I agree with the above.    [name], MD       [facility] [address] [place], [place] [address] [phone]   I spent 40 total minutes on the day of the encounter including pre-visit review of the medical record, face-to-face time with the patient, and post visit ordering of labs/imaging/tests.

## 2024-03-29 NOTE — Plan of Care (Signed)

## 2024-03-30 ENCOUNTER — Encounter: Payer: Self-pay | Admitting: Urology

## 2024-03-30 LAB — CBC
HCT: 33.9 % — ABNORMAL LOW (ref 36.0–46.0)
Hemoglobin: 10.6 g/dL — ABNORMAL LOW (ref 12.0–15.0)
MCH: 27.6 pg (ref 26.0–34.0)
MCHC: 31.3 g/dL (ref 30.0–36.0)
MCV: 88.3 fL (ref 80.0–100.0)
Platelets: 293 10*3/uL (ref 150–400)
RBC: 3.84 MIL/uL — ABNORMAL LOW (ref 3.87–5.11)
RDW: 18.4 % — ABNORMAL HIGH (ref 11.5–15.5)
WBC: 8.8 10*3/uL (ref 4.0–10.5)
nRBC: 0 % (ref 0.0–0.2)

## 2024-03-30 LAB — BASIC METABOLIC PANEL WITH GFR
Anion gap: 9 (ref 5–15)
BUN: 13 mg/dL (ref 8–23)
CO2: 25 mmol/L (ref 22–32)
Calcium: 8.8 mg/dL — ABNORMAL LOW (ref 8.9–10.3)
Chloride: 103 mmol/L (ref 98–111)
Creatinine, Ser: 0.76 mg/dL (ref 0.44–1.00)
GFR, Estimated: >60 mL/min (ref >60)
Glucose, Bld: 105 mg/dL — ABNORMAL HIGH (ref 70–99)
Potassium: 4 mmol/L (ref 3.5–5.1)
Sodium: 137 mmol/L (ref 135–145)

## 2024-03-30 LAB — SURGICAL PATHOLOGY

## 2024-03-30 NOTE — TOC CM/SW Note (Signed)
 Transition of Care [facility]) - Inpatient Brief Assessment   Patient Details  Name: RONNA HERSKOWITZ MRN: [id number] Date of Birth: 20-Sep-1961  Transition of Care [facility]) CM/SW Contact:    Loman Risk, RN Phone Number: 03/30/2024, 11:57 AM   Clinical Narrative:    Transition of Care [facility]) Screening Note   Patient Details  Name: DALISA FORRER Date of Birth: [birth year]   Transition of Care [facility]) CM/SW Contact:    Loman Risk, RN Phone Number: 03/30/2024, 11:57 AM    Transition of Care Department [facility]) has reviewed patient and no TOC needs have been identified at this time.  If new patient transition needs arise, please place a TOC consult.  List of local PCP added to AVS  Transition of Care Asessment: Insurance and Status: Insurance coverage has been reviewed Patient has primary care physician: Yes     Prior/Current Home Services: No current home services Social Drivers of Health Review: SDOH reviewed no interventions necessary Readmission risk has been reviewed: Yes Transition of care needs: no transition of care needs at this time

## 2024-03-30 NOTE — Progress Notes (Signed)
 Urology Consult Follow Up  Subjective: POD # 1   She states that she has been having right sided pains 2 days prior to her operation.  She states that she is having a dry cough.  She also endorses that it is painful to take deep breaths.  She is tolerating clears.  She has not had flatus.  Good UOP with clear yellow urine.  Serum creatinine normal.  Hemoglobin/hematocrit stable.    Anti-infectives: Anti-infectives (From admission, onward)    Start     Dose/Rate Route Frequency Ordered Stop   03/29/24 1600  ceFAZolin (ANCEF) IVPB 1 g/50 mL premix        1 g 100 mL/hr over 30 Minutes Intravenous Every 8 hours 03/29/24 1224 03/29/24 2341   03/29/24 0612  ceFAZolin (ANCEF) IVPB 2g/100 mL premix        2 g 200 mL/hr over 30 Minutes Intravenous 30 min pre-op 03/29/24 0612 03/29/24 0830       Current Facility-Administered Medications  Medication Dose Route Frequency Provider Last Rate Last Admin   0.9 %  sodium chloride  infusion   Intravenous Continuous [name], MD 100 mL/hr at 03/30/24 0028 New Bag at 03/30/24 0028   acetaminophen  (TYLENOL ) tablet 650 mg  650 mg Oral Q4H PRN [name], MD       diphenhydrAMINE (BENADRYL) injection 12.5 mg  12.5 mg Intravenous Q6H PRN [name], MD       Or   diphenhydrAMINE (BENADRYL) 12.5 MG/5ML elixir 12.5 mg  12.5 mg Oral Q6H PRN [name], MD       docusate sodium  (COLACE) capsule 100 mg  100 mg Oral BID [name], MD   100 mg at 03/29/24 1452   heparin  injection 5,000 Units  5,000 Units Subcutaneous Q8H [name], MD   5,000 Units at 03/30/24 1610   HYDROmorphone  (DILAUDID ) injection 0.5 mg  0.5 mg Intravenous Q2H PRN [name], MD   0.5 mg at 03/30/24 9604   ondansetron  (ZOFRAN ) injection 4 mg  4 mg Intravenous Q4H PRN [name], MD       Oral care mouth rinse  15 mL Mouth Rinse PRN [name], MD       oxybutynin  (DITROPAN ) tablet 5 mg  5 mg Oral Q8H PRN [name], MD        oxyCODONE -acetaminophen  (PERCOCET/ROXICET) 5-325 MG per tablet 1-2 tablet  1-2 tablet Oral Q4H PRN [name], MD   2 tablet at 03/29/24 2311   zolpidem (AMBIEN) tablet 5 mg  5 mg Oral QHS PRN [name], MD   5 mg at 03/29/24 2058     Objective: Vital signs in last 24 hours: Temp:  [97.4 F (36.3 C)-98.1 F (36.7 C)] 98 F (36.7 C) (06/03 0402) Pulse Rate:  [68-119] 119 (06/03 0402) Resp:  [10-22] 16 (06/03 0402) BP: (105-151)/(70-126) 106/70 (06/03 0402) SpO2:  [89 %-100 %] 93 % (06/03 0402)  Intake/Output from previous day: 06/02 0701 - 06/03 0700 In: 1100 [I.V.:1100] Out: 700 [Urine:650; Blood:50] Intake/Output this shift: No intake/output data recorded.   Physical Exam Vitals and nursing note reviewed.  Constitutional:      Appearance: Normal appearance. She is normal weight.  HENT:     Head: Normocephalic and atraumatic.     Nose: Nose normal.     Mouth/Throat:     Mouth: Mucous membranes are dry.     Pharynx: Oropharynx is clear.  Eyes:     Extraocular Movements: Extraocular movements intact.  Conjunctiva/sclera: Conjunctivae normal.     Pupils: Pupils are equal, round, and reactive to light.  Pulmonary:     Comments: She is shallow breathing due to discomfort  Chest:     Chest wall: Tenderness present.  Abdominal:     General: There is no distension.     Palpations: Abdomen is soft. There is no mass.     Tenderness: There is abdominal tenderness. There is no right CVA tenderness, left CVA tenderness, guarding or rebound.     Hernia: No hernia is present.     Comments: Surgical incisions are clean and dry.  Abdomen appropriately tender for postoperative state.  Musculoskeletal:        General: Normal range of motion.     Cervical back: Normal range of motion.  Skin:    General: Skin is warm and dry.  Neurological:     General: No focal deficit present.     Mental Status: She is alert and oriented to person, place, and time.  Psychiatric:         Mood and Affect: Mood normal.        Behavior: Behavior normal.        Thought Content: Thought content normal.        Judgment: Judgment normal.     Lab Results:  Recent Labs    03/30/24 0359  WBC 8.8  HGB 10.6*  HCT 33.9*  PLT 293   BMET Recent Labs    03/30/24 0359  NA 137  K 4.0  CL 103  CO2 25  GLUCOSE 105*  BUN 13  CREATININE 0.76  CALCIUM 8.8*   PT/INR No results for input(s): "LABPROT", "INR" in the last 72 hours. ABG No results for input(s): "PHART", "HCO3" in the last 72 hours.  Invalid input(s): "PCO2", "PO2"  Studies/Results: DG Chest Port 1 View Result Date: 03/29/2024 CLINICAL DATA:  Chest pain EXAM: PORTABLE CHEST 1 VIEW COMPARISON:  Chest x-ray 02/17/2024 FINDINGS: Cardiomediastinal silhouette is within normal limits. Patchy left mid and lower lung airspace opacities are new from prior. There is a band of opacity in the right lower lung, unchanged. There is no pleural effusion or pneumothorax. No acute fractures are seen. IMPRESSION: 1. Patchy left mid and lower lung airspace opacities are new from prior, concerning for pneumonia. 2. Band of opacity in the right lower lung, unchanged, likely atelectasis. Electronically Signed   By: Tyron Gallon M.D.   On: 03/29/2024 18:08     Assessment: 63 year old woman who underwent right hand-assisted laparoscopic nephrectomy for right chronic XGP kidney with Dr. Ace Holder on March 29, 2024.  - Findings on this morning's x-ray are likely atelectasis versus pneumonia as she has no white count, she is satting normally, she is not tachypneic and she has no fever  -Her pain is likely due from the nephrectomy as it was her right side and the kidney was very inflamed and fixed to her liver   Plan: - Discontinue Foley - Encourage ambulation - Encourage incentive spirometer use -atelectasis seen on x-ray - Work towards pain management with p.o. medication - Advance diet as tolerated   LOS: 1 day    [facility]  [facility] 03/30/2024

## 2024-03-30 NOTE — Plan of Care (Signed)
  Problem: Education: Goal: Knowledge of General Education information will improve Description: Including pain rating scale, medication(s)/side effects and non-pharmacologic comfort measures Outcome: Progressing   Problem: Health Behavior/Discharge Planning: Goal: Ability to manage health-related needs will improve Outcome: Progressing   Problem: Activity: Goal: Risk for activity intolerance will decrease Outcome: Progressing   Problem: Nutrition: Goal: Adequate nutrition will be maintained Outcome: Progressing   Problem: Coping: Goal: Level of anxiety will decrease Outcome: Progressing   Problem: Elimination: Goal: Will not experience complications related to urinary retention Outcome: Progressing

## 2024-03-30 NOTE — Anesthesia Postprocedure Evaluation (Signed)
 Anesthesia Post Note  Patient: Belinda Oneill  Procedure(s) Performed: NEPHRECTOMY, RADICAL, LAPAROSCOPIC, ADULT (Right)  Patient location during evaluation: PACU Anesthesia Type: General Level of consciousness: awake and alert Pain management: pain level controlled Vital Signs Assessment: post-procedure vital signs reviewed and stable Respiratory status: spontaneous breathing, nonlabored ventilation and respiratory function stable Cardiovascular status: blood pressure returned to baseline and stable Postop Assessment: no apparent nausea or vomiting Anesthetic complications: no   No notable events documented.   Last Vitals:  Vitals:   03/30/24 0402 03/30/24 0729  BP: 106/70 102/69  Pulse: (!) 119 95  Resp: 16 14  Temp: 36.7 C 36.9 C  SpO2: 93% 100%    Last Pain:  Vitals:   03/30/24 0920  TempSrc:   PainSc: 7                  Baltazar Bonier

## 2024-03-30 NOTE — Progress Notes (Signed)
 Patient tolerated sitting in bedside recliner for approximately 2 hours. She was given and educated on incentive breathing spirometer use, and instructed to perform at least 10 times per hour. She also ambulated to the bathroom with the walker and minimal contact guard assistance. She refused ambulation in the hallway at this time, but encouraged to ambulate more frequently with staff assistance. Patient verbalized understanding. Will attempt to ambulate with patient later in the shift.

## 2024-03-31 ENCOUNTER — Inpatient Hospital Stay

## 2024-03-31 DIAGNOSIS — R52 Pain, unspecified: Secondary | ICD-10-CM

## 2024-03-31 DIAGNOSIS — R000 Tachycardia, unspecified: Secondary | ICD-10-CM

## 2024-03-31 DIAGNOSIS — D72829 Elevated white blood cell count, unspecified: Secondary | ICD-10-CM

## 2024-03-31 DIAGNOSIS — N118 Other chronic tubulo-interstitial nephritis: Secondary | ICD-10-CM | POA: Diagnosis not present

## 2024-03-31 LAB — BASIC METABOLIC PANEL WITH GFR
Anion gap: 11 (ref 5–15)
BUN: 11 mg/dL (ref 8–23)
CO2: 21 mmol/L — ABNORMAL LOW (ref 22–32)
Calcium: 9.2 mg/dL (ref 8.9–10.3)
Chloride: 102 mmol/L (ref 98–111)
Creatinine, Ser: 0.81 mg/dL (ref 0.44–1.00)
GFR, Estimated: >60 mL/min (ref >60)
Glucose, Bld: 98 mg/dL (ref 70–99)
Potassium: 3.8 mmol/L (ref 3.5–5.1)
Sodium: 134 mmol/L — ABNORMAL LOW (ref 135–145)

## 2024-03-31 LAB — CBC
HCT: 31.5 % — ABNORMAL LOW (ref 36.0–46.0)
Hemoglobin: 10.3 g/dL — ABNORMAL LOW (ref 12.0–15.0)
MCH: 28.7 pg (ref 26.0–34.0)
MCHC: 32.7 g/dL (ref 30.0–36.0)
MCV: 87.7 fL (ref 80.0–100.0)
Platelets: 271 10*3/uL (ref 150–400)
RBC: 3.59 MIL/uL — ABNORMAL LOW (ref 3.87–5.11)
RDW: 18.2 % — ABNORMAL HIGH (ref 11.5–15.5)
WBC: 11.2 10*3/uL — ABNORMAL HIGH (ref 4.0–10.5)
nRBC: 0 % (ref 0.0–0.2)

## 2024-03-31 MED ORDER — IOHEXOL 350 MG/ML SOLN
75.0000 mL | Freq: Once | INTRAVENOUS | Status: AC | PRN
Start: 2024-03-31 — End: 2024-03-31
  Administered 2024-03-31: 75 mL via INTRAVENOUS

## 2024-03-31 NOTE — Final Consult Note (Signed)
 Brief consult note, full note to follow.  Patient seen and examined. Being appropriately managed by primary team. Will follow with you.  I've ordered repeat CXR and labs for am tomorrow. HR controlled for now.  D/w Cathleen Coach.

## 2024-03-31 NOTE — Progress Notes (Addendum)
 0630: Transferred patient to and from [facility] to urinate. As patient transferred back to bed, patient became tachypneic with respirations in mid 20s and said she was feeling short of breath. Placed pulse ox on patient to find that patient was satting well at 96% on 2L but had increased tachycardia at high 140s to low 150s, which is increased from 100-110s that was present during shift. BP noted to be 141/96 with MAP of 109. Pt denies palpitations or chest pain at this time. Pt notes continued 8/10 ABD pain which has been constant since surgical procedure several days prior. Pt notes no other complaints at this time. EKG was obtained and dilaudid  0.5mg  given for pain. [name], MD notified of new onset tachycardia. Handoff given to Tenisha Ragland-Colvin, RN at change of shift, who was informed of event.

## 2024-03-31 NOTE — Progress Notes (Signed)
 Urology Consult Follow Up  Subjective: POD # 2  Patient complaining of SOB and right sided chest pain.    Patient is tachypneic with tachycardia satting 97% on 2 L of nasal cannula.  Her serum creatinine is 0.81.  Her WBC is up from yesterday 11.2 hemoglobin hematocrit stable at 10.3/31.5.  She denies any difficulty with urination and states she is voiding yellow clear urine.  Anti-infectives: Anti-infectives (From admission, onward)    Start     Dose/Rate Route Frequency Ordered Stop   03/29/24 1600  ceFAZolin (ANCEF) IVPB 1 g/50 mL premix        1 g 100 mL/hr over 30 Minutes Intravenous Every 8 hours 03/29/24 1224 03/29/24 2341   03/29/24 0612  ceFAZolin (ANCEF) IVPB 2g/100 mL premix        2 g 200 mL/hr over 30 Minutes Intravenous 30 min pre-op 03/29/24 0612 03/29/24 0830       Current Facility-Administered Medications  Medication Dose Route Frequency Provider Last Rate Last Admin   acetaminophen  (TYLENOL ) tablet 650 mg  650 mg Oral Q4H PRN [name], MD       diphenhydrAMINE (BENADRYL) injection 12.5 mg  12.5 mg Intravenous Q6H PRN [name], MD       Or   diphenhydrAMINE (BENADRYL) 12.5 MG/5ML elixir 12.5 mg  12.5 mg Oral Q6H PRN [name], MD       docusate sodium  (COLACE) capsule 100 mg  100 mg Oral BID [name], MD   100 mg at 03/29/24 1452   heparin  injection 5,000 Units  5,000 Units Subcutaneous Q8H [name], MD   5,000 Units at 03/30/24 2038   HYDROmorphone  (DILAUDID ) injection 0.5 mg  0.5 mg Intravenous Q2H PRN [name], MD   0.5 mg at 03/31/24 0655   ondansetron  (ZOFRAN ) injection 4 mg  4 mg Intravenous Q4H PRN [name], MD       Oral care mouth rinse  15 mL Mouth Rinse PRN [name], MD       oxyCODONE -acetaminophen  (PERCOCET/ROXICET) 5-325 MG per tablet 1-2 tablet  1-2 tablet Oral Q4H PRN [name], MD   2 tablet at 03/30/24 2137   zolpidem (AMBIEN) tablet 5 mg  5 mg Oral QHS PRN [name], MD   5 mg at  03/30/24 2137     Objective: Vital signs in last 24 hours: Temp:  [97.9 F (36.6 C)-99.2 F (37.3 C)] 99 F (37.2 C) (06/04 0738) Pulse Rate:  [49-149] 146 (06/04 0738) Resp:  [14-26] 20 (06/04 0738) BP: (111-141)/(71-96) 111/89 (06/04 0738) SpO2:  [88 %-100 %] 97 % (06/04 0738)  Intake/Output from previous day: 06/03 0701 - 06/04 0700 In: 1825.1 [I.V.:1825.1] Out: 850 [Urine:850] Intake/Output this shift: No intake/output data recorded.   Physical Exam Vitals and nursing note reviewed.  Constitutional:      Appearance: Normal appearance.  HENT:     Head: Normocephalic.     Nose: Nose normal.     Mouth/Throat:     Mouth: Mucous membranes are moist.     Pharynx: Oropharynx is clear.  Eyes:     Extraocular Movements: Extraocular movements intact.     Conjunctiva/sclera: Conjunctivae normal.     Pupils: Pupils are equal, round, and reactive to light.  Cardiovascular:     Rate and Rhythm: Tachycardia present.  Pulmonary:     Comments: Increased effort for respirations  Abdominal:     General: Abdomen is flat.     Palpations: Abdomen is soft.  Comments: Surgical incisions are clean and dry  Musculoskeletal:        General: Normal range of motion.     Cervical back: Normal range of motion.  Skin:    General: Skin is warm and dry.  Neurological:     General: No focal deficit present.     Mental Status: She is alert and oriented to person, place, and time.  Psychiatric:        Mood and Affect: Mood normal.        Behavior: Behavior normal.        Thought Content: Thought content normal.        Judgment: Judgment normal.    Lab Results:  Recent Labs    03/30/24 0359 03/31/24 0230  WBC 8.8 11.2*  HGB 10.6* 10.3*  HCT 33.9* 31.5*  PLT 293 271   BMET Recent Labs    03/30/24 0359 03/31/24 0230  NA 137 134*  K 4.0 3.8  CL 103 102  CO2 25 21*  GLUCOSE 105* 98  BUN 13 11  CREATININE 0.76 0.81  CALCIUM 8.8* 9.2   PT/INR No results for input(s):  "LABPROT", "INR" in the last 72 hours. ABG No results for input(s): "PHART", "HCO3" in the last 72 hours.  Invalid input(s): "PCO2", "PO2"  Studies/Results: DG Chest Port 1 View Result Date: 03/29/2024 CLINICAL DATA:  Chest pain EXAM: PORTABLE CHEST 1 VIEW COMPARISON:  Chest x-ray 02/17/2024 FINDINGS: Cardiomediastinal silhouette is within normal limits. Patchy left mid and lower lung airspace opacities are new from prior. There is a band of opacity in the right lower lung, unchanged. There is no pleural effusion or pneumothorax. No acute fractures are seen. IMPRESSION: 1. Patchy left mid and lower lung airspace opacities are new from prior, concerning for pneumonia. 2. Band of opacity in the right lower lung, unchanged, likely atelectasis. Electronically Signed   By: Tyron Gallon M.D.   On: 03/29/2024 18:08     Assessment: 63 year old woman who underwent right hand-assisted laparoscopic nephrectomy for right chronic XGP kidney with Dr. Ace Holder on March 29, 2024  - Patient with increased work of breathing and is now tachycardic and tachypneic  - CTPE negative for PE and aortic dissection  Plan: - CTPE was negative for acute PE or thoracic aortic dissection, there is a small right pleural effusion and dependent consolidation/atelectasis in the lung bases with right being worse than left - We will consult the hospitalist service to assist with management of CT PE findings, I have placed orders and page the hospitalist service - Encourage ambulation - Advance diet as tolerated      LOS: 2 days    [facility] 03/31/2024

## 2024-03-31 NOTE — Consult Note (Signed)
 Initial Consultation Note   Patient: Belinda Oneill ZOX:[id number] DOB: July 26, 1961 PCP: Pcp, No DOA: 03/29/2024 DOS: the patient was seen and examined on 03/31/2024 Primary service: [name], MD  Referring physician: [name], MD Reason for consult: Tachycardia, tachypnea, shortness of breath  Assessment/Plan: Assessment and Plan:  63 year old female with history of GERD, renal stones, recent percutaneous nephrostomy placement on 01/15/2024 and multiple/4 readmissions in last 67-month who underwent laparoscopic nephrectomy for right chronic XGP kidney with Dr. Ace Holder on March 29, 2024.  We are being consulted for medical management for shortness of breath, tachycardia, tachypnea.  Shortness of breath, tachycardia, tachypnea When I evaluated the patient her heart rate had been trending down although it had been running up as high as 150 followed by 130s.  This is likely due to uncontrolled pain.  Her tachypnea and shortness of breath also seem to be likely related.  Chest x-ray is not convincing for any new pneumonia - CT chest shows small right pleural effusion.  PE ruled out.  Some free intraperitoneal gas likely postoperative related - Recommend incentive spirometry, repeat chest x-ray in the morning.  I have ordered.  Will check CBC and BMP in the morning as well  Transient hypotension She did have 1 low blood pressure reported of 85/63 which was improved on recheck.  She remained asymptomatic  S/p laparoscopic nephrectomy for right chronic XGP kidney  .  Management per urology.  Patient is appropriately improving postop day 2    TR[facility] will continue to follow the patient.  HPI: Belinda Oneill is a 63 y.o. female with past medical history of GERD, renal stones, recent percutaneous nephrostomy placement on 01/15/2024 and multiple/4 readmissions in last 69-month who underwent laparoscopic nephrectomy for right chronic XGP kidney on June 2.  Today's postop day 2.  Patient is  appropriately improving postoperatively.  Except some pain control issue.  I do not see any obvious signs of infection/pneumonia.  Will closely monitor for now.  She denies any other new symptoms.  Review of Systems: As mentioned in the history of present illness. All other systems reviewed and are negative. Past Medical History:  Diagnosis Date   Anemia    Aortic atherosclerosis (HCC)    Cardiomegaly    Cholelithiasis    Essential hypertension 01/14/2024   GERD (gastroesophageal reflux disease)    Kidney stones    Left upper lobe pulmonary nodule    Pulmonary emphysema (HCC)    Past Surgical History:  Procedure Laterality Date   CYSTOSCOPY WITH STENT PLACEMENT Right 02/03/2024   Procedure: CYSTOSCOPY, WITH STENT INSERTION;  Surgeon: [name], MD;  Location: [facility] ORS;  Service: Urology;  Laterality: Right;   IR NEPHROSTOMY EXCHANGE RIGHT  01/28/2024   IR NEPHROSTOMY PLACEMENT RIGHT  01/15/2024   LAPAROSCOPIC NEPHRECTOMY Right 03/29/2024   Procedure: NEPHRECTOMY, RADICAL, LAPAROSCOPIC, ADULT;  Surgeon: [name], MD;  Location: [facility] ORS;  Service: Urology;  Laterality: Right;  Hand Assisted   LITHOTRIPSY     Social History:  reports that she has been smoking cigarettes. She has a 10 pack-year smoking history. She has never used smokeless tobacco. She reports current alcohol use. She reports that she does not currently use drugs after having used the following drugs: Marijuana.  Allergies  Allergen Reactions   Levaquin  [Levofloxacin ] Other (See Comments)    Moderate to severe joint pain/muscle pain while on the medication.    Family History  Problem Relation Age of Onset   Breast cancer Maternal Aunt  Prior to Admission medications   Medication Sig Start Date End Date Taking? Authorizing Provider  acetaminophen  (TYLENOL ) 500 MG tablet Take 2 tablets (1,000 mg total) by mouth every 6 (six) hours as needed. 01/05/24 01/04/25 Yes Evans, Alexandra, PA-C  Cal Carb-Mag  Hydrox-Simeth ([name]) 1000-200-40 MG CHEW Chew 1 tablet by mouth 3 (three) times daily as needed (indigestion/heartburn.). Rolaids Fast Relief of Occasional Heartburn and Acid Indigestion Smooth Berry Power Gummies   Yes [provider]  ferrous sulfate  325 (65 FE) MG tablet Take 325 mg by mouth in the morning.   Yes [provider]  levofloxacin  (LEVAQUIN ) 750 MG tablet Take 1 tablet (750 mg total) by mouth daily. 03/25/24  Yes [name], MD  oxybutynin  (DITROPAN ) 5 MG tablet Take 1 tablet (5 mg total) by mouth every 8 (eight) hours as needed for bladder spasms. Patient not taking: Reported on 03/23/2024 02/12/24  Yes Amin, Sumayya, MD  senna-docusate (SENOKOT-S) 8.6-50 MG tablet Take 1 tablet by mouth 2 (two) times daily as needed for mild constipation.   Yes [provider]  tamsulosin  (FLOMAX ) 0.4 MG CAPS capsule Take 1 capsule (0.4 mg total) by mouth daily after supper. 03/02/24  Yes [name], MD  Vibegron  (GEMTESA ) 75 MG TABS Take 1 tablet (75 mg total) by mouth daily. 03/02/24  Yes [name], MD    Physical Exam: Vitals:   03/31/24 0840 03/31/24 1240 03/31/24 1645 03/31/24 1752  BP: (!) 155/105 116/87 (!) 85/63 112/62  Pulse: (!) 150 (!) 138 93   Resp: 19 18 19    Temp: 98.7 F (37.1 C) 99 F (37.2 C) 98.2 F (36.8 C)   TempSrc: Oral Oral Oral   SpO2: 98% 99% 53%    63 year old female lying in the bed comfortably without any acute distress Lungs clear to auscultation bilaterally Heart regular rate and rhythm Abdomen soft, benign, mild abdominal tenderness likely postsurgical.  Surgical incision dry and clean. Neuro: Alert and awake, nonfocal Psych normal mood and affect Data Reviewed:   WBC 11.2    Family Communication: None at bedside Primary team communication: I have discussed this with consulting provider/Shannon. Thank you very much for involving us  in the care of your patient.  Author: Brenna Cam, MD 03/31/2024 8:44  PM  For on call review [email or web address].

## 2024-03-31 NOTE — Plan of Care (Signed)

## 2024-03-31 NOTE — Plan of Care (Signed)
  Problem: Education: Goal: Knowledge of General Education information will improve Description: Including pain rating scale, medication(s)/side effects and non-pharmacologic comfort measures Outcome: Progressing   Problem: Health Behavior/Discharge Planning: Goal: Ability to manage health-related needs will improve Outcome: Progressing   Problem: Activity: Goal: Risk for activity intolerance will decrease Outcome: Progressing   Problem: Coping: Goal: Level of anxiety will decrease Outcome: Progressing   Problem: Elimination: Goal: Will not experience complications related to urinary retention Outcome: Progressing

## 2024-04-01 ENCOUNTER — Inpatient Hospital Stay

## 2024-04-01 DIAGNOSIS — N12 Tubulo-interstitial nephritis, not specified as acute or chronic: Secondary | ICD-10-CM

## 2024-04-01 LAB — BASIC METABOLIC PANEL WITH GFR
Anion gap: 9 (ref 5–15)
BUN: 11 mg/dL (ref 8–23)
CO2: 26 mmol/L (ref 22–32)
Calcium: 9.2 mg/dL (ref 8.9–10.3)
Chloride: 102 mmol/L (ref 98–111)
Creatinine, Ser: 0.72 mg/dL (ref 0.44–1.00)
GFR, Estimated: >60 mL/min (ref >60)
Glucose, Bld: 101 mg/dL — ABNORMAL HIGH (ref 70–99)
Potassium: 3.6 mmol/L (ref 3.5–5.1)
Sodium: 137 mmol/L (ref 135–145)

## 2024-04-01 LAB — CBC
HCT: 30.4 % — ABNORMAL LOW (ref 36.0–46.0)
Hemoglobin: 10 g/dL — ABNORMAL LOW (ref 12.0–15.0)
MCH: 28.6 pg (ref 26.0–34.0)
MCHC: 32.9 g/dL (ref 30.0–36.0)
MCV: 86.9 fL (ref 80.0–100.0)
Platelets: 290 10*3/uL (ref 150–400)
RBC: 3.5 MIL/uL — ABNORMAL LOW (ref 3.87–5.11)
RDW: 18.1 % — ABNORMAL HIGH (ref 11.5–15.5)
WBC: 9.2 10*3/uL (ref 4.0–10.5)
nRBC: 0 % (ref 0.0–0.2)

## 2024-04-01 MED ORDER — HYDROMORPHONE HCL 2 MG PO TABS
4.0000 mg | ORAL_TABLET | ORAL | Status: DC | PRN
  Administered 2024-04-01 – 2024-04-05 (×21): 4 mg via ORAL
  Filled 2024-04-01 (×20): qty 2

## 2024-04-01 MED ORDER — HYDROMORPHONE HCL 2 MG PO TABS
2.0000 mg | ORAL_TABLET | ORAL | Status: DC | PRN
  Administered 2024-04-01: 2 mg via ORAL
  Filled 2024-04-01 (×3): qty 1

## 2024-04-01 NOTE — Evaluation (Signed)
 Physical Therapy Evaluation Patient Details Name: Belinda Oneill MRN: [id number] DOB: 01-09-1961 Today's Date: 04/01/2024  History of Present Illness  Pt is a 19 female s/p laparoscopic R radical nephrectomy, R ureteral stent removal. Workup for UTI as well. PMH of HTN, kidney stones.  Clinical Impression  Patient A&Ox4, did report 10/10 pain but had been medicated ~30 minutes prior to PT arrival. Per pt at baseline she is independent, lives with family. Agreeable to mobility to ambulate to bathroom. Supine to sit with extra time, CGA, use of bed rails. Sit <> Stand with CGA and no AD, and able to ambulate in room CGA. Did reach for external support throughout, educated on potential benefit of RW for now, pt agreeable. spO2 did drop to 83% on room air (trialed with RN consent), 2L donned and RN notified. Able to void and perform pericare with supervision, minA to assist with brief change due to abdominal pain. Returned to bed with needs in reach, pt declined OOB to chair.  Overall the patient demonstrated deficits (see "PT Problem List") that impede the patient's functional abilities, safety, and mobility and would benefit from skilled PT intervention.          If plan is discharge home, recommend the following: A little help with walking and/or transfers;A little help with bathing/dressing/bathroom;Assistance with cooking/housework;Assist for transportation;Help with stairs or ramp for entrance   Can travel by private vehicle        Equipment Recommendations Rolling walker (2 wheels)  Recommendations for Other Services       Functional Status Assessment Patient has had a recent decline in their functional status and demonstrates the ability to make significant improvements in function in a reasonable and predictable amount of time.     Precautions / Restrictions Precautions Precautions: Fall Recall of Precautions/Restrictions: Intact Restrictions Weight Bearing Restrictions Per  Provider Order: No      Mobility  Bed Mobility Overal bed mobility: Needs Assistance Bed Mobility: Supine to Sit, Sit to Supine     Supine to sit: Contact guard, HOB elevated, Used rails Sit to supine: Min assist, HOB elevated   General bed mobility comments: minA to assist with returning BLE to bed due to elevated pain    Transfers Overall transfer level: Needs assistance Equipment used: None, 1 person hand held assist Transfers: Sit to/from Stand Sit to Stand: Contact guard assist                Ambulation/Gait Ambulation/Gait assistance: Contact guard assist Gait Distance (Feet): 20 Feet Assistive device: None, 1 person hand held assist         General Gait Details: pt did reach for external support throughout. discussed using RW, agreeable moving forward. spO2 83% when ambulating on room air, [place] 2L donned, RN notified  Stairs            Wheelchair Mobility     Tilt Bed    Modified Rankin (Stroke Patients Only)       Balance Overall balance assessment: Needs assistance Sitting-balance support: Feet supported Sitting balance-Leahy Scale: Good Sitting balance - Comments: pericare in sitting   Standing balance support: Bilateral upper extremity supported, Single extremity supported, During functional activity Standing balance-Leahy Scale: Fair                               Pertinent Vitals/Pain Pain Assessment Pain Assessment: 0-10 Pain Score: 10-Worst pain ever Pain Location: abdomen/back Pain Descriptors /  Indicators: Spasm, Sore, Aching, Grimacing, Guarding, Moaning Pain Intervention(s): Limited activity within patient's tolerance, Monitored during session, Premedicated before session, Repositioned    Home Living Family/patient expects to be discharged to:: Private residence Living Arrangements: Children;Parent Available Help at Discharge: Family Type of Home: House Home Access: Stairs to enter   Secretary/administrator  of Steps: 3   Home Layout: One level Home Equipment: None      Prior Function Prior Level of Function : Independent/Modified Independent                     Extremity/Trunk Assessment   Upper Extremity Assessment Upper Extremity Assessment: Overall WFL for tasks assessed    Lower Extremity Assessment Lower Extremity Assessment: Generalized weakness       Communication   Communication Communication: No apparent difficulties    Cognition Arousal: Alert Behavior During Therapy: WFL for tasks assessed/performed   PT - Cognitive impairments: No apparent impairments                                 Cueing Cueing Techniques: Verbal cues, Gestural cues, Tactile cues     General Comments      Exercises     Assessment/Plan    PT Assessment Patient needs continued PT services  PT Problem List Decreased strength;Decreased activity tolerance;Decreased balance;Decreased mobility;Decreased range of motion;Decreased knowledge of precautions       PT Treatment Interventions DME instruction;Balance training;Gait training;Neuromuscular re-education;Stair training;Functional mobility training;Patient/family education;Therapeutic activities;Therapeutic exercise    PT Goals (Current goals can be found in the Care Plan section)  Acute Rehab PT Goals Patient Stated Goal: to have less pain PT Goal Formulation: With patient Time For Goal Achievement: 04/15/24 Potential to Achieve Goals: Good    Frequency Min 3X/week     Co-evaluation               AM-PAC PT "6 Clicks" Mobility  Outcome Measure Help needed turning from your back to your side while in a flat bed without using bedrails?: A Little Help needed moving from lying on your back to sitting on the side of a flat bed without using bedrails?: A Little Help needed moving to and from a bed to a chair (including a wheelchair)?: A Little Help needed standing up from a chair using your arms (e.g.,  wheelchair or bedside chair)?: A Little Help needed to walk in hospital room?: A Little Help needed climbing 3-5 steps with a railing? : A Little 6 Click Score: 18    End of Session   Activity Tolerance: Patient limited by pain Patient left: in bed;with call bell/phone within reach;with bed alarm set Nurse Communication: Mobility status PT Visit Diagnosis: Other abnormalities of gait and mobility (R26.89);Difficulty in walking, not elsewhere classified (R26.2);Muscle weakness (generalized) (M62.81)    Time: 8295-6213 PT Time Calculation (min) (ACUTE ONLY): 20 min   Charges:   PT Evaluation $PT Eval Low Complexity: 1 Low PT Treatments $Therapeutic Activity: 8-22 mins PT General Charges $$ ACUTE PT VISIT: 1 Visit         Darien Eden PT, DPT 2:15 PM,04/01/24

## 2024-04-01 NOTE — Final Consult Note (Signed)
 Her white count has normalized.  Chest x-ray looks unchanged from before.  Discussed with primary team.  Patient is medically stable.  Will sign off for now.  Please reach out if any questions.

## 2024-04-01 NOTE — Progress Notes (Signed)
 Urology Inpatient Progress Note  Subjective: No acute events overnight.  She is afebrile.  Tachycardia is improving.  Hemoglobin stable, 10.0.  WBC count down, 9.2.  Creatinine stable, 0.72. Repeat chest x-ray this morning is stable, medicine has signed off. She is using her incentive spirometer.  She has ambulated to the bathroom and feels good on her feet.  Her pain is poorly controlled, currently 8/10 in intensity.  She denies nausea or vomiting.  Anti-infectives: Anti-infectives (From admission, onward)    Start     Dose/Rate Route Frequency Ordered Stop   03/29/24 1600  ceFAZolin (ANCEF) IVPB 1 g/50 mL premix        1 g 100 mL/hr over 30 Minutes Intravenous Every 8 hours 03/29/24 1224 03/29/24 2341   03/29/24 0612  ceFAZolin (ANCEF) IVPB 2g/100 mL premix        2 g 200 mL/hr over 30 Minutes Intravenous 30 min pre-op 03/29/24 0612 03/29/24 0830       Current Facility-Administered Medications  Medication Dose Route Frequency Provider Last Rate Last Admin   acetaminophen  (TYLENOL ) tablet 650 mg  650 mg Oral Q4H PRN [name], MD       diphenhydrAMINE (BENADRYL) injection 12.5 mg  12.5 mg Intravenous Q6H PRN [name], MD       Or   diphenhydrAMINE (BENADRYL) 12.5 MG/5ML elixir 12.5 mg  12.5 mg Oral Q6H PRN [name], MD       docusate sodium  (COLACE) capsule 100 mg  100 mg Oral BID [name], MD   100 mg at 03/31/24 1056   heparin  injection 5,000 Units  5,000 Units Subcutaneous Q8H [name], MD   5,000 Units at 04/01/24 2130   HYDROmorphone  (DILAUDID ) injection 0.5 mg  0.5 mg Intravenous Q2H PRN [name], MD   0.5 mg at 04/01/24 0046   ondansetron  (ZOFRAN ) injection 4 mg  4 mg Intravenous Q4H PRN [name], MD       Oral care mouth rinse  15 mL Mouth Rinse PRN [name], MD       oxyCODONE -acetaminophen  (PERCOCET/ROXICET) 5-325 MG per tablet 1-2 tablet  1-2 tablet Oral Q4H PRN [name], MD   2 tablet at 04/01/24 0549    zolpidem (AMBIEN) tablet 5 mg  5 mg Oral QHS PRN [name], MD   5 mg at 03/31/24 2119   Objective: Vital signs in last 24 hours: Temp:  [98.2 F (36.8 C)-99.4 F (37.4 C)] 98.3 F (36.8 C) (06/05 0730) Pulse Rate:  [88-138] 88 (06/05 0730) Resp:  [18-20] 19 (06/05 0730) BP: (85-127)/(62-87) 91/65 (06/05 0730) SpO2:  [98 %-100 %] 100 % (06/05 0730)  Intake/Output from previous day: 06/04 0701 - 06/05 0700 In: 240 [P.O.:240] Out: -  Intake/Output this shift: No intake/output data recorded.  Physical Exam Vitals and nursing note reviewed.  Constitutional:      General: She is not in acute distress.    Appearance: She is not ill-appearing, toxic-appearing or diaphoretic.  Pulmonary:     Effort: Pulmonary effort is normal. No respiratory distress.  Abdominal:     General: There is no distension.     Palpations: Abdomen is soft.     Tenderness: There is no rebound.     Comments: Incisions clean/dry/intact with overlying surgical adhesive.  No ecchymosis.  Skin:    General: Skin is warm and dry.  Neurological:     Mental Status: She is alert and oriented to person, place, and time.  Psychiatric:  Mood and Affect: Mood normal.        Behavior: Behavior normal.    Lab Results:  Recent Labs    03/31/24 0230 04/01/24 0434  WBC 11.2* 9.2  HGB 10.3* 10.0*  HCT 31.5* 30.4*  PLT 271 290   BMET Recent Labs    03/31/24 0230 04/01/24 0434  NA 134* 137  K 3.8 3.6  CL 102 102  CO2 21* 26  GLUCOSE 98 101*  BUN 11 11  CREATININE 0.81 0.72  CALCIUM 9.2 9.2   PT/INR No results for input(s): "LABPROT", "INR" in the last 72 hours. ABG No results for input(s): "PHART", "HCO3" in the last 72 hours.  Invalid input(s): "PCO2", "PO2"  Studies/Results: DG Chest 2 View Result Date: 04/01/2024 CLINICAL DATA:  Dyspnea EXAM: CHEST - 2 VIEW COMPARISON:  March 29, 2024 FINDINGS: Accounting for differences in techniques no significant change right middle lobe atelectasis.  Left lower lobe infiltrates or atelectasis. Heart and mediastinum normal No significant pleural effusion IMPRESSION: No significant change compared with prior examination. Electronically Signed   By: Fredrich Jefferson M.D.   On: 04/01/2024 08:58   CT Angio Chest Pulmonary Embolism (PE) W or WO Contrast Result Date: 03/31/2024 CLINICAL DATA:  Pulmonary embolism (PE) suspected, high prob. Recent laparoscopic nephrectomy EXAM: CT ANGIOGRAPHY CHEST WITH CONTRAST TECHNIQUE: Multidetector CT imaging of the chest was performed using the standard protocol during bolus administration of intravenous contrast. Multiplanar CT image reconstructions and MIPs were obtained to evaluate the vascular anatomy. RADIATION DOSE REDUCTION: This exam was performed according to the departmental dose-optimization program which includes automated exposure control, adjustment of the mA and/or kV according to patient size and/or use of iterative reconstruction technique. CONTRAST:  75mL OMNIPAQUE  IOHEXOL  350 MG/ML SOLN COMPARISON:  02/09/2024 FINDINGS: Cardiovascular: Heart size normal. No pericardial effusion. Satisfactory opacification of pulmonary arteries noted, and there is no evidence of pulmonary emboli. Adequate contrast opacification of the thoracic aorta with no evidence of dissection, aneurysm, or stenosis. There is bovine variant brachiocephalic arch anatomy without proximal stenosis. Minimal calcified plaque in the descending thoracic aorta. Mediastinum/Nodes: No mediastinal hematoma, mass, or adenopathy. Lungs/Pleura: Small right pleural effusion. Dependent consolidation/atelectasis in the lung bases, right worse than left. Upper Abdomen: Free intraperitoneal gas, presumably postoperative. Gas and fluid in the visualized portion of the right renal fossa. 8 mm coarse peripheral calcification in the upper pole left kidney as before. Musculoskeletal: Vertebral endplate spurring at multiple levels in the mid thoracic spine. Spondylitic  changes in the visualized lower cervical spine. Review of the MIP images confirms the above findings. IMPRESSION: 1. Negative for acute PE or thoracic aortic dissection. 2. Small right pleural effusion with dependent consolidation/atelectasis in the lung bases, right worse than left. 3. Free intraperitoneal gas, presumably postoperative. 4.  Aortic Atherosclerosis (ICD10-I70.0).  For Electronically Signed   By: Nicoletta Barrier M.D.   On: 03/31/2024 09:31   Assessment & Plan: 63 y.o. female POD 3 from hand-assisted laparoscopic right radical nephrectomy and right ureteral stent removal with Dr. Ace Holder for management of chronic XGP kidney.  She continues to recover well from her procedure.  Chest x-ray is stable and medicine has signed off.  Low suspicion for infectious process with normal WBC count.  Will prioritize ambulation and pain control today.  Recommendations: - Continue incentive spirometry - Pain control - Ambulate with nursing  Kathreen Pare, PA-C 04/01/2024

## 2024-04-02 MED ORDER — CALCIUM CARBONATE ANTACID 500 MG PO CHEW
200.0000 mg | CHEWABLE_TABLET | Freq: Three times a day (TID) | ORAL | Status: DC | PRN
Start: 2024-04-02 — End: 2024-04-05
  Administered 2024-04-02 – 2024-04-04 (×3): 200 mg via ORAL
  Filled 2024-04-02 (×3): qty 1

## 2024-04-02 MED ORDER — MELATONIN 5 MG PO TABS
5.0000 mg | ORAL_TABLET | Freq: Every evening | ORAL | Status: DC | PRN
  Administered 2024-04-03 – 2024-04-04 (×3): 5 mg via ORAL
  Filled 2024-04-02 (×3): qty 1

## 2024-04-02 NOTE — Plan of Care (Signed)
  Problem: Clinical Measurements: Goal: Ability to maintain clinical measurements within normal limits will improve Outcome: Progressing Goal: Will remain free from infection Outcome: Progressing Goal: Diagnostic test results will improve Outcome: Progressing Goal: Respiratory complications will improve Outcome: Progressing Goal: Cardiovascular complication will be avoided Outcome: Progressing   Problem: Activity: Goal: Risk for activity intolerance will decrease Outcome: Progressing   Problem: Nutrition: Goal: Adequate nutrition will be maintained Outcome: Progressing   Problem: Coping: Goal: Level of anxiety will decrease Outcome: Progressing   Problem: Elimination: Goal: Will not experience complications related to bowel motility Outcome: Progressing Goal: Will not experience complications related to urinary retention Outcome: Progressing   Problem: Safety: Goal: Ability to remain free from injury will improve Outcome: Progressing   Problem: Skin Integrity: Goal: Risk for impaired skin integrity will decrease Outcome: Progressing   Problem: Education: Goal: Knowledge of the prescribed therapeutic regimen will improve Outcome: Progressing   Problem: Bowel/Gastric: Goal: Gastrointestinal status for postoperative course will improve Outcome: Progressing   Problem: Clinical Measurements: Goal: Postoperative complications will be avoided or minimized Outcome: Progressing   Problem: Respiratory: Goal: Ability to achieve and maintain a regular respiratory rate will improve Outcome: Progressing   Problem: Skin Integrity: Goal: Demonstration of wound healing without infection will improve Outcome: Progressing   Problem: Urinary Elimination: Goal: Ability to avoid or minimize complications of infection will improve Outcome: Progressing Goal: Ability to achieve and maintain urine output will improve Outcome: Progressing   Problem: Pain Managment: Goal: General  experience of comfort will improve and/or be controlled Outcome: Not Progressing

## 2024-04-02 NOTE — Progress Notes (Signed)
 Urology Inpatient Progress Note  Subjective: No acute events overnight. She is afebrile, VSS.  Tachycardia has resolved.  She is satting well on room air. She has been working with PT and OT and using her incentive spirometer. Today she reports ongoing right-sided abdominal and chest pain, stable.  She states OT has helped her better use the incentive spirometer and she has been able to cough up some sputum. She is adamantly opposed to going home today and states that she expects to be at 85-90% of her baseline before she goes home.  She currently rates herself at 40%.  She states that if we send her home she will call her lawyers.  She is very concerned about bouncing back to the hospital if she is sent home too soon.  Anti-infectives: Anti-infectives (From admission, onward)    Start     Dose/Rate Route Frequency Ordered Stop   03/29/24 1600  ceFAZolin (ANCEF) IVPB 1 g/50 mL premix        1 g 100 mL/hr over 30 Minutes Intravenous Every 8 hours 03/29/24 1224 03/29/24 2341   03/29/24 0612  ceFAZolin (ANCEF) IVPB 2g/100 mL premix        2 g 200 mL/hr over 30 Minutes Intravenous 30 min pre-op 03/29/24 0612 03/29/24 0830       Current Facility-Administered Medications  Medication Dose Route Frequency Provider Last Rate Last Admin   acetaminophen  (TYLENOL ) tablet 650 mg  650 mg Oral Q4H PRN [name], MD   650 mg at 04/02/24 0554   diphenhydrAMINE (BENADRYL) injection 12.5 mg  12.5 mg Intravenous Q6H PRN [name], MD       Or   diphenhydrAMINE (BENADRYL) 12.5 MG/5ML elixir 12.5 mg  12.5 mg Oral Q6H PRN [name], MD       docusate sodium  (COLACE) capsule 100 mg  100 mg Oral BID [name], MD   100 mg at 04/01/24 2123   heparin  injection 5,000 Units  5,000 Units Subcutaneous Q8H Brandon, Ashley, MD   5,000 Units at 04/02/24 0530   HYDROmorphone  (DILAUDID ) tablet 2 mg  2 mg Oral Q4H PRN Aily Tzeng, PA-C   2 mg at 04/01/24 1804   HYDROmorphone  (DILAUDID )  tablet 4 mg  4 mg Oral Q4H PRN Sufian Ravi, PA-C   4 mg at 04/02/24 1610   ondansetron  (ZOFRAN ) injection 4 mg  4 mg Intravenous Q4H PRN [name], MD       Oral care mouth rinse  15 mL Mouth Rinse PRN [name], MD       zolpidem Lourdes Roy) tablet 5 mg  5 mg Oral QHS PRN [name], MD   5 mg at 03/31/24 2119   Objective: Vital signs in last 24 hours: Temp:  [98 F (36.7 C)-100.2 F (37.9 C)] 98.4 F (36.9 C) (06/06 0755) Pulse Rate:  [86-109] 86 (06/06 0755) Resp:  [16-20] 19 (06/06 0755) BP: (99-115)/(67-77) 99/67 (06/06 0755) SpO2:  [93 %-98 %] 97 % (06/06 0755)  Intake/Output from previous day: 06/05 0701 - 06/06 0700 In: 480 [P.O.:480] Out: 0  Intake/Output this shift: No intake/output data recorded.  Physical Exam Vitals and nursing note reviewed.  Constitutional:      General: She is not in acute distress.    Appearance: She is not ill-appearing, toxic-appearing or diaphoretic.  HENT:     Head: Normocephalic and atraumatic.  Pulmonary:     Effort: Pulmonary effort is normal. No respiratory distress.  Skin:    General: Skin is warm  and dry.  Neurological:     Mental Status: She is alert and oriented to person, place, and time.  Psychiatric:        Mood and Affect: Mood normal.        Behavior: Behavior normal.    Lab Results:  Recent Labs    03/31/24 0230 04/01/24 0434  WBC 11.2* 9.2  HGB 10.3* 10.0*  HCT 31.5* 30.4*  PLT 271 290   BMET Recent Labs    03/31/24 0230 04/01/24 0434  NA 134* 137  K 3.8 3.6  CL 102 102  CO2 21* 26  GLUCOSE 98 101*  BUN 11 11  CREATININE 0.81 0.72  CALCIUM 9.2 9.2   Studies/Results: DG Chest 2 View Result Date: 04/01/2024 CLINICAL DATA:  Dyspnea EXAM: CHEST - 2 VIEW COMPARISON:  March 29, 2024 FINDINGS: Accounting for differences in techniques no significant change right middle lobe atelectasis. Left lower lobe infiltrates or atelectasis. Heart and mediastinum normal No significant pleural  effusion IMPRESSION: No significant change compared with prior examination. Electronically Signed   By: Fredrich Jefferson M.D.   On: 04/01/2024 08:58   Assessment & Plan: 63 y.o. female POD 4 from hand-assisted laparoscopic right radical nephrectomy and right ureteral stent removal with Dr. Ace Holder for management of chronic XGP kidney.  She continues to recover well from her procedure.  Pain control continues to be an area for improvement as well as general readiness to return home.  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  We also discussed the risks of prolonged hospitalization including hospital-acquired infections.  Ultimately, we agreed to prioritize incentive spirometry and working with PT and OT today to progress her toward readiness for discharge.  Recommendations: - Continue incentive spirometry - Continue working with PT OT - Pain control (oral only)  Kathreen Pare, PA-C 04/02/2024

## 2024-04-02 NOTE — Progress Notes (Signed)
 Physical Therapy Treatment Patient Details Name: Belinda Oneill MRN: [id number] DOB: Sep 08, 1961 Today's Date: 04/02/2024   History of Present Illness Pt is a 67 female s/p laparoscopic R radical nephrectomy, R ureteral stent removal. Workup for UTI as well. PMH of HTN, kidney stones.    PT Comments  Pt resting in bed upon PT arrival; pt agreeable to therapy session.  Pt reporting 10/10 abdominal pain throughout session (pt did report pain was better resting in bed pre/post ambulation than during ambulation)--discussed with pt's nurse--pt received pain medication already this morning and not due yet for additional pain medication; pt repositioned in bed to improve comfort end of session.  Pt ambulated around nursing station with RW use (therapist offered to limit distance ambulated but pt appearing motivated and wanted to try to walk around nursing loop).  Pt appears better (improved posture and gait mechanics) walking with RW (pt requiring UE support when attempting to walk without walker use).  SpO2 sats 95% post ambulation on room air (although mild SOB noted post ambulation).  Will continue to focus on strengthening, balance, and progressive functional mobility during hospitalization.   If plan is discharge home, recommend the following: A little help with walking and/or transfers;A little help with bathing/dressing/bathroom;Assistance with cooking/housework;Assist for transportation;Help with stairs or ramp for entrance   Can travel by private vehicle        Equipment Recommendations  Rolling walker (2 wheels)    Recommendations for Other Services       Precautions / Restrictions Precautions Precautions: Fall Recall of Precautions/Restrictions: Intact Restrictions Weight Bearing Restrictions Per Provider Order: No     Mobility  Bed Mobility Overal bed mobility: Needs Assistance Bed Mobility: Supine to Sit, Sit to Supine     Supine to sit: Supervision, HOB elevated Sit to  supine: Min assist, HOB elevated   General bed mobility comments: assist for B LE's laying back down in bed end of session d/t elevated pain    Transfers Overall transfer level: Needs assistance Equipment used: None Transfers: Sit to/from Stand Sit to Stand: Contact guard assist           General transfer comment: pt holding onto sink counter and bed to stand (pt declining use of walker)    Ambulation/Gait Ambulation/Gait assistance: Contact guard assist   Assistive device: Rolling walker (2 wheels)   Gait velocity: decreased     General Gait Details: pt initially declining use of walker and instead holding onto counter and bed first few feet of ambulation (pt in flexed posture) but then pt agreeable to ambulate with RW use and improved upright posture noted with increased distance ambulating (although still mildly flexed d/t abdominal pain); partial step through gait pattern   Stairs             Wheelchair Mobility     Tilt Bed    Modified Rankin (Stroke Patients Only)       Balance Overall balance assessment: Needs assistance Sitting-balance support: No upper extremity supported, Feet supported Sitting balance-Leahy Scale: Good Sitting balance - Comments: steady reaching within BOS   Standing balance support: Bilateral upper extremity supported, During functional activity, Reliant on assistive device for balance Standing balance-Leahy Scale: Good Standing balance comment: steady ambulating with RW use                            Communication Communication Communication: No apparent difficulties  Cognition Arousal: Alert Behavior During Therapy: [facility]  for tasks assessed/performed   PT - Cognitive impairments: No apparent impairments                         Following commands: Intact      Cueing Cueing Techniques: Verbal cues  Exercises      General Comments General comments (skin integrity, edema, etc.): no noted abdominal  incisions concerns.  Nursing cleared pt for participation in physical therapy.  Pt agreeable to PT session.      Pertinent Vitals/Pain Pain Assessment Pain Assessment: 0-10 Pain Score: 10-Worst pain ever Pain Location: abdomen Pain Descriptors / Indicators: Sore, Aching, Grimacing, Guarding, Moaning Pain Intervention(s): Limited activity within patient's tolerance, Monitored during session, Premedicated before session, Repositioned, Other (comment) (RN notified of pt's pain) HR 90-98 bpm and SpO2 sats 95% or greater on room air pre-post ambulation.    Home Living          Prior Function            PT Goals (current goals can now be found in the care plan section) Acute Rehab PT Goals Patient Stated Goal: to have less pain PT Goal Formulation: With patient Time For Goal Achievement: 04/15/24 Potential to Achieve Goals: Good Progress towards PT goals: Progressing toward goals    Frequency    Min 3X/week      PT Plan      Co-evaluation              AM-PAC PT "6 Clicks" Mobility   Outcome Measure  Help needed turning from your back to your side while in a flat bed without using bedrails?: A Little Help needed moving from lying on your back to sitting on the side of a flat bed without using bedrails?: A Little Help needed moving to and from a bed to a chair (including a wheelchair)?: A Little Help needed standing up from a chair using your arms (e.g., wheelchair or bedside chair)?: A Little Help needed to walk in hospital room?: A Little Help needed climbing 3-5 steps with a railing? : A Little 6 Click Score: 18    End of Session Equipment Utilized During Treatment: Gait belt Activity Tolerance: Patient limited by pain Patient left: in bed;with call bell/phone within reach;with bed alarm set Nurse Communication: Mobility status;Precautions;Other (comment) (Pt's 10/10 pain status (nurse reports pt not due for pain medication yet)) PT Visit Diagnosis: Other  abnormalities of gait and mobility (R26.89);Difficulty in walking, not elsewhere classified (R26.2);Muscle weakness (generalized) (M62.81)     Time: 2440-1027 PT Time Calculation (min) (ACUTE ONLY): 25 min  Charges:    $Gait Training: 8-22 mins $Therapeutic Activity: 8-22 mins PT General Charges $$ ACUTE PT VISIT: 1 Visit                     Amador Junes, PT 04/02/24, 11:05 AM

## 2024-04-02 NOTE — Evaluation (Signed)
 Occupational Therapy Evaluation Patient Details Name: Belinda Oneill MRN: [id number] DOB: Aug 03, 1961 Today's Date: 04/02/2024   History of Present Illness   Pt is a 10 female s/p laparoscopic R radical nephrectomy, R ureteral stent removal. Workup for UTI as well. PMH of HTN, kidney stones.     Clinical Impressions Patient presenting with decreased Ind in self care,balance, functional mobility/transfers, endurance, and safety awareness. Patient reports being Ind at baseline and living with family. She was using call bell to ask for assistance to bathroom when therapist enters the room.  Pt declines use of RW. Bed mobility performed with supervision to EOB and pt ambulates 15' with CGA for balance. Pt endorses "24/10 " pain at surgical site. Pt able to manage clothing and perform transfer with min guard. Standing hand hygiene with close supervision and pt returns to bed in same manner. Use of incentive spirometer with min cues for technique and pt returning to bed with assistance for B LEs to decrease pain with efforts. Patient will benefit from acute OT to increase overall independence in the areas of ADLs, functional mobility, and safety awareness in order to safely discharge.     If plan is discharge home, recommend the following:   A little help with walking and/or transfers;A little help with bathing/dressing/bathroom;Assistance with cooking/housework;Assist for transportation     Functional Status Assessment   Patient has had a recent decline in their functional status and demonstrates the ability to make significant improvements in function in a reasonable and predictable amount of time.     Equipment Recommendations   BSC/3in1      Precautions/Restrictions   Precautions Precautions: Fall Recall of Precautions/Restrictions: Intact     Mobility Bed Mobility Overal bed mobility: Needs Assistance Bed Mobility: Supine to Sit, Sit to Supine     Supine to sit:  Supervision Sit to supine: Min assist        Transfers Overall transfer level: Needs assistance Equipment used: None, 1 person hand held assist Transfers: Sit to/from Stand Sit to Stand: Contact guard assist                  Balance Overall balance assessment: Needs assistance Sitting-balance support: Feet supported Sitting balance-Leahy Scale: Good     Standing balance support: Single extremity supported, During functional activity Standing balance-Leahy Scale: Fair                             ADL either performed or assessed with clinical judgement   ADL Overall ADL's : Needs assistance/impaired     Grooming: Standing;Wash/dry hands;Supervision/safety                   Toilet Transfer: Contact guard assist;Grab bars;Ambulation   Toileting- Clothing Manipulation and Hygiene: Contact guard assist;Sit to/from stand       Functional mobility during ADLs: Supervision/safety;Contact guard assist       Vision Patient Visual Report: No change from baseline              Pertinent Vitals/Pain Pain Assessment Pain Assessment: 0-10 Pain Score: 10-Worst pain ever Pain Location: abdomen/back Pain Descriptors / Indicators: Spasm, Sore, Aching, Grimacing, Guarding, Moaning Pain Intervention(s): Limited activity within patient's tolerance, Monitored during session, Premedicated before session, Repositioned     Extremity/Trunk Assessment Upper Extremity Assessment Upper Extremity Assessment: Overall WFL for tasks assessed   Lower Extremity Assessment Lower Extremity Assessment: Generalized weakness       Communication  Communication Communication: No apparent difficulties   Cognition Arousal: Alert Behavior During Therapy: WFL for tasks assessed/performed Cognition: No apparent impairments                               Following commands: Intact       Cueing  General Comments   Cueing Techniques: Verbal cues;Gestural  cues;Tactile cues              Home Living Family/patient expects to be discharged to:: Private residence Living Arrangements: Children;Parent Available Help at Discharge: Family Type of Home: House Home Access: Stairs to enter Secretary/administrator of Steps: 3   Home Layout: One level               Home Equipment: None          Prior Functioning/Environment Prior Level of Function : Independent/Modified Independent                    OT Problem List: Decreased strength;Decreased safety awareness;Decreased activity tolerance;Decreased knowledge of use of DME or AE;Decreased knowledge of precautions   OT Treatment/Interventions: Self-care/ADL training;Therapeutic exercise;Therapeutic activities;Energy conservation;Patient/family education;Balance training;DME and/or AE instruction      OT Goals(Current goals can be found in the care plan section)   Acute Rehab OT Goals Patient Stated Goal: to decrease pain OT Goal Formulation: With patient Time For Goal Achievement: 04/16/24 Potential to Achieve Goals: Fair ADL Goals Pt Will Perform Grooming: with modified independence;standing Pt Will Perform Lower Body Dressing: with modified independence;sit to/from stand Pt Will Transfer to Toilet: with modified independence;ambulating Pt Will Perform Toileting - Clothing Manipulation and hygiene: with modified independence;sit to/from stand   OT Frequency:  Min 2X/week       AM-PAC OT "6 Clicks" Daily Activity     Outcome Measure Help from another person eating meals?: None Help from another person taking care of personal grooming?: None Help from another person toileting, which includes using toliet, bedpan, or urinal?: A Little Help from another person bathing (including washing, rinsing, drying)?: A Little Help from another person to put on and taking off regular upper body clothing?: None Help from another person to put on and taking off regular lower  body clothing?: A Little 6 Click Score: 21   End of Session Nurse Communication: Mobility status  Activity Tolerance: Patient tolerated treatment well Patient left: in bed;with call bell/phone within reach;with bed alarm set  OT Visit Diagnosis: Unsteadiness on feet (R26.81);Muscle weakness (generalized) (M62.81)                Time: 1610-9604 OT Time Calculation (min): 17 min Charges:  OT General Charges $OT Visit: 1 Visit OT Evaluation $OT Eval Low Complexity: 1 Low OT Treatments $Self Care/Home Management : 8-22 mins  George Kinder, MS, OTR/L , CBIS ascom [phone]  04/02/24, 9:53 AM

## 2024-04-03 MED ORDER — ACETAMINOPHEN 500 MG PO TABS
1000.0000 mg | ORAL_TABLET | Freq: Three times a day (TID) | ORAL | Status: DC
  Administered 2024-04-03 – 2024-04-05 (×6): 1000 mg via ORAL
  Filled 2024-04-03 (×7): qty 2

## 2024-04-03 NOTE — Progress Notes (Signed)
 Urology Inpatient Progress Note  Subjective: No acute events overnight. She is afebrile, VSS.  +OOB. Tolerating PO She has been working with PT and OT and using her incentive spirometer.   Anti-infectives: Anti-infectives (From admission, onward)    Start     Dose/Rate Route Frequency Ordered Stop   03/29/24 1600  ceFAZolin  (ANCEF ) IVPB 1 g/50 mL premix        1 g 100 mL/hr over 30 Minutes Intravenous Every 8 hours 03/29/24 1224 03/29/24 2341   03/29/24 0612  ceFAZolin  (ANCEF ) IVPB 2g/100 mL premix        2 g 200 mL/hr over 30 Minutes Intravenous 30 min pre-op 03/29/24 0612 03/29/24 0830       Current Facility-Administered Medications  Medication Dose Route Frequency Provider Last Rate Last Admin   acetaminophen  (TYLENOL ) tablet 1,000 mg  1,000 mg Oral Q8H Benny Henrie L, MD   1,000 mg at 04/03/24 1356   calcium  carbonate (TUMS - dosed in mg elemental calcium ) chewable tablet 200 mg of elemental calcium   200 mg of elemental calcium  Oral TID PRN [name], MD   200 mg of elemental calcium  at 04/02/24 1555   diphenhydrAMINE  (BENADRYL ) injection 12.5 mg  12.5 mg Intravenous Q6H PRN [name], MD       Or   diphenhydrAMINE  (BENADRYL ) 12.5 MG/5ML elixir 12.5 mg  12.5 mg Oral Q6H PRN [name], MD       docusate sodium  (COLACE) capsule 100 mg  100 mg Oral BID [name], MD   100 mg at 04/03/24 4696   heparin  injection 5,000 Units  5,000 Units Subcutaneous Q8H [name], MD   5,000 Units at 04/03/24 1302   HYDROmorphone  (DILAUDID ) tablet 2 mg  2 mg Oral Q4H PRN Vaillancourt, Samantha, PA-C   2 mg at 04/01/24 1804   HYDROmorphone  (DILAUDID ) tablet 4 mg  4 mg Oral Q4H PRN Vaillancourt, Samantha, PA-C   4 mg at 04/03/24 1256   melatonin tablet 5 mg  5 mg Oral QHS PRN Duncan, Hazel V, MD   5 mg at 04/03/24 0012   ondansetron  (ZOFRAN ) injection 4 mg  4 mg Intravenous Q4H PRN [name], MD       Oral care mouth rinse  15 mL Mouth Rinse PRN [name],  MD       Objective: Vital signs in last 24 hours: Temp:  [98.1 F (36.7 C)-98.7 F (37.1 C)] 98.1 F (36.7 C) (06/07 0748) Pulse Rate:  [86-96] 86 (06/07 0748) Resp:  [18-20] 18 (06/07 0748) BP: (100-134)/(69-82) 105/69 (06/07 0748) SpO2:  [94 %-95 %] 95 % (06/07 0748)  Intake/Output from previous day: No intake/output data recorded. Intake/Output this shift: No intake/output data recorded.  Physical Exam Vitals and nursing note reviewed.  Constitutional:      General: She is not in acute distress.    Appearance: She is not ill-appearing, toxic-appearing or diaphoretic.  HENT:     Head: Normocephalic and atraumatic.  Pulmonary:     Effort: Pulmonary effort is normal. No respiratory distress.  Skin:    General: Skin is warm and dry.  Neurological:     Mental Status: She is alert and oriented to person, place, and time.  Psychiatric:        Mood and Affect: Mood normal.        Behavior: Behavior normal.    Lab Results:  Recent Labs    04/01/24 0434  WBC 9.2  HGB 10.0*  HCT 30.4*  PLT 290  BMET Recent Labs    04/01/24 0434  NA 137  K 3.6  CL 102  CO2 26  GLUCOSE 101*  BUN 11  CREATININE 0.72  CALCIUM  9.2   Studies/Results: No results found.  Assessment & Plan: 63 y.o. female POD 5 from hand-assisted laparoscopic right radical nephrectomy and right ureteral stent removal with Dr. Ace Holder for management of chronic XGP kidney.  She continues to recover well from her procedure.  Pain control continues to be an area for improvement as well as general readiness to return home.   Recommendations: - Continue incentive spirometry - Continue working with PT OT - Pain control (oral only)  Mallie Seal, MD 04/03/2024

## 2024-04-03 NOTE — Plan of Care (Signed)

## 2024-04-04 MED ORDER — ENSURE PLUS HIGH PROTEIN PO LIQD
237.0000 mL | Freq: Two times a day (BID) | ORAL | Status: DC
  Administered 2024-04-05 (×2): 237 mL via ORAL

## 2024-04-04 NOTE — Plan of Care (Signed)

## 2024-04-04 NOTE — Progress Notes (Signed)
 Urology Inpatient Progress Note  Subjective: No acute events overnight. She is afebrile, VSS.  +OOB. Tolerating PO. Pain a little better today   Anti-infectives: Anti-infectives (From admission, onward)    Start     Dose/Rate Route Frequency Ordered Stop   03/29/24 1600  ceFAZolin  (ANCEF ) IVPB 1 g/50 mL premix        1 g 100 mL/hr over 30 Minutes Intravenous Every 8 hours 03/29/24 1224 03/29/24 2341   03/29/24 0612  ceFAZolin  (ANCEF ) IVPB 2g/100 mL premix        2 g 200 mL/hr over 30 Minutes Intravenous 30 min pre-op 03/29/24 0612 03/29/24 0830       Current Facility-Administered Medications  Medication Dose Route Frequency Provider Last Rate Last Admin   acetaminophen  (TYLENOL ) tablet 1,000 mg  1,000 mg Oral Q8H Gertrude Tarbet L, MD   1,000 mg at 04/04/24 0535   calcium  carbonate (TUMS - dosed in mg elemental calcium ) chewable tablet 200 mg of elemental calcium   200 mg of elemental calcium  Oral TID PRN [name], MD   200 mg of elemental calcium  at 04/02/24 1555   diphenhydrAMINE  (BENADRYL ) injection 12.5 mg  12.5 mg Intravenous Q6H PRN [name], MD       Or   diphenhydrAMINE  (BENADRYL ) 12.5 MG/5ML elixir 12.5 mg  12.5 mg Oral Q6H PRN [name], MD       docusate sodium  (COLACE) capsule 100 mg  100 mg Oral BID [name], MD   100 mg at 04/03/24 2022   heparin  injection 5,000 Units  5,000 Units Subcutaneous Q8H [name], MD   5,000 Units at 04/04/24 0534   HYDROmorphone  (DILAUDID ) tablet 2 mg  2 mg Oral Q4H PRN Vaillancourt, Samantha, PA-C   2 mg at 04/01/24 1804   HYDROmorphone  (DILAUDID ) tablet 4 mg  4 mg Oral Q4H PRN Vaillancourt, Samantha, PA-C   4 mg at 04/04/24 0325   melatonin tablet 5 mg  5 mg Oral QHS PRN Lanetta Pion, MD   5 mg at 04/03/24 2022   ondansetron  (ZOFRAN ) injection 4 mg  4 mg Intravenous Q4H PRN [name], MD       Oral care mouth rinse  15 mL Mouth Rinse PRN [name], MD       Objective: Vital signs in last 24  hours: Temp:  [98 F (36.7 C)-98.8 F (37.1 C)] 98 F (36.7 C) (06/08 0758) Pulse Rate:  [84-94] 88 (06/08 0758) Resp:  [16-18] 18 (06/08 0758) BP: (99-122)/(67-76) 99/67 (06/08 0758) SpO2:  [96 %-97 %] 96 % (06/08 0758)  Intake/Output from previous day: 06/07 0701 - 06/08 0700 In: 240 [P.O.:240] Out: -  Intake/Output this shift: No intake/output data recorded.  Physical Exam Vitals and nursing note reviewed.  Constitutional:      General: She is not in acute distress.    Appearance: She is not ill-appearing, toxic-appearing or diaphoretic.  HENT:     Head: Normocephalic and atraumatic.  Pulmonary:     Effort: Pulmonary effort is normal. No respiratory distress.  Skin:    General: Skin is warm and dry.  Neurological:     Mental Status: She is alert and oriented to person, place, and time.  Psychiatric:        Mood and Affect: Mood normal.        Behavior: Behavior normal.    Lab Results:  No results for input(s): "WBC", "HGB", "HCT", "PLT" in the last 72 hours.  BMET No results for input(s): "NA", "  K", "CL", "CO2", "GLUCOSE", "BUN", "CREATININE", "CALCIUM " in the last 72 hours.  Studies/Results: No results found.  Assessment & Plan: 63 y.o. female POD 6 from hand-assisted laparoscopic right radical nephrectomy and right ureteral stent removal with Dr. Ace Holder for management of chronic XGP kidney.  She continues to recover well from her procedure.  Pain control continues to be an area for improvement as well as general readiness to return home. Patient tentatively ok with plan to d/c home tomorrow   Recommendations: - Continue incentive spirometry - Continue working with PT OT - Pain control (oral only)  Mallie Seal, MD 04/04/2024

## 2024-04-04 NOTE — Plan of Care (Signed)
  Problem: Education: Goal: Knowledge of General Education information will improve Description: Including pain rating scale, medication(s)/side effects and non-pharmacologic comfort measures Outcome: Progressing   Problem: Health Behavior/Discharge Planning: Goal: Ability to manage health-related needs will improve Outcome: Progressing   Problem: Clinical Measurements: Goal: Ability to maintain clinical measurements within normal limits will improve Outcome: Progressing Goal: Will remain free from infection Outcome: Progressing Goal: Diagnostic test results will improve Outcome: Progressing Goal: Respiratory complications will improve Outcome: Progressing Goal: Cardiovascular complication will be avoided Outcome: Progressing   Problem: Activity: Goal: Risk for activity intolerance will decrease Outcome: Progressing   Problem: Nutrition: Goal: Adequate nutrition will be maintained Outcome: Progressing   Problem: Coping: Goal: Level of anxiety will decrease Outcome: Progressing   Problem: Elimination: Goal: Will not experience complications related to bowel motility Outcome: Progressing Goal: Will not experience complications related to urinary retention Outcome: Progressing   Problem: Pain Managment: Goal: General experience of comfort will improve and/or be controlled Outcome: Progressing   Problem: Safety: Goal: Ability to remain free from injury will improve Outcome: Progressing   Problem: Skin Integrity: Goal: Risk for impaired skin integrity will decrease Outcome: Progressing   Problem: Education: Goal: Knowledge of the prescribed therapeutic regimen will improve Outcome: Progressing   Problem: Bowel/Gastric: Goal: Gastrointestinal status for postoperative course will improve Outcome: Progressing   Problem: Clinical Measurements: Goal: Postoperative complications will be avoided or minimized Outcome: Progressing   Problem: Respiratory: Goal:  Ability to achieve and maintain a regular respiratory rate will improve Outcome: Progressing   Problem: Skin Integrity: Goal: Demonstration of wound healing without infection will improve Outcome: Progressing   Problem: Urinary Elimination: Goal: Ability to avoid or minimize complications of infection will improve Outcome: Progressing Goal: Ability to achieve and maintain urine output will improve Outcome: Progressing

## 2024-04-05 ENCOUNTER — Other Ambulatory Visit: Payer: Self-pay

## 2024-04-05 MED ORDER — HYDROMORPHONE HCL 2 MG PO TABS
2.0000 mg | ORAL_TABLET | Freq: Four times a day (QID) | ORAL | 0 refills | Status: DC | PRN
  Filled 2024-04-05: qty 12, 2d supply, fill #0

## 2024-04-05 NOTE — Discharge Instructions (Signed)
 [facility] [place] [phone]

## 2024-04-05 NOTE — TOC Transition Note (Addendum)
 Transition of Care [facility]) - Discharge Note   Patient Details  Name: Belinda Oneill MRN: [id number] Date of Birth: 03/17/1961  [facility] CM/SW Contact:  Loman Risk, RN Phone Number: 04/05/2024, 11:46 AM   Clinical Narrative:     Patient to dc today Patient states that her sister in law will be transporting at discharge PA ordered RW.  Patient in agreement and delivered by Sam Creighton with [facility] recommending home health.  Patient declines at this time.    Patient aware that list of local PCP added to AVS       Patient Goals and CMS Choice            Discharge Placement                       Discharge Plan and Services Additional resources added to the After Visit Summary for                                       Social Drivers of Health (SDOH) Interventions SDOH Screenings   Food Insecurity: No Food Insecurity (03/29/2024)  Housing: Low Risk  (03/29/2024)  Recent Concern: Housing - High Risk (02/09/2024)  Transportation Needs: No Transportation Needs (03/29/2024)  Recent Concern: Transportation Needs - Unmet Transportation Needs (02/09/2024)  Utilities: Not At Risk (03/29/2024)  Social Connections: Unknown (01/14/2024)  Tobacco Use: High Risk (03/29/2024)     Readmission Risk Interventions     No data to display

## 2024-04-05 NOTE — Discharge Summary (Signed)
 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   Intractable pain 02/12/2024   Perinephric abscess 02/12/2024   Ureteral stent present 02/12/2024   Sepsis (HCC) 02/07/2024   Obstructive uropathy 02/04/2024   Complicated UTI (urinary tract infection) 02/04/2024   Pyelonephritis 01/26/2024   Thrombocytosis 01/26/2024   Hydronephrosis of left kidney 01/26/2024   Back pain 01/26/2024   Reactive thrombocytosis 01/15/2024   Acute unilateral obstructive uropathy 01/14/2024   GERD without esophagitis 01/14/2024   Elevated blood pressure reading 01/14/2024   Tobacco abuse 01/14/2024   Acute lower UTI 01/14/2024   History and Physical: For full details, please see admission history and physical. Briefly, Belinda Oneill is a 63 y.o. year old patient admitted on 03/29/2024 for scheduled hand-assisted laparoscopic right radical nephrectomy with right ureteral stent removal with Dr. Ace Holder for management of a chronic XGP kidney.   She continues to recover well this morning.  She continues to ambulate and use her incentive spirometer.  She is feeling more confident about returning home.  Physical Exam: Constitutional:  Alert and oriented, no acute distress, nontoxic appearing HEENT: [place], AT Cardiovascular: No clubbing, cyanosis, or edema Respiratory: Normal respiratory effort, no increased work of breathing Skin: No rashes, bruises or suspicious lesions Neurologic: Grossly intact, no focal deficits, moving all 4 extremities Psychiatric: Normal mood and affect   Hospital Course: Patient tolerated the procedure well.  She was then transferred to the floor after an uneventful PACU stay.  Her hospital course was complicated by shortness of breath, tachycardia, and tachypnea,  for which hospitalists were consulted.  Workup was negative for PE and her breathing status and vitals improved with pain control, ambulation, and incentive spirometry.  On POD#7 she had met discharge criteria: was eating a regular diet, was up and ambulating independently,  pain was well controlled, was voiding without a catheter, and was ready for discharge.  Laboratory values:  Results for orders placed or performed during the hospital encounter of 03/25/24  Urine Culture     Status: Abnormal   Collection Time: 03/25/24  9:22 AM   Specimen: Urine, Clean Catch  Result Value Ref Range Status   Specimen Description   Final    URINE, CLEAN CATCH Performed at [facility], [address] [place], [place] [address]    Special Requests   Final    NONE Performed at [facility], [address] [place], [place] [address]    Culture (A)  Final    <10,000 COLONIES/mL INSIGNIFICANT GROWTH Performed at [facility], [address] [place], [place] [address]    Report Status 03/26/2024 FINAL  Final   Disposition: Home  Discharge instruction: The patient was instructed to be ambulatory but told to refrain from heavy lifting, strenuous activity, or driving. 2-wheel rolling walker ordered per PT recommendations.  Discharge medications:  Allergies as of 04/05/2024       Reactions   Levaquin  [levofloxacin ] Other (See Comments)   Moderate to severe joint pain/muscle pain while on the medication.        Medication List     STOP taking these medications    Gemtesa  75 MG Tabs Generic drug: Vibegron    levofloxacin  750 MG tablet Commonly known as: LEVAQUIN    oxybutynin  5 MG tablet Commonly known as: DITROPAN   tamsulosin  0.4 MG Caps capsule Commonly known as: FLOMAX        TAKE these medications    acetaminophen  500 MG tablet Commonly known as: TYLENOL  Take 2 tablets (1,000 mg total) by mouth every 6 (six) hours as needed.   ferrous sulfate  325 (65  FE) MG tablet Take 325 mg by mouth in the morning.   HYDROmorphone  2 MG tablet Commonly known as: DILAUDID  Take 1-2 tablets (2-4 mg total) by mouth every 6 (six) hours as needed for moderate pain (pain score 4-6) or severe pain (pain score 7-10).   [name] 1000-200-40 MG Chew Generic drug: Cal Carb-Mag Hydrox-Simeth Chew 1 tablet by mouth 3 (three) times daily as needed (indigestion/heartburn.). Rolaids Fast Relief of Occasional Heartburn and Acid Indigestion Smooth [place] Power Gummies   senna-docusate 8.6-50 MG tablet Commonly known as: Senokot-S Take 1 tablet by mouth 2 (two) times daily as needed for mild constipation.               Durable Medical Equipment  (From admission, onward)           Start     Ordered   04/05/24 0945  For home use only DME Walker rolling  Once       Question Answer Comment  Walker: With 5 Inch Wheels   Patient needs a walker to treat with the following condition Weakness      04/05/24 0945            Followup:   Follow-up Information     Stoioff, Kizzie Perks, MD Follow up in 4 week(s).   Specialty: Urology Why: For postop follow-up Contact information: [address] [place] [address] [phone]

## 2024-04-05 NOTE — Progress Notes (Addendum)
 Physical Therapy Treatment Patient Details Name: Belinda Oneill MRN: [id number] DOB: 10/04/1961 Today's Date: 04/05/2024   History of Present Illness Pt is a 46 female s/p laparoscopic R radical nephrectomy, R ureteral stent removal. Workup for UTI as well. PMH of HTN, kidney stones.    PT Comments  Pt was supine in bed upon entry, agreeable to therapy and very pleasant. Pt stated a decrease in pain compared to last session prior to ambulation. Pt was able to perform bed mobility and transfers (2 STS, 1 stand to sit) with SBA. Pt was able to perform stairs with RUE on rails and CGA for increased safety. pt ambulated a total of ~245 ft in 2 bouts (110 ft to stairwell SBA no AD used, 45 ft SBA towards room no AD used, 90 ft to room with RW due to increased exertion and fatigue). During ambulation trials, gait speed and activity endurance without an AD improved from previous session. After attempting stairs, pt demonstrated increased fatigue requiring to utilize a RW to end session. Pt will benefit from skilled PT to continue to work towards PLOF, upon D/C. Pt was left in bed with necessities within reach in the presence of family.    If plan is discharge home, recommend the following: A little help with walking and/or transfers;Assistance with cooking/housework;Assist for transportation;Help with stairs or ramp for entrance   Can travel by private vehicle        Equipment Recommendations  Rolling walker (2 wheels)    Recommendations for Other Services OT consult     Precautions / Restrictions Precautions Precautions: Fall Recall of Precautions/Restrictions: Intact Restrictions Weight Bearing Restrictions Per Provider Order: No     Mobility  Bed Mobility Overal bed mobility: Modified Independent Bed Mobility: Supine to Sit, Sit to Supine     Supine to sit: HOB elevated, Modified independent (Device/Increase time) Sit to supine: Modified independent (Device/Increase time)   General  bed mobility comments: Pt able to complete all bed mobility utilizing handrails    Transfers Overall transfer level: Modified independent Equipment used: None Transfers: Sit to/from Stand Sit to Stand: Modified independent (Device/Increase time)           General transfer comment: Pt utlizing bed and resting on counter before ambulation bout. Requested to change into her regular clothes, able to perform  independently in the bathroom    Ambulation/Gait Ambulation/Gait assistance: Supervision, Modified independent (Device/Increase time)   Assistive device: Rolling walker (2 wheels) Gait Pattern/deviations: Step-through pattern, WFL(Within Functional Limits) Gait velocity: decreased     General Gait Details: Pt ambulated 15 feet towards to bathroom with SBA no AD used, Pt ambulated 19ft SBA towards the stairs no AD used, Pt ambulated 130 feet in two bouts requiring a standing rest break after 45 feet before ambulating the rest of the way with RW and SBA. Pt demonstrated an increased gait speed with the RW.   Stairs Stairs: Yes Stairs assistance: Contact guard assist Stair Management: One rail Right Number of Stairs: 6 General stair comments: Descended with a step to pattern, ascended with step through pattern   Wheelchair Mobility     Tilt Bed    Modified Rankin (Stroke Patients Only)       Balance Overall balance assessment: Modified Independent Sitting-balance support: Single extremity supported, Feet supported Sitting balance-Leahy Scale: Good Sitting balance - Comments: static sitting balance at EOB   Standing balance support: Bilateral upper extremity supported, During functional activity Standing balance-Leahy Scale: Good Standing balance comment: With  fatigue requires BUE on RW for continued support                            Communication Communication Communication: No apparent difficulties  Cognition Arousal: Alert Behavior During  Therapy: WFL for tasks assessed/performed   PT - Cognitive impairments: No apparent impairments                         Following commands: Intact      Cueing Cueing Techniques: Verbal cues  Exercises      General Comments General comments (skin integrity, edema, etc.): SpO2: 95+% on room air, HR: 90 bpm at the end of session.      Pertinent Vitals/Pain Pain Assessment Pain Assessment: 0-10 Pain Score: 8  (15/10 at end of session 10:40 am) Pain Location: abdomen Pain Descriptors / Indicators: Sore, Aching, Grimacing, Guarding, Moaning Pain Intervention(s): Monitored during session, Premedicated before session, Repositioned (Nurse notified of increase of pain)    Home Living                          Prior Function            PT Goals (current goals can now be found in the care plan section) Acute Rehab PT Goals Patient Stated Goal: to have less pain PT Goal Formulation: With patient Time For Goal Achievement: 04/15/24 Potential to Achieve Goals: Good    Frequency    Min 3X/week      PT Plan      Co-evaluation              AM-PAC PT "6 Clicks" Mobility   Outcome Measure  Help needed turning from your back to your side while in a flat bed without using bedrails?: None Help needed moving from lying on your back to sitting on the side of a flat bed without using bedrails?: None Help needed moving to and from a bed to a chair (including a wheelchair)?: None Help needed standing up from a chair using your arms (e.g., wheelchair or bedside chair)?: None Help needed to walk in hospital room?: A Little Help needed climbing 3-5 steps with a railing? : A Little 6 Click Score: 22    End of Session Equipment Utilized During Treatment: Gait belt Activity Tolerance: Patient tolerated treatment well Patient left: in bed;with call bell/phone within reach;with family/visitor present (Seated EOB with BUE on bed and BLE supported on ground) Nurse  Communication: Mobility status;Other (comment) (Pain increase during ambulation) PT Visit Diagnosis: Other abnormalities of gait and mobility (R26.89);Difficulty in walking, not elsewhere classified (R26.2);Muscle weakness (generalized) (M62.81)     Time: 9811-9147    Charges:                           Hetty Loth, SPT   Tian Mcmurtrey 04/05/2024, 1:00 PM

## 2024-04-09 ENCOUNTER — Telehealth: Payer: Self-pay

## 2024-04-09 ENCOUNTER — Other Ambulatory Visit: Payer: Self-pay

## 2024-04-09 ENCOUNTER — Emergency Department

## 2024-04-09 ENCOUNTER — Emergency Department
Admission: EM | Admit: 2024-04-09 | Discharge: 2024-04-09 | Disposition: A | Discharge status: Home / Self Care | Attending: Emergency Medicine | Admitting: Emergency Medicine

## 2024-04-09 DIAGNOSIS — I10 Essential (primary) hypertension: Secondary | ICD-10-CM | POA: Insufficient documentation

## 2024-04-09 DIAGNOSIS — R109 Unspecified abdominal pain: Secondary | ICD-10-CM | POA: Diagnosis present

## 2024-04-09 DIAGNOSIS — G8918 Other acute postprocedural pain: Secondary | ICD-10-CM | POA: Diagnosis not present

## 2024-04-09 LAB — COMPREHENSIVE METABOLIC PANEL WITH GFR
ALT: 26 U/L (ref 0–44)
AST: 27 U/L (ref 15–41)
Albumin: 3.5 g/dL (ref 3.5–5.0)
Alkaline Phosphatase: 95 U/L (ref 38–126)
Anion gap: 11 (ref 5–15)
BUN: 21 mg/dL (ref 8–23)
CO2: 26 mmol/L (ref 22–32)
Calcium: 9.8 mg/dL (ref 8.9–10.3)
Chloride: 99 mmol/L (ref 98–111)
Creatinine, Ser: 0.89 mg/dL (ref 0.44–1.00)
GFR, Estimated: >60 mL/min (ref >60)
Glucose, Bld: 107 mg/dL — ABNORMAL HIGH (ref 70–99)
Potassium: 4.4 mmol/L (ref 3.5–5.1)
Sodium: 136 mmol/L (ref 135–145)
Total Bilirubin: 0.4 mg/dL (ref 0.0–1.2)
Total Protein: 8.4 g/dL — ABNORMAL HIGH (ref 6.5–8.1)

## 2024-04-09 LAB — CBC
HCT: 36.2 % (ref 36.0–46.0)
Hemoglobin: 11.7 g/dL — ABNORMAL LOW (ref 12.0–15.0)
MCH: 28.3 pg (ref 26.0–34.0)
MCHC: 32.3 g/dL (ref 30.0–36.0)
MCV: 87.7 fL (ref 80.0–100.0)
Platelets: 749 10*3/uL — ABNORMAL HIGH (ref 150–400)
RBC: 4.13 MIL/uL (ref 3.87–5.11)
RDW: 17.6 % — ABNORMAL HIGH (ref 11.5–15.5)
WBC: 5.4 10*3/uL (ref 4.0–10.5)
nRBC: 0 % (ref 0.0–0.2)

## 2024-04-09 LAB — LACTIC ACID, PLASMA
Lactic Acid, Venous: 1.4 mmol/L (ref 0.5–1.9)
Lactic Acid, Venous: 1.6 mmol/L (ref 0.5–1.9)

## 2024-04-09 MED ORDER — MORPHINE SULFATE (PF) 4 MG/ML IV SOLN
4.0000 mg | Freq: Once | INTRAVENOUS | Status: AC
  Filled 2024-04-09: qty 1

## 2024-04-09 MED ORDER — OXYCODONE-ACETAMINOPHEN 5-325 MG PO TABS: 1.0000 | ORAL_TABLET | ORAL | 0 refills | Status: DC | PRN

## 2024-04-09 MED ORDER — IOHEXOL 300 MG/ML  SOLN: 100.0000 mL | Freq: Once | INTRAMUSCULAR | Status: AC | PRN

## 2024-04-09 MED ORDER — SODIUM CHLORIDE 0.9 % IV BOLUS: 1000.0000 mL | Freq: Once | INTRAVENOUS | Status: AC

## 2024-04-09 MED ORDER — ONDANSETRON HCL 4 MG/2ML IJ SOLN
4.0000 mg | Freq: Once | INTRAMUSCULAR | Status: AC
  Filled 2024-04-09: qty 2

## 2024-04-09 NOTE — ED Provider Notes (Signed)
 [facility] Provider Note    Event Date/Time   First MD Initiated Contact with Patient 04/09/24 1711     (approximate)  History   Chief Complaint: Wound Check  HPI  Belinda Oneill is a 63 y.o. female with a past medical history of of gastric reflux, hypertension, anemia, presents to the emergency department for abdominal pain after recent nephrectomy.  According to the patient on 6/2 she had a right nephrectomy performed by Dr. Ace Holder.  Patient went home on 6/8.  Patient states at first her pain was well-controlled however over the last 2 to 3 days her pain has persistently worsened.  Patient is having pain in her right back as well as her lower abdomen in the area of her lower incision.  Patient states low-grade temperature at home around 100.0, afebrile in the emergency department.  Patient called urology and they recommended that the patient come to the emergency department for further evaluation.  Physical Exam   Triage Vital Signs: ED Triage Vitals  Encounter Vitals Group     BP 04/09/24 1646 103/77     Girls Systolic BP Percentile --      Girls Diastolic BP Percentile --      Boys Systolic BP Percentile --      Boys Diastolic BP Percentile --      Pulse Rate 04/09/24 1646 (!) 102     Resp 04/09/24 1646 16     Temp 04/09/24 1646 98.3 F (36.8 C)     Temp Source 04/09/24 1646 Oral     SpO2 04/09/24 1646 96 %     Weight 04/09/24 1647 124 lb (56.2 kg)     Height 04/09/24 1647 5' 4 (1.626 m)     Head Circumference --      Peak Flow --      Pain Score 04/09/24 1646 8     Pain Loc --      Pain Education --      Exclude from Growth Chart --     Most recent vital signs: Vitals:   04/09/24 1646  BP: 103/77  Pulse: (!) 102  Resp: 16  Temp: 98.3 F (36.8 C)  SpO2: 96%    General: Awake, no distress.  CV:  Good peripheral perfusion.  Regular rate and rhythm  Resp:  Normal effort.  Equal breath sounds bilaterally.  Abd:  No distention.   Soft.  No rebound or guarding.  Overall well-appearing incisions no dehiscence.  Patient does have tenderness to palpation across the lower incision in the suprapubic region where there is some mild fullness possible seroma, less likely abscess  ED Results / Procedures / Treatments   RADIOLOGY  I have reviewed interpreted CT images.  No significant findings seen on my evaluation. Radiology has read continued fluid collection within the area of the right nephrectomy and small amount of air within this cannot rule out superimposed infection.   MEDICATIONS ORDERED IN ED: Medications  morphine  (PF) 4 MG/ML injection 4 mg (has no administration in time range)  ondansetron  (ZOFRAN ) injection 4 mg (has no administration in time range)     IMPRESSION / MDM / ASSESSMENT AND PLAN / ED COURSE  I reviewed the triage vital signs and the nursing notes.  Patient's presentation is most consistent with acute presentation with potential threat to life or bodily function.  Patient presents emergency department for abdominal pain as well as right back pain.  Patient had a recent right-sided nephrectomy on  6/2.  Overall incisions appear well there is some mild fullness along the right side of the patient's lower incision likely seroma however cannot rule out abscess.  There is no skin color changes or drainage to suggest infection however.  Given the patient's increased pain approximately 10 days after a major surgery we will proceed with lab work we will control pain with morphine  and obtain a CT scan of the abdomen to further evaluate and help rule out postsurgical complications.  Patient agreeable to plan of care.  CT scan shows minimal findings there is a small amount of air within the fluid collection however they state is grossly unchanged since 6/4.  Cannot rule out infection.  Patient's lab work however is reassuring with a normal CBC with no white blood cell count elevation.  Normal lactic acid.   Reassuring chemistry.  I spoke to Dr. Cherylene Corrente of urology who does not believe that this reflects any acute issue or infection.  He states he will have the patient get into the office for follow-up we will discharge on a short course of pain medication.  Patient agreeable to plan of care.  FINAL CLINICAL IMPRESSION(S) / ED DIAGNOSES   Abdominal pain Postsurgical pain  Note:  This document was prepared using Dragon voice recognition software and may include unintentional dictation errors.   Ruth Cove, MD 04/09/24 2039

## 2024-04-09 NOTE — ED Notes (Signed)
 Per Dr. Azalee Bolds, no need to collect next lactic acid

## 2024-04-09 NOTE — ED Triage Notes (Signed)
 Had right nephrectomy on 03/29/2024, released from hospital on 6/8.  ARrives today c/o concerns of wound infection.  States incision not reddened, but feels warm and also c/o pain to area.    Describes that mother at home is 'driving her crazy.  AAOx3.  Skin warm and dry. NAD.  Appears anxious.

## 2024-04-09 NOTE — Telephone Encounter (Signed)
 Pt called the triage line at 2:34 pm. Pt states she is experiencing pain and burning on her incision sites, pt states sites are not red but can feel heat on them. Pt states she is experiencing shortness of breathe but states she is nervous about her pain and it might be due to that. Pt states she has no appetite. Symptoms started Wednesday. Confirmed with Sam that pt needs to go to the ER. Advise pt, pt voiced understanding.

## 2024-04-09 NOTE — Discharge Instructions (Signed)
 Please follow-up with Dr. Ace Holder on Monday call to make an appointment.  Please take your pain medication as needed but only as prescribed.  Do not drink alcohol or drive while taking this medication.

## 2024-04-09 NOTE — ED Notes (Signed)
 Patient transported to CT

## 2024-04-13 ENCOUNTER — Ambulatory Visit (INDEPENDENT_AMBULATORY_CARE_PROVIDER_SITE_OTHER): Admitting: Urology

## 2024-04-13 VITALS — BP 122/85 | HR 66 | Ht 64.0 in | Wt 126.0 lb

## 2024-04-13 DIAGNOSIS — G8918 Other acute postprocedural pain: Secondary | ICD-10-CM

## 2024-04-13 NOTE — Progress Notes (Signed)
 I,[name],acting as a scribe for [name], MD.,have documented all relevant documentation on the behalf of [name], MD,as directed by  [name], MD while in the presence of [name], MD.  04/13/2024 2:13 PM   Belinda Oneill Belinda Oneill October 05, 1961 [id number]   Chief Complaint  Patient presents with   Hydronephrosis    HPI: 63 year-old female who recently underwent a nephrectomy for an atrophic XGP kidney presents today for a follow up.  She was hospitalized for an extended period primarily for pain management and was discharged on June 9th. She returned to the ER on June 13th with complaints of ongoing abdominal pain. A CT scan was performed, which was stable compared to a previous scan from June 4th, showing a fluid collection with some air but no discrete room-enhancing lesions or abscesses. No other pathology was identified, and her labs were stable with no lactate or concern for infection at the time of discharge.   She reports ongoing abdominal pain, difficulty breathing, pressure, and shortness of breath. She experiences pain when breathing, laughing, or coughing, and has difficulty moving her arm without pain.   She also reports a lack of appetite, difficulty sleeping, and a concern about not feeling well and believes she should be feeling better by now. She is also concerned about anxiety contributing to her symptoms.  She has been taking ibuprofen for pain management.   She does not have a primary care provider and is waiting for Medicaid to set her up with one.   PMH: Past Medical History:  Diagnosis Date   Anemia    Aortic atherosclerosis (HCC)    Cardiomegaly    Cholelithiasis    Essential hypertension 01/14/2024   GERD (gastroesophageal reflux disease)    Kidney stones    Left upper lobe pulmonary nodule    Pulmonary emphysema (HCC)     Surgical History: Past Surgical History:  Procedure Laterality Date   CYSTOSCOPY WITH STENT PLACEMENT  Right 02/03/2024   Procedure: CYSTOSCOPY, WITH STENT INSERTION;  Surgeon: [name], MD;  Location: [facility] ORS;  Service: Urology;  Laterality: Right;   IR NEPHROSTOMY EXCHANGE RIGHT  01/28/2024   IR NEPHROSTOMY PLACEMENT RIGHT  01/15/2024   LAPAROSCOPIC NEPHRECTOMY Right 03/29/2024   Procedure: NEPHRECTOMY, RADICAL, LAPAROSCOPIC, ADULT;  Surgeon: [name], MD;  Location: [facility] ORS;  Service: Urology;  Laterality: Right;  Hand Assisted   LITHOTRIPSY      Home Medications:  Allergies as of 04/13/2024       Reactions   Levaquin  [levofloxacin ] Other (See Comments)   Moderate to severe joint pain/muscle pain while on the medication.        Medication List        Accurate as of April 13, 2024  2:13 PM. If you have any questions, ask your nurse or doctor.          acetaminophen  500 MG tablet Commonly known as: TYLENOL  Take 2 tablets (1,000 mg total) by mouth every 6 (six) hours as needed.   ferrous sulfate  325 (65 FE) MG tablet Take 325 mg by mouth in the morning.   HYDROmorphone  2 MG tablet Commonly known as: DILAUDID  Take 1-2 tablets (2-4 mg total) by mouth every 6 (six) hours as needed for moderate pain (pain score 4-6) or severe pain (pain score 7-10).   oxyCODONE -acetaminophen  5-325 MG tablet Commonly known as: Percocet Take 1 tablet by mouth every 4 (four) hours as needed for severe pain (pain score 7-10).   Rolaids  Advanced 1000-200-40 MG Chew Generic drug: Cal Carb-Mag Hydrox-Simeth Chew 1 tablet by mouth 3 (three) times daily as needed (indigestion/heartburn.). Rolaids Fast Relief of Occasional Heartburn and Acid Indigestion Smooth Berry Power Gummies   senna-docusate 8.6-50 MG tablet Commonly known as: Senokot-S Take 1 tablet by mouth 2 (two) times daily as needed for mild constipation.        Allergies:  Allergies  Allergen Reactions   Levaquin  [Levofloxacin ] Other (See Comments)    Moderate to severe joint pain/muscle pain while on the medication.     Family History: Family History  Problem Relation Age of Onset   Breast cancer Maternal Aunt     Social History:  reports that she has been smoking cigarettes. She has a 10 pack-year smoking history. She has never used smokeless tobacco. She reports current alcohol use. She reports that she does not currently use drugs after having used the following drugs: Marijuana.   Physical Exam: BP 122/85   Pulse 66   Ht 5' 4 (1.626 m)   Wt 126 lb (57.2 kg)   BMI 21.63 kg/m   Constitutional:  Alert and oriented, No acute distress. HEENT: [place] AT, moist mucus membranes.  Trachea midline, no masses. Neurologic: Grossly intact, no focal deficits, moving all 4 extremities. Psychiatric: Normal mood and affect. Skin: Surgical incisions healing well.   Pertinent Imaging: Narrative & Impression  CLINICAL DATA:  Abdominal pain, acute, nonlocalized abd pain, recent nephrectomy. Right nephrectomy on 03/29/2024.   EXAM: CT ABDOMEN AND PELVIS WITH CONTRAST   TECHNIQUE: Multidetector CT imaging of the abdomen and pelvis was performed using the standard protocol following bolus administration of intravenous contrast.   RADIATION DOSE REDUCTION: This exam was performed according to the departmental dose-optimization program which includes automated exposure control, adjustment of the mA and/or kV according to patient size and/or use of iterative reconstruction technique.   CONTRAST:  75mL OMNIPAQUE  IOHEXOL  300 MG/ML  SOLN   COMPARISON:  CT scan abdomen and pelvis from 02/07/2024.   FINDINGS: Lower chest: Redemonstration of segmental atelectatic changes in the right lung lower lobe, slightly improved since the prior study. There are additional patchy areas of atelectasis/scarring throughout bilateral imaged lungs. No mass, consolidation or pleural effusion. No pneumothorax. Normal heart size. No pericardial effusion. There are coronary artery atherosclerotic calcifications, in keeping  with coronary artery disease.   Hepatobiliary: The liver is normal in size. Non-cirrhotic configuration. No suspicious mass. No intrahepatic or extrahepatic bile duct dilation. No calcified gallstones. Normal gallbladder wall thickness. No pericholecystic inflammatory changes.   Pancreas: Unremarkable. No pancreatic ductal dilatation or surrounding inflammatory changes.   Spleen: Within normal limits. No focal lesion.   Adrenals/Urinary Tract: Adrenal glands are unremarkable. Patient is status post recent right nephrectomy. There is an approximately 0.1 x 2.5 x 11.6 cm (anteroposterior x transverse x craniocaudal) slightly heterogeneous fluid collection in the right nephrectomy bed. There are several foci of air within the collection. There is surgical suture along the anterior aspect of the collection. The collection appears grossly similar to the prior study from 03/31/2024.   Redemonstration of multiple (at least 8), 2-3 mm sized calculi in the left kidney. No hydroureteronephrosis or ureterolithiasis. There is a 7 x 9 mm calcification in the left kidney upper pole, posterolaterally, likely dystrophic cortical calcification.   Unremarkable urinary bladder. No focal mass or perivesical fat stranding.   Stomach/Bowel: No disproportionate dilation of the small or large bowel loops. No evidence of abnormal bowel wall thickening or inflammatory changes. The  appendix is unremarkable. There are scattered diverticula mainly in the sigmoid colon, without imaging signs of diverticulitis.   Vascular/Lymphatic: No ascites or pneumoperitoneum. No abdominal or pelvic lymphadenopathy, by size criteria. No aneurysmal dilation of the major abdominal arteries. There are moderate peripheral atherosclerotic vascular calcifications of the aorta and its major branches.   Reproductive: The uterus is unremarkable. No large adnexal mass.   Other: There is a tiny fat containing umbilical hernia.  Oblique right lower quadrant anterior abdominal wall surgical scar noted. The soft tissues and abdominal wall are otherwise unremarkable.   Musculoskeletal: No suspicious osseous lesions. There are mild multilevel degenerative changes in the visualized spine.   IMPRESSION: 1. Redemonstration of right nephrectomy bed fluid collection, which is grossly similar to the prior study from 03/31/2024. There are several residual foci of air within the collection, which may be related to surgery. However, superimposed infection cannot be excluded. 2. Multiple other nonacute observations, as described above.   Aortic Atherosclerosis (ICD10-I70.0).   Electronically Signed   By: Beula Brunswick M.D.   On: 04/09/2024 18:55   Narrative & Impression  CLINICAL DATA:  Pulmonary embolism (PE) suspected, high prob. Recent laparoscopic nephrectomy   EXAM: CT ANGIOGRAPHY CHEST WITH CONTRAST   TECHNIQUE: Multidetector CT imaging of the chest was performed using the standard protocol during bolus administration of intravenous contrast. Multiplanar CT image reconstructions and MIPs were obtained to evaluate the vascular anatomy.   RADIATION DOSE REDUCTION: This exam was performed according to the departmental dose-optimization program which includes automated exposure control, adjustment of the mA and/or kV according to patient size and/or use of iterative reconstruction technique.   CONTRAST:  75mL OMNIPAQUE  IOHEXOL  350 MG/ML SOLN   COMPARISON:  02/09/2024   FINDINGS: Cardiovascular: Heart size normal. No pericardial effusion. Satisfactory opacification of pulmonary arteries noted, and there is no evidence of pulmonary emboli. Adequate contrast opacification of the thoracic aorta with no evidence of dissection, aneurysm, or stenosis. There is bovine variant brachiocephalic arch anatomy without proximal stenosis. Minimal calcified plaque in the descending thoracic aorta.    Mediastinum/Nodes: No mediastinal hematoma, mass, or adenopathy.   Lungs/Pleura: Small right pleural effusion. Dependent consolidation/atelectasis in the lung bases, right worse than left.   Upper Abdomen: Free intraperitoneal gas, presumably postoperative. Gas and fluid in the visualized portion of the right renal fossa. 8 mm coarse peripheral calcification in the upper pole left kidney as before.   Musculoskeletal: Vertebral endplate spurring at multiple levels in the mid thoracic spine. Spondylitic changes in the visualized lower cervical spine.   Review of the MIP images confirms the above findings.   IMPRESSION: 1. Negative for acute PE or thoracic aortic dissection. 2. Small right pleural effusion with dependent consolidation/atelectasis in the lung bases, right worse than left. 3. Free intraperitoneal gas, presumably postoperative. 4.  Aortic Atherosclerosis (ICD10-I70.0).  For   Electronically Signed   By: Nicoletta Barrier M.D.   On: 03/31/2024 09:31  Personally reviewed the above images and agree with radiologic interpretation.    Assessment & Plan:    1. Postoperative Pain - Consistent with postoperative recovery following nephrectomy for a chronically inflamed and infected kidney. The pain is exacerbated by anxiety, which is contributing to her symptoms. Advised to continue using Ibuprofen for pain management and to engage in activities that promote recovery, such as ambulation and deep breathing exercises. - Anxiety may be contributing to the perception of pain and shortness of breath. Encouraged to establish care with a primary care provider for  ongoing management of anxiety. Non-pharmacological interventions, such as relaxation techniques and counseling, may be beneficial.  - Surgical incisions are healing well. -All labs and imaging thus far have all been reassuring and overall, she is improving but not a quickly as she had hoped  2. Shortness of Breath - The  shortness of breath is likely secondary to postoperative pain and anxiety. Pulmonary embolism and pneumonia have been ruled out.  Her pulse oximetry is normal at 98% on room air. She is encouraged to continue using the incentive spirometer, ambulate regularly, and work on improving her stamina to prevent atelectasis and promote lung expansion.  Return in about 5 weeks (around 05/18/2024) for reassessment of symptoms and recovery progress.  I have reviewed the above documentation for accuracy and completeness, and I agree with the above.   [name], MD   [facility] [address], [address] [place], [place] [address] [phone]

## 2024-04-21 ENCOUNTER — Telehealth: Payer: Self-pay

## 2024-04-21 NOTE — Telephone Encounter (Signed)
 Patient called stating she saw Dr [name] recently and has a follow up with Dr Twylla on 05/03/24. Patient would like to get guidenace and feedback on what to do in the meantime about her pain and discomfort in her right side and her right arm/shoulder that she has been dealing with since the surgery. She is still waiting to get in with PCP. When she lays on the right side to sleep or rest the constant pain resolves but when she stands up or lays on the left side the paid comes back. It is constant, she is having hard time sleeping. Lack of sleep and constant pain makes her irritable and she does not want to feel like that. She has been taking Tylenol  which does not help and is not able to take Ibuprofen. Is there anything else she can try to take or be prescribed to take?  Advised I would send message to Sam and Dr Twylla in Dr [name] absence to see if their something we can help her with.

## 2024-04-23 MED ORDER — MELOXICAM 15 MG PO TABS: 15.0000 mg | ORAL_TABLET | Freq: Every day | ORAL | 0 refills | Status: DC | PRN

## 2024-04-23 NOTE — Telephone Encounter (Signed)
 Spoke with patient and she states that it is her incision site on the right side that is constantly pulling and hurting and makes it uncomfortable and painful to rest or move around. Denies issue with her arm/shoulder.  Spoke with Dr Twylla and advised patient we will send Meloxicam 15 mg to take 1 tablet daily as needed #30 with 0 refill. RX sent to [facility]

## 2024-04-23 NOTE — Telephone Encounter (Signed)
 Pt Belinda Oneill on triage line stating she is still having RT sided pain, she states that she is attempting to get a ride to the ER as she can no longer tolerate the pain at home.

## 2024-04-23 NOTE — Telephone Encounter (Signed)
 Pt LM on triage line stating that she has not heard back from her previous calls.

## 2024-04-23 NOTE — Addendum Note (Signed)
 Addended by: GIRARD DON GAILS on: 04/23/2024 03:43 PM   Modules accepted: Orders

## 2024-04-23 NOTE — Telephone Encounter (Signed)
 Arm and shoulder pain would not be related to her surgery and will need PCP eval for this problem.  Dr. [name] felt her pain and CT findings were consistent with postoperative course and felt her pain was exacerbated by anxiety.  Dr. [name] did not prescribe narcotic analgesics at her postop follow-up 10 days ago and since she performed the surgery will rely on her assessment that narcotic analgesics are not needed

## 2024-05-03 ENCOUNTER — Ambulatory Visit (INDEPENDENT_AMBULATORY_CARE_PROVIDER_SITE_OTHER): Admitting: Urology

## 2024-05-03 ENCOUNTER — Other Ambulatory Visit: Payer: Self-pay | Admitting: Urology

## 2024-05-03 ENCOUNTER — Encounter: Payer: Self-pay | Admitting: Urology

## 2024-05-03 VITALS — BP 106/74 | HR 75 | Ht 64.0 in | Wt 122.8 lb

## 2024-05-03 DIAGNOSIS — R630 Anorexia: Secondary | ICD-10-CM

## 2024-05-03 DIAGNOSIS — R350 Frequency of micturition: Secondary | ICD-10-CM

## 2024-05-03 DIAGNOSIS — G8918 Other acute postprocedural pain: Secondary | ICD-10-CM

## 2024-05-03 LAB — URINALYSIS, COMPLETE
Bilirubin, UA: NEGATIVE
Glucose, UA: NEGATIVE
Ketones, UA: NEGATIVE
Nitrite, UA: NEGATIVE
Protein,UA: NEGATIVE
Specific Gravity, UA: 1.025 (ref 1.005–1.030)
Urobilinogen, Ur: 0.2 mg/dL (ref 0.2–1.0)
pH, UA: 6 (ref 5.0–7.5)

## 2024-05-03 LAB — MICROSCOPIC EXAMINATION: Epithelial Cells (non renal): >10 /HPF — AB (ref 0–10)

## 2024-05-03 MED ORDER — KETOROLAC TROMETHAMINE 10 MG PO TABS: 20.0000 mg | ORAL_TABLET | Freq: Four times a day (QID) | ORAL | 0 refills | Status: DC | PRN

## 2024-05-03 NOTE — Progress Notes (Signed)
 I, [name], acting as a scribe for [name], MD., have documented all relevant documentation on the behalf of [name], MD, as directed by [name], MD while in the presence of [name], MD.  05/03/2024 9:00 PM   Belinda Oneill 1960-11-18 [id number]   Chief Complaint  Patient presents with   Post-op Problem    HPI: Belinda Oneill is a 63 y.o. female presents for a post-op follow-up.  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  She was hospitalized for an extended period for pain management and discharged 04/05/24.  She complains of continued pain at her incision sites including port sites. She also complains of urinary frequency and urgency. Refer to Dr. [name] post-op note 04/13/24.  She called last week, complaining of persistent pain, and Rx meloxicam  was sent to her pharmacy. She states this did improve her symptoms, however she had to discontinue, secondary to GI upset. She had no significant pain improvement with Percocet, and Dilaudid  had been the only effective short-term pain medication. She has had approval for a primary care provider at [facility], but has not yet made an appointment. Dr [name] felt her anxiety was a significant factor in her pain management.   PMH: Past Medical History:  Diagnosis Date   Anemia    Aortic atherosclerosis (HCC)    Cardiomegaly    Cholelithiasis    Essential hypertension 01/14/2024   GERD (gastroesophageal reflux disease)    Kidney stones    Left upper lobe pulmonary nodule    Pulmonary emphysema (HCC)     Surgical History: Past Surgical History:  Procedure Laterality Date   CYSTOSCOPY WITH STENT PLACEMENT Right 02/03/2024   Procedure: CYSTOSCOPY, WITH STENT INSERTION;  Surgeon: [name], MD;  Location: [facility] ORS;  Service: Urology;  Laterality: Right;   IR NEPHROSTOMY EXCHANGE RIGHT  01/28/2024   IR NEPHROSTOMY PLACEMENT  RIGHT  01/15/2024   LAPAROSCOPIC NEPHRECTOMY Right 03/29/2024   Procedure: NEPHRECTOMY, RADICAL, LAPAROSCOPIC, ADULT;  Surgeon: [name], MD;  Location: [facility] OR[facility];  Service: Urology;  Laterality: Right;  Hand Assisted   LITHOTRIPSY      Home Medications:  Allergies as of 05/03/2024       Reactions   Levaquin  [levofloxacin ] Other (See Comments)   Moderate to severe joint pain/muscle pain while on the medication.        Medication List        Accurate as of May 03, 2024  9:00 PM. If you have any questions, ask your nurse or doctor.          STOP taking these medications    HYDROmorphone  2 MG tablet Commonly known as: DILAUDID  Stopped by: [name]   meloxicam  15 MG tablet Commonly known as: MOBIC  Stopped by: [name]   oxyCODONE -acetaminophen  5-325 MG tablet Commonly known as: Percocet Stopped by: [name]   senna-docusate 8.6-50 MG tablet Commonly known as: Senokot-S Stopped by: [name]       TAKE these medications    acetaminophen  500 MG tablet Commonly known as: TYLENOL  Take 2 tablets (1,000 mg total) by mouth every 6 (six) hours as needed.   ferrous sulfate  325 (65 FE) MG tablet Take 325 mg by mouth in the morning.   ketorolac  10 MG tablet Commonly known as: TORADOL  Take 1 tablet (10 mg total) by mouth every 6 (six) hours as needed. Started by: [name]  [name]   [name] 1000-200-40 MG Chew Generic drug: Cal Carb-Mag Hydrox-Simeth Chew 1 tablet by mouth 3 (three) times daily as needed (indigestion/heartburn.). Rolaids Fast Relief of Occasional Heartburn and Acid Indigestion Smooth Berry Power Gummies        Allergies:  Allergies  Allergen Reactions   Levaquin  [Levofloxacin ] Other (See Comments)    Moderate to severe joint pain/muscle pain while on the medication.    Family History: Family History  Problem Relation Age of Onset   Breast cancer Maternal Aunt     Social History:  reports that she has  been smoking cigarettes. She has a 10 pack-year smoking history. She has never used smokeless tobacco. She reports current alcohol use. She reports that she does not currently use drugs after having used the following drugs: Marijuana.   Physical Exam: BP 106/74   Pulse 75   Ht 5' 4 (1.626 m)   Wt 122 lb 12.8 oz (55.7 kg)   BMI 21.08 kg/m   Constitutional:  Alert and oriented, No acute distress. HEENT: [place] AT Respiratory: Normal respiratory effort, no increased work of breathing. GI: Abdomen soft, non-distended. Tenderness and guarding with minimal pressure at incision sites, unable to perform deep palpation. Psychiatric: Normal mood and affect.   Assessment & Plan:    1. Status post right nephrectomy Complains of pain at incision sites; exam reveals guarding and tenderness with minimal pressure, but pain reaction is out of proportion to pressure applied. Trial of Ketorolac  10 mg Q6H PRN prescribed, sent to pharmacy.  2. Urinary frequency/urgency Urinalysis ordered to evaluate for potential urinary tract infection or other causes.  3. Anorexia Persistent loss of appetite since surgery. Recommend scheduling a primary care appointment for further evaluation and management.  I have reviewed the above documentation for accuracy and completeness, and I agree with the above.   [name], MD  [facility] [address] [place], [place] [address] [phone]

## 2024-05-05 ENCOUNTER — Encounter: Payer: Self-pay | Admitting: Urology

## 2024-05-06 ENCOUNTER — Ambulatory Visit: Payer: Self-pay | Admitting: Urology

## 2024-05-06 LAB — CULTURE, URINE COMPREHENSIVE

## 2024-07-07 ENCOUNTER — Encounter: Payer: Self-pay | Admitting: Pediatrics

## 2024-07-15 ENCOUNTER — Other Ambulatory Visit: Payer: Self-pay | Admitting: Pediatrics

## 2024-07-15 DIAGNOSIS — Z1231 Encounter for screening mammogram for malignant neoplasm of breast: Secondary | ICD-10-CM

## 2024-07-15 DIAGNOSIS — N63 Unspecified lump in unspecified breast: Secondary | ICD-10-CM

## 2024-07-15 DIAGNOSIS — R921 Mammographic calcification found on diagnostic imaging of breast: Secondary | ICD-10-CM

## 2024-07-15 DIAGNOSIS — N6489 Other specified disorders of breast: Secondary | ICD-10-CM

## 2024-07-21 ENCOUNTER — Other Ambulatory Visit

## 2024-07-21 ENCOUNTER — Encounter

## 2024-07-22 ENCOUNTER — Other Ambulatory Visit

## 2024-07-22 ENCOUNTER — Encounter

## 2024-07-28 ENCOUNTER — Other Ambulatory Visit

## 2024-07-28 ENCOUNTER — Inpatient Hospital Stay: Admission: RE | Admit: 2024-07-28 | Source: Ambulatory Visit

## 2024-08-02 ENCOUNTER — Inpatient Hospital Stay: Admission: RE | Admit: 2024-08-02 | Source: Ambulatory Visit

## 2024-08-02 ENCOUNTER — Other Ambulatory Visit

## 2024-08-04 ENCOUNTER — Ambulatory Visit
Admission: RE | Admit: 2024-08-04 | Discharge: 2024-08-04 | Disposition: A | Discharge status: Home / Self Care | Source: Ambulatory Visit | Attending: Pediatrics | Admitting: Pediatrics

## 2024-08-04 DIAGNOSIS — N63 Unspecified lump in unspecified breast: Secondary | ICD-10-CM | POA: Diagnosis present

## 2024-08-04 DIAGNOSIS — Z1231 Encounter for screening mammogram for malignant neoplasm of breast: Secondary | ICD-10-CM | POA: Diagnosis present
